# Patient Record
Sex: Female | Born: 1960
Health system: Southern US, Community
[De-identification: ages and names within clinical notes are randomized; demographics above are authoritative.]

## PROBLEM LIST (undated history)

## (undated) DIAGNOSIS — F17200 Nicotine dependence, unspecified, uncomplicated: Secondary | ICD-10-CM

## (undated) DIAGNOSIS — F419 Anxiety disorder, unspecified: Secondary | ICD-10-CM

## (undated) DIAGNOSIS — M545 Low back pain, unspecified: Secondary | ICD-10-CM

## (undated) DIAGNOSIS — G4730 Sleep apnea, unspecified: Secondary | ICD-10-CM

## (undated) DIAGNOSIS — M1990 Unspecified osteoarthritis, unspecified site: Secondary | ICD-10-CM

## (undated) DIAGNOSIS — E785 Hyperlipidemia, unspecified: Secondary | ICD-10-CM

## (undated) DIAGNOSIS — G8929 Other chronic pain: Secondary | ICD-10-CM

## (undated) DIAGNOSIS — I10 Essential (primary) hypertension: Secondary | ICD-10-CM

## (undated) DIAGNOSIS — K219 Gastro-esophageal reflux disease without esophagitis: Secondary | ICD-10-CM

## (undated) HISTORY — DX: Essential (primary) hypertension: I10

## (undated) HISTORY — PX: WISDOM TOOTH EXTRACTION: SHX21

## (undated) HISTORY — PX: TONSILLECTOMY: SUR1361

## (undated) HISTORY — PX: OTHER SURGICAL HISTORY: SHX169

## (undated) HISTORY — PX: BARTHOLIN GLAND CYST EXCISION: SHX565

## (undated) HISTORY — PX: BACK SURGERY: SHX140

## (undated) HISTORY — PX: NECK SURGERY: SHX720

## (undated) HISTORY — PX: SPINAL CORD DECOMPRESSION: SHX97

## (undated) HISTORY — DX: Hyperlipidemia, unspecified: E78.5

---

## 1999-01-06 ENCOUNTER — Ambulatory Visit (HOSPITAL_COMMUNITY): Admission: RE | Admit: 1999-01-06 | Discharge: 1999-01-06 | Payer: Self-pay | Admitting: Family Medicine

## 1999-08-04 ENCOUNTER — Encounter (INDEPENDENT_AMBULATORY_CARE_PROVIDER_SITE_OTHER): Payer: Self-pay

## 1999-08-04 ENCOUNTER — Observation Stay (HOSPITAL_COMMUNITY): Admission: RE | Admit: 1999-08-04 | Discharge: 1999-08-05 | Payer: Self-pay | Admitting: *Deleted

## 1999-12-25 ENCOUNTER — Other Ambulatory Visit: Admission: RE | Admit: 1999-12-25 | Discharge: 1999-12-25 | Payer: Self-pay | Admitting: Obstetrics and Gynecology

## 2000-01-20 ENCOUNTER — Encounter: Payer: Self-pay | Admitting: Family Medicine

## 2000-01-20 ENCOUNTER — Ambulatory Visit (HOSPITAL_COMMUNITY): Admission: RE | Admit: 2000-01-20 | Discharge: 2000-01-20 | Payer: Self-pay | Admitting: Family Medicine

## 2000-01-26 ENCOUNTER — Encounter: Payer: Self-pay | Admitting: Family Medicine

## 2000-01-26 ENCOUNTER — Encounter: Admission: RE | Admit: 2000-01-26 | Discharge: 2000-01-26 | Payer: Self-pay | Admitting: Family Medicine

## 2001-02-01 ENCOUNTER — Ambulatory Visit (HOSPITAL_COMMUNITY): Admission: RE | Admit: 2001-02-01 | Discharge: 2001-02-01 | Payer: Self-pay | Admitting: Family Medicine

## 2001-02-01 ENCOUNTER — Encounter: Payer: Self-pay | Admitting: Family Medicine

## 2001-11-01 ENCOUNTER — Other Ambulatory Visit: Admission: RE | Admit: 2001-11-01 | Discharge: 2001-11-01 | Payer: Self-pay | Admitting: Obstetrics and Gynecology

## 2002-02-02 ENCOUNTER — Ambulatory Visit (HOSPITAL_COMMUNITY): Admission: RE | Admit: 2002-02-02 | Discharge: 2002-02-02 | Payer: Self-pay | Admitting: Obstetrics and Gynecology

## 2002-02-02 ENCOUNTER — Encounter: Payer: Self-pay | Admitting: Obstetrics and Gynecology

## 2002-12-05 ENCOUNTER — Other Ambulatory Visit: Admission: RE | Admit: 2002-12-05 | Discharge: 2002-12-05 | Payer: Self-pay | Admitting: Obstetrics and Gynecology

## 2003-07-30 ENCOUNTER — Ambulatory Visit (HOSPITAL_COMMUNITY): Admission: RE | Admit: 2003-07-30 | Discharge: 2003-07-30 | Payer: Self-pay | Admitting: Obstetrics and Gynecology

## 2003-08-08 ENCOUNTER — Encounter: Admission: RE | Admit: 2003-08-08 | Discharge: 2003-08-08 | Payer: Self-pay | Admitting: Obstetrics and Gynecology

## 2003-12-27 ENCOUNTER — Ambulatory Visit (HOSPITAL_COMMUNITY): Admission: RE | Admit: 2003-12-27 | Discharge: 2003-12-27 | Payer: Self-pay | Admitting: Obstetrics

## 2004-08-14 ENCOUNTER — Ambulatory Visit (HOSPITAL_COMMUNITY): Admission: RE | Admit: 2004-08-14 | Discharge: 2004-08-14 | Payer: Self-pay | Admitting: Obstetrics

## 2005-08-16 ENCOUNTER — Ambulatory Visit (HOSPITAL_COMMUNITY): Admission: RE | Admit: 2005-08-16 | Discharge: 2005-08-16 | Payer: Self-pay | Admitting: Obstetrics

## 2006-04-19 ENCOUNTER — Ambulatory Visit (HOSPITAL_COMMUNITY): Admission: RE | Admit: 2006-04-19 | Discharge: 2006-04-19 | Payer: Self-pay | Admitting: Obstetrics

## 2006-08-22 ENCOUNTER — Ambulatory Visit (HOSPITAL_COMMUNITY): Admission: RE | Admit: 2006-08-22 | Discharge: 2006-08-22 | Payer: Self-pay | Admitting: Family Medicine

## 2006-11-14 ENCOUNTER — Ambulatory Visit (HOSPITAL_COMMUNITY): Admission: RE | Admit: 2006-11-14 | Discharge: 2006-11-14 | Payer: Self-pay | Admitting: Obstetrics

## 2007-11-17 ENCOUNTER — Ambulatory Visit (HOSPITAL_COMMUNITY): Admission: RE | Admit: 2007-11-17 | Discharge: 2007-11-17 | Payer: Self-pay | Admitting: Obstetrics

## 2008-08-29 ENCOUNTER — Encounter: Admission: RE | Admit: 2008-08-29 | Discharge: 2008-08-29 | Payer: Self-pay | Admitting: Neurosurgery

## 2008-11-18 ENCOUNTER — Ambulatory Visit (HOSPITAL_COMMUNITY): Admission: RE | Admit: 2008-11-18 | Discharge: 2008-11-18 | Payer: Self-pay | Admitting: Obstetrics

## 2009-11-19 ENCOUNTER — Ambulatory Visit (HOSPITAL_COMMUNITY): Admission: RE | Admit: 2009-11-19 | Discharge: 2009-11-19 | Payer: Self-pay | Admitting: Obstetrics

## 2009-11-25 ENCOUNTER — Encounter: Admission: RE | Admit: 2009-11-25 | Discharge: 2009-11-25 | Payer: Self-pay | Admitting: Obstetrics

## 2010-09-11 ENCOUNTER — Ambulatory Visit (HOSPITAL_COMMUNITY)
Admission: RE | Admit: 2010-09-11 | Discharge: 2010-09-11 | Payer: Self-pay | Source: Home / Self Care | Attending: Obstetrics | Admitting: Obstetrics

## 2010-10-18 ENCOUNTER — Encounter: Payer: Self-pay | Admitting: Obstetrics

## 2010-11-18 ENCOUNTER — Other Ambulatory Visit: Payer: Self-pay

## 2010-11-18 ENCOUNTER — Other Ambulatory Visit: Payer: Self-pay | Admitting: *Deleted

## 2010-11-18 DIAGNOSIS — Z1231 Encounter for screening mammogram for malignant neoplasm of breast: Secondary | ICD-10-CM

## 2010-11-27 ENCOUNTER — Ambulatory Visit (HOSPITAL_COMMUNITY)
Admission: RE | Admit: 2010-11-27 | Discharge: 2010-11-27 | Disposition: A | Discharge status: Home / Self Care | Payer: BC Managed Care – HMO | Source: Ambulatory Visit | Attending: Obstetrics | Admitting: Obstetrics

## 2010-11-27 ENCOUNTER — Ambulatory Visit: Payer: Self-pay

## 2010-11-27 DIAGNOSIS — Z1231 Encounter for screening mammogram for malignant neoplasm of breast: Secondary | ICD-10-CM | POA: Insufficient documentation

## 2010-12-07 LAB — CBC
HCT: 43.1 % (ref 36.0–46.0)
Hemoglobin: 15.1 g/dL — ABNORMAL HIGH (ref 12.0–15.0)
MCH: 33.1 pg (ref 26.0–34.0)
MCHC: 35 g/dL (ref 30.0–36.0)
MCV: 94.5 fL (ref 78.0–100.0)
Platelets: 313 10*3/uL (ref 150–400)
RBC: 4.56 MIL/uL (ref 3.87–5.11)
RDW: 11.7 % (ref 11.5–15.5)
WBC: 7.6 10*3/uL (ref 4.0–10.5)

## 2010-12-07 LAB — PREGNANCY, URINE: Preg Test, Ur: NEGATIVE

## 2011-10-26 ENCOUNTER — Other Ambulatory Visit (HOSPITAL_COMMUNITY): Payer: Self-pay | Admitting: Internal Medicine

## 2011-10-26 DIAGNOSIS — Z1231 Encounter for screening mammogram for malignant neoplasm of breast: Secondary | ICD-10-CM

## 2011-11-29 ENCOUNTER — Ambulatory Visit (HOSPITAL_COMMUNITY)
Admission: RE | Admit: 2011-11-29 | Discharge: 2011-11-29 | Disposition: A | Discharge status: Home / Self Care | Payer: BC Managed Care – HMO | Source: Ambulatory Visit | Attending: Internal Medicine | Admitting: Internal Medicine

## 2011-11-29 DIAGNOSIS — Z1231 Encounter for screening mammogram for malignant neoplasm of breast: Secondary | ICD-10-CM

## 2013-01-29 ENCOUNTER — Other Ambulatory Visit (HOSPITAL_COMMUNITY): Payer: Self-pay | Admitting: Internal Medicine

## 2013-01-29 DIAGNOSIS — Z1231 Encounter for screening mammogram for malignant neoplasm of breast: Secondary | ICD-10-CM

## 2013-02-06 ENCOUNTER — Ambulatory Visit (HOSPITAL_COMMUNITY)
Admission: RE | Admit: 2013-02-06 | Discharge: 2013-02-06 | Disposition: A | Discharge status: Home / Self Care | Payer: BC Managed Care – HMO | Source: Ambulatory Visit | Attending: Internal Medicine | Admitting: Internal Medicine

## 2013-02-06 DIAGNOSIS — Z1231 Encounter for screening mammogram for malignant neoplasm of breast: Secondary | ICD-10-CM | POA: Insufficient documentation

## 2013-03-03 ENCOUNTER — Ambulatory Visit (INDEPENDENT_AMBULATORY_CARE_PROVIDER_SITE_OTHER): Payer: BC Managed Care – PPO | Admitting: Family Medicine

## 2013-03-03 VITALS — BP 156/91 | HR 63 | Temp 98.2°F | Resp 16 | Ht 64.0 in | Wt 129.6 lb

## 2013-03-03 DIAGNOSIS — S61209A Unspecified open wound of unspecified finger without damage to nail, initial encounter: Secondary | ICD-10-CM

## 2013-03-03 DIAGNOSIS — M79609 Pain in unspecified limb: Secondary | ICD-10-CM

## 2013-03-03 DIAGNOSIS — M79646 Pain in unspecified finger(s): Secondary | ICD-10-CM | POA: Insufficient documentation

## 2013-03-03 DIAGNOSIS — S61219A Laceration without foreign body of unspecified finger without damage to nail, initial encounter: Secondary | ICD-10-CM | POA: Insufficient documentation

## 2013-03-03 NOTE — Progress Notes (Signed)
[facility] [address], [place] [address] [phone]  Date:  03/03/2013   Name:  Felicia Wilcox   DOB:  1961/09/23   MRN:  [id number]  PCP:  No PCP Per Patient    Chief Complaint: Laceration   History of Present Illness:  Felicia Wilcox is a 52 y.o. very pleasant female patient who presents with the following:  She stuck her hand into a drawer and cut her left ring finger on a cheese grater.  She is otherwise generally healthy and unhurt, her last tetanus shot was about 2 years ago.  She does have a PCP who is following her BP, no HA or CP  Patient Active Problem List   Diagnosis Date Noted  . Laceration of finger 03/03/2013  . Pain in finger 03/03/2013    No past medical history on file.  No past surgical history on file.  History  Substance Use Topics  . Smoking status: Not on file  . Smokeless tobacco: Not on file  . Alcohol Use: Not on file    No family history on file.  Allergies  Allergen Reactions  . Codeine   . Sulfa Antibiotics     Medication list has been reviewed and updated.  No current outpatient prescriptions on file prior to visit.   No current facility-administered medications on file prior to visit.    Review of Systems:  As per HPI- otherwise negative.   Physical Examination: Filed Vitals:   03/03/13 1849  BP: 156/91  Pulse: 63  Temp: 98.2 F (36.8 C)  Resp: 16   Filed Vitals:   03/03/13 1849  Height: 5\' 4"  (1.626 m)  Weight: 129 lb 9.6 oz (58.786 kg)   Body mass index is 22.23 kg/(m^2). Ideal Body Weight: Weight in (lb) to have BMI = 25: 145.3   GEN: WDWN, NAD, Non-toxic, Alert & Oriented x 3 HEENT: Atraumatic, Normocephalic.  Ears and Nose: No external deformity. EXTR: No clubbing/cyanosis/edema NEURO: Normal gait.  PSYCH: Normally interactive. Conversant. Not depressed or anxious appearing.  Calm demeanor.  Left ring finger: there is a clean, straight laceration lateral to the nail on the  lateral finger. Normal finger sensation on all sides, normal strength and ROM  Assessment and Plan: Laceration of finger, initial encounter  Pain in finger, left  Laceration of finger, repaired as per Rhoderick Moody, PA-C.  Wound dressed, she will RTC for SR or sooner if needed for any sign of infection or other complication.   Signed Abbe Amsterdam, MD

## 2013-03-03 NOTE — Patient Instructions (Signed)

## 2013-03-03 NOTE — Progress Notes (Signed)
Patient ID: TREZURE CRONK MRN: [id number], DOB: 05/09/1961, 52 y.o. Date of Encounter: 03/03/2013, 7:05 PM   PROCEDURE NOTE: Verbal consent obtained. Sterile technique employed. Numbing: Anesthesia obtained with 2% lidocaine plain.   Cleansed with soap and water. Irrigated.  Wound explored, no deep structures involved, no foreign bodies.   Wound repaired with # 6 SI sutures using 5-0 ethilon.  Hemostasis obtained. Wound cleansed and dressed.  Wound care instructions including precautions covered with patient. Handout given.  Anticipate suture removal in 7-10 days  Rhoderick Moody, PA-C 03/03/2013 7:05 PM

## 2013-03-06 ENCOUNTER — Telehealth: Payer: Self-pay | Admitting: *Deleted

## 2013-03-06 NOTE — Telephone Encounter (Signed)
Patient called and left voice message requesting a oral rx  For bacterial infection. She states there was some improvement with the rx for the cream and she does not want a rx for oral medication. Her last office visit was with Almyra Free in April of 2013 she is scheduled for a annual exam on 7/3/204 @ 1:30 with Dr Clearance Coots. Please advise.

## 2013-03-06 NOTE — Telephone Encounter (Signed)
Patient called and left voice message requesting a oral rx for bacterial inection. She states the previous rx provided in

## 2013-03-08 NOTE — Telephone Encounter (Signed)
SEE CALL NOTE.

## 2013-03-09 NOTE — Telephone Encounter (Signed)
OK to refill

## 2013-03-13 ENCOUNTER — Ambulatory Visit (INDEPENDENT_AMBULATORY_CARE_PROVIDER_SITE_OTHER): Payer: BC Managed Care – PPO | Admitting: Emergency Medicine

## 2013-03-13 VITALS — Ht 64.0 in | Wt 124.0 lb

## 2013-03-13 DIAGNOSIS — Z4889 Encounter for other specified surgical aftercare: Secondary | ICD-10-CM

## 2013-03-13 NOTE — Progress Notes (Signed)
[facility] [address], [place] [address] [phone]  Date:  03/13/2013   Name:  Felicia Wilcox   DOB:  1961/09/13   MRN:  [id number]  PCP:  No PCP Per Patient    Chief Complaint: Suture / Staple Removal   History of Present Illness:  Felicia Wilcox is a 52 y.o. very pleasant female patient who presents with the following:  Sutured left second finger a week ago.  Interval history is negative.  Denies other complaint or health concern today.   Patient Active Problem List   Diagnosis Date Noted  . Laceration of finger 03/03/2013  . Pain in finger 03/03/2013    No past medical history on file.  No past surgical history on file.  History  Substance Use Topics  . Smoking status: Not on file  . Smokeless tobacco: Not on file  . Alcohol Use: Not on file    No family history on file.  Allergies  Allergen Reactions  . Codeine   . Sulfa Antibiotics     Medication list has been reviewed and updated.  Current Outpatient Prescriptions on File Prior to Visit  Medication Sig Dispense Refill  . Escitalopram Oxalate (LEXAPRO PO) Take 1 tablet by mouth daily.       No current facility-administered medications on file prior to visit.    Review of Systems:  As per HPI, otherwise negative.    Physical Examination: There were no vitals filed for this visit. Filed Vitals:   03/13/13 1053  Height: 5\' 4"  (1.626 m)  Weight: 124 lb (56.246 kg)   Body mass index is 21.27 kg/(m^2). Ideal Body Weight: Weight in (lb) to have BMI = 25: 145.3   GEN: WDWN, NAD, Non-toxic, Alert & Oriented x 3 HEENT: Atraumatic, Normocephalic.  Ears and Nose: No external deformity. EXTR: No clubbing/cyanosis/edema NEURO: Normal gait.  PSYCH: Normally interactive. Conversant. Not depressed or anxious appearing.  Calm demeanor.  SKIN:  Wound healed.  NATI  Assessment and Plan: Suture removal Follow up as needed    Signed,  Phillips Odor, MD

## 2013-03-29 ENCOUNTER — Ambulatory Visit: Payer: Self-pay | Admitting: Obstetrics

## 2013-05-17 ENCOUNTER — Ambulatory Visit (INDEPENDENT_AMBULATORY_CARE_PROVIDER_SITE_OTHER): Payer: BC Managed Care – PPO | Admitting: Obstetrics

## 2013-05-17 ENCOUNTER — Encounter: Payer: Self-pay | Admitting: Obstetrics

## 2013-05-17 VITALS — BP 147/91 | HR 77 | Temp 99.0°F | Ht 63.0 in | Wt 128.2 lb

## 2013-05-17 DIAGNOSIS — N760 Acute vaginitis: Secondary | ICD-10-CM | POA: Insufficient documentation

## 2013-05-17 DIAGNOSIS — Z01419 Encounter for gynecological examination (general) (routine) without abnormal findings: Secondary | ICD-10-CM

## 2013-05-17 DIAGNOSIS — Z0000 Encounter for general adult medical examination without abnormal findings: Secondary | ICD-10-CM

## 2013-05-17 NOTE — Addendum Note (Signed)
Addended by: Elby Beck F on: 05/17/2013 03:54 PM   Modules accepted: Orders

## 2013-05-17 NOTE — Progress Notes (Signed)
Subjective:     Felicia Wilcox is a 52 y.o. female here for a routine exam.  Current complaints: annual exam. Pt states she is getting reoccurring bacterial infections.    Personal health questionnaire reviewed: yes.   Gynecologic History No LMP recorded. Patient is postmenopausal. Contraception: abstinence Last Pap: 12/2011. Results were: normal Last mammogram: 12/2012. Results were: normal  Obstetric History OB History  No data available     The following portions of the patient's history were reviewed and updated as appropriate: allergies, current medications, past family history, past medical history, past social history, past surgical history and problem list.  Review of Systems Pertinent items are noted in HPI.    Objective:    General appearance: alert and no distress Breasts: normal appearance, no masses or tenderness, Inspection negative, Normal to palpation without dominant masses, Taught monthly breast self examination Abdomen: normal findings: soft, non-tender Pelvic: cervix normal in appearance, external genitalia normal, no adnexal masses or tenderness, no cervical motion tenderness, uterus normal size, shape, and consistency and vagina normal without discharge    Assessment:    Healthy female exam.    Plan:    Follow up in: 1 year.  \

## 2013-05-18 LAB — WET PREP BY MOLECULAR PROBE
Candida species: NEGATIVE
Gardnerella vaginalis: NEGATIVE
Trichomonas vaginosis: NEGATIVE

## 2013-05-18 LAB — PAP IG W/ RFLX HPV ASCU

## 2014-01-10 ENCOUNTER — Encounter: Payer: Self-pay | Admitting: Obstetrics

## 2014-02-22 ENCOUNTER — Telehealth: Payer: Self-pay | Admitting: *Deleted

## 2014-02-22 NOTE — Telephone Encounter (Signed)
Patient called stating that she has a bacterial infection. Patient states last time she was treated with an antibiotic that she did not do well with. Patient state she would like a vaginal cream sent to [facility] on [address] [address]. Patient states she is leaving to go out of town Sunday.   Tried to contact the patient and was sent to voicemail, left a message for patient to contact the office.

## 2014-02-26 NOTE — Telephone Encounter (Signed)
Patient never called the office. Patient should be out of town now. Call refilled.

## 2014-03-12 ENCOUNTER — Telehealth: Payer: Self-pay | Admitting: *Deleted

## 2014-03-12 DIAGNOSIS — N760 Acute vaginitis: Principal | ICD-10-CM

## 2014-03-12 DIAGNOSIS — B9689 Other specified bacterial agents as the cause of diseases classified elsewhere: Secondary | ICD-10-CM

## 2014-03-12 MED ORDER — METRONIDAZOLE 0.75 % VA GEL: 1.0000 | Freq: Two times a day (BID) | VAGINAL | Status: DC

## 2014-03-12 NOTE — Telephone Encounter (Signed)
Patient state she has chronic BV problems and her doctor is aware. Patient is requesting vaginal treatment as she can not take the normal oral medications. Patient states she called 2 weeks ago and did not get a call back. Rx escibed to pharmacy per Dr Clearance CootsHarper.

## 2014-08-08 ENCOUNTER — Encounter: Payer: Self-pay | Admitting: Obstetrics & Gynecology

## 2014-08-08 ENCOUNTER — Ambulatory Visit (INDEPENDENT_AMBULATORY_CARE_PROVIDER_SITE_OTHER): Payer: BC Managed Care – PPO | Admitting: Obstetrics & Gynecology

## 2014-08-08 VITALS — BP 143/98 | HR 93 | Wt 133.0 lb

## 2014-08-08 DIAGNOSIS — Z01419 Encounter for gynecological examination (general) (routine) without abnormal findings: Secondary | ICD-10-CM

## 2014-08-08 NOTE — Progress Notes (Signed)
Subjective:     Felicia BangsCheryl M Wilcox is a 53 y.o. female here for a routine exam.    Personal health questionnaire:  Is patient Ashkenazi Jewish, have a family history of breast and/or ovarian cancer: no Is there a family history of uterine cancer diagnosed at age < 4850, gastrointestinal cancer, urinary tract cancer, family member who is a Personnel officerLynch syndrome-associated carrier: no Is the patient overweight and hypertensive, family history of diabetes, personal history of gestational diabetes or PCOS: yes Is patient over 2555, have PCOS,  family history of premature CHD under age 55, diabetes, smoke, have hypertension or peripheral artery disease:  yes At any time, has a partner hit, kicked or otherwise hurt or frightened you?: no Over the past 2 weeks, have you felt down, depressed or hopeless?: yes Over the past 2 weeks, have you felt little interest or pleasure in doing things?:sometimes   Gynecologic History No LMP recorded. Patient is postmenopausal.  Last Pap: 2014. Results were: normal Last mammogram: 2014. Results were: normal  Obstetric History OB History  No data available    History reviewed. No pertinent past medical history.  Past Surgical History  Procedure Laterality Date  . Abalation     . Neck surgery      Current outpatient prescriptions: buPROPion (WELLBUTRIN XL) 150 MG 24 hr tablet, Take 150 mg by mouth daily., Disp: , Rfl: ;  Escitalopram Oxalate (LEXAPRO PO), Take 1 tablet by mouth daily., Disp: , Rfl: ;  metroNIDAZOLE (METROGEL VAGINAL) 0.75 % vaginal gel, Place 1 Applicatorful vaginally 2 (two) times daily., Disp: 70 g, Rfl: 2 Allergies  Allergen Reactions  . Codeine   . Sulfa Antibiotics     History  Substance Use Topics  . Smoking status: Current Every Day Smoker -- 0.50 packs/day  . Smokeless tobacco: Not on file  . Alcohol Use: No    Family History  Problem Relation Age of Onset  . Heart disease Mother   . Cancer Father   . Diabetes Maternal Grandmother        Review of Systems  Constitutional: negative for fatigue and weight loss Respiratory: negative for cough and wheezing Cardiovascular: negative for chest pain, fatigue and palpitations Gastrointestinal: negative for abdominal pain and change in bowel habits Musculoskeletal:negative for myalgias Neurological: negative for gait problems and tremors Behavioral/Psych: negative for abusive relationship, depression Endocrine: negative for temperature intolerance   Genitourinary:negative for abnormal menstrual periods, genital lesions, hot flashes, sexual problems and vaginal discharge Integument/breast: negative for breast lump, breast tenderness, nipple discharge and skin lesion(s)    Objective:       BP 143/98 mmHg  Pulse 93  Wt 60.328 kg (133 lb) General:   alert  Skin:   no rash or abnormalities  Lungs:   clear to auscultation bilaterally  Heart:   regular rate and rhythm, S1, S2 normal, no murmur, click, rub or gallop  Breasts:   normal without suspicious masses, skin or nipple changes or axillary nodes  Abdomen:  normal findings: no organomegaly, soft, non-tender and no hernia  Pelvis:  External genitalia: normal general appearance Urinary system: urethral meatus normal and bladder without fullness, nontender Vaginal: normal without tenderness, induration or masses Cervix: normal appearance Adnexa: normal bimanual exam Uterus: anteverted and non-tender, normal size   Lab Review Urine pregnancy test Labs reviewed: no Radiologic studies reviewed: yes    Assessment:    Healthy female exam.    Plan:    Education reviewed: calcium supplements, low fat, low cholesterol diet  and weight bearing exercise.   Follow up as needed.

## 2014-08-10 LAB — PAP IG, CT-NG, RFX HPV ASCU
Chlamydia Probe Amp: NEGATIVE
GC Probe Amp: NEGATIVE

## 2014-08-14 NOTE — Patient Instructions (Signed)
Bone Health Our bones do many things. They provide structure, protect organs, anchor muscles, and store calcium. Adequate calcium in your diet and weight-bearing physical activity help build strong bones, improve bone amounts, and may reduce the risk of weakening of bones (osteoporosis) later in life. PEAK BONE MASS By age 55, the average woman has acquired most of her skeletal bone mass. A large decline occurs in older adults which increases the risk of osteoporosis. In women this occurs around the time of menopause. It is important for young girls to reach their peak bone mass in order to maintain bone health throughout life. A person with high bone mass as a young adult will be more likely to have a higher bone mass later in life. Not enough calcium consumption and physical activity early on could result in a failure to achieve optimum bone mass in adulthood. OSTEOPOROSIS Osteoporosis is a disease of the bones. It is defined as low bone mass with deterioration of bone structure. Osteoporosis leads to an increase risk of fractures with falls. These fractures commonly happen in the wrist, hip, and spine. While men and women of all ages and background can develop osteoporosis, some of the risk factors for osteoporosis are:  Female.  White.  Postmenopausal.  Older adults.  Small in body size.  Eating a diet low in calcium.  Physically inactive.  Smoking.  Use of some medications.  Family history. CALCIUM Calcium is a mineral needed by the body for healthy bones, teeth, and proper function of the heart, muscles, and nerves. The body cannot produce calcium so it must be absorbed through food. Good sources of calcium include:  Dairy products (low fat or nonfat milk, cheese, and yogurt).  Dark green leafy vegetables (bok choy and broccoli).  Calcium fortified foods (orange juice, cereal, bread, soy beverages, and tofu products).  Nuts (almonds). Recommended amounts of calcium vary  for individuals. RECOMMENDED CALCIUM INTAKES Age and Amount in mg per day  Children 1 to 3 years / 700 mg  Children 4 to 8 years / 1,000 mg  Children 9 to 13 years / 1,300 mg  Teens 14 to 18 years / 1,300 mg  Adults 19 to 50 years / 1,000 mg  Adult women 51 to 70 years / 1,200 mg  Adults 71 years and older / 1,200 mg  Pregnant and breastfeeding teens / 1,300 mg  Pregnant and breastfeeding adults / 1,000 mg Vitamin D also plays an important role in healthy bone development. Vitamin D helps in the absorption of calcium. WEIGHT-BEARING PHYSICAL ACTIVITY Regular physical activity has many positive health benefits. Benefits include strong bones. Weight-bearing physical activity early in life is important in reaching peak bone mass. Weight-bearing physical activities cause muscles and bones to work against gravity. Some examples of weight bearing physical activities include:  Walking, jogging, or running.  Field Hockey.  Jumping rope.  Dancing.  Soccer.  Tennis or Racquetball.  Stair climbing.  Basketball.  Hiking.  Weight lifting.  Aerobic fitness classes. Including weight-bearing physical activity into an exercise plan is a great way to keep bones healthy. Adults: Engage in at least 30 minutes of moderate physical activity on most, preferably all, days of the week. Children: Engage in at least 60 minutes of moderate physical activity on most, preferably all, days of the week. FOR MORE INFORMATION [place] Department of Agriculture, Center for Nutrition Policy and Promotion: www.cnpp.usda.gov National Osteoporosis Foundation: www.nof.org Document Released: 12/04/2003 Document Revised: 01/08/2013 Document Reviewed: 03/05/2009 [employer] Patient Information   2015 [employer]. This information is not intended to replace advice given to you by your health care provider. Make sure you discuss any questions you have with your health care provider. Health  Maintenance Adopting a healthy lifestyle and getting preventive care can go a long way to promote health and wellness. Talk with your health care provider about what schedule of regular examinations is right for you. This is a good chance for you to check in with your provider about disease prevention and staying healthy. In between checkups, there are plenty of things you can do on your own. Experts have done a lot of research about which lifestyle changes and preventive measures are most likely to keep you healthy. Ask your health care provider for more information. WEIGHT AND DIET  Eat a healthy diet  Be sure to include plenty of vegetables, fruits, low-fat dairy products, and lean protein.  Do not eat a lot of foods high in solid fats, added sugars, or salt.  Get regular exercise. This is one of the most important things you can do for your health.  Most adults should exercise for at least 150 minutes each week. The exercise should increase your heart rate and make you sweat (moderate-intensity exercise).  Most adults should also do strengthening exercises at least twice a week. This is in addition to the moderate-intensity exercise.  Maintain a healthy weight  Body mass index (BMI) is a measurement that can be used to identify possible weight problems. It estimates body fat based on height and weight. Your health care provider can help determine your BMI and help you achieve or maintain a healthy weight.  For females 55 years of age and older:   A BMI below 18.5 is considered underweight.  A BMI of 18.5 to 24.9 is normal.  A BMI of 25 to 29.9 is considered overweight.  A BMI of 30 and above is considered obese.  Watch levels of cholesterol and blood lipids  You should start having your blood tested for lipids and cholesterol at 55 years of age, then have this test every 5 years.  You may need to have your cholesterol levels checked more often if:  Your lipid or cholesterol  levels are high.  You are older than 55 years of age.  You are at high risk for heart disease.  CANCER SCREENING   Lung Cancer  Lung cancer screening is recommended for adults 55-80 years old who are at high risk for lung cancer because of a history of smoking.  A yearly low-dose CT scan of the lungs is recommended for people who:  Currently smoke.  Have quit within the past 15 years.  Have at least a 30-pack-year history of smoking. A pack year is smoking an average of one pack of cigarettes a day for 1 year.  Yearly screening should continue until it has been 15 years since you quit.  Yearly screening should stop if you develop a health problem that would prevent you from having lung cancer treatment.  Breast Cancer  Practice breast self-awareness. This means understanding how your breasts normally appear and feel.  It also means doing regular breast self-exams. Let your health care provider know about any changes, no matter how small.  If you are in your 20s or 30s, you should have a clinical breast exam (CBE) by a health care provider every 1-3 years as part of a regular health exam.  If you are 40 or older, have a CBE every   year. Also consider having a breast X-ray (mammogram) every year.  If you have a family history of breast cancer, talk to your health care provider about genetic screening.  If you are at high risk for breast cancer, talk to your health care provider about having an MRI and a mammogram every year.  Breast cancer gene (BRCA) assessment is recommended for women who have family members with BRCA-related cancers. BRCA-related cancers include:  Breast.  Ovarian.  Tubal.  Peritoneal cancers.  Results of the assessment will determine the need for genetic counseling and BRCA1 and BRCA2 testing. Cervical Cancer Routine pelvic examinations to screen for cervical cancer are no longer recommended for nonpregnant women who are considered low risk for  cancer of the pelvic organs (ovaries, uterus, and vagina) and who do not have symptoms. A pelvic examination may be necessary if you have symptoms including those associated with pelvic infections. Ask your health care provider if a screening pelvic exam is right for you.   The Pap test is the screening test for cervical cancer for women who are considered at risk.  If you had a hysterectomy for a problem that was not cancer or a condition that could lead to cancer, then you no longer need Pap tests.  If you are older than 65 years, and you have had normal Pap tests for the past 10 years, you no longer need to have Pap tests.  If you have had past treatment for cervical cancer or a condition that could lead to cancer, you need Pap tests and screening for cancer for at least 20 years after your treatment.  If you no longer get a Pap test, assess your risk factors if they change (such as having a new sexual partner). This can affect whether you should start being screened again.  Some women have medical problems that increase their chance of getting cervical cancer. If this is the case for you, your health care provider may recommend more frequent screening and Pap tests.  The human papillomavirus (HPV) test is another test that may be used for cervical cancer screening. The HPV test looks for the virus that can cause cell changes in the cervix. The cells collected during the Pap test can be tested for HPV.  The HPV test can be used to screen women 30 years of age and older. Getting tested for HPV can extend the interval between normal Pap tests from three to five years.  An HPV test also should be used to screen women of any age who have unclear Pap test results.  After 55 years of age, women should have HPV testing as often as Pap tests.  Colorectal Cancer  This type of cancer can be detected and often prevented.  Routine colorectal cancer screening usually begins at 55 years of age and  continues through 55 years of age.  Your health care provider may recommend screening at an earlier age if you have risk factors for colon cancer.  Your health care provider may also recommend using home test kits to check for hidden blood in the stool.  A small camera at the end of a tube can be used to examine your colon directly (sigmoidoscopy or colonoscopy). This is done to check for the earliest forms of colorectal cancer.  Routine screening usually begins at age 50.  Direct examination of the colon should be repeated every 5-10 years through 55 years of age. However, you may need to be screened more often if early   forms of precancerous polyps or small growths are found. Skin Cancer  Check your skin from head to toe regularly.  Tell your health care provider about any new moles or changes in moles, especially if there is a change in a mole's shape or color.  Also tell your health care provider if you have a mole that is larger than the size of a pencil eraser.  Always use sunscreen. Apply sunscreen liberally and repeatedly throughout the day.  Protect yourself by wearing long sleeves, pants, a wide-brimmed hat, and sunglasses whenever you are outside. HEART DISEASE, DIABETES, AND HIGH BLOOD PRESSURE   Have your blood pressure checked at least every 1-2 years. High blood pressure causes heart disease and increases the risk of stroke.  If you are between 55 years and 79 years old, ask your health care provider if you should take aspirin to prevent strokes.  Have regular diabetes screenings. This involves taking a blood sample to check your fasting blood sugar level.  If you are at a normal weight and have a low risk for diabetes, have this test once every three years after 55 years of age.  If you are overweight and have a high risk for diabetes, consider being tested at a younger age or more often. PREVENTING INFECTION  Hepatitis B  If you have a higher risk for hepatitis B,  you should be screened for this virus. You are considered at high risk for hepatitis B if:  You were born in a country where hepatitis B is common. Ask your health care provider which countries are considered high risk.  Your parents were born in a high-risk country, and you have not been immunized against hepatitis B (hepatitis B vaccine).  You have HIV or AIDS.  You use needles to inject street drugs.  You live with someone who has hepatitis B.  You have had sex with someone who has hepatitis B.  You get hemodialysis treatment.  You take certain medicines for conditions, including cancer, organ transplantation, and autoimmune conditions. Hepatitis C  Blood testing is recommended for:  Everyone born from 1945 through 1965.  Anyone with known risk factors for hepatitis C. Sexually transmitted infections (STIs)  You should be screened for sexually transmitted infections (STIs) including gonorrhea and chlamydia if:  You are sexually active and are younger than 55 years of age.  You are older than 55 years of age and your health care provider tells you that you are at risk for this type of infection.  Your sexual activity has changed since you were last screened and you are at an increased risk for chlamydia or gonorrhea. Ask your health care provider if you are at risk.  If you do not have HIV, but are at risk, it may be recommended that you take a prescription medicine daily to prevent HIV infection. This is called pre-exposure prophylaxis (PrEP). You are considered at risk if:  You are sexually active and do not regularly use condoms or know the HIV status of your partner(s).  You take drugs by injection.  You are sexually active with a partner who has HIV. Talk with your health care provider about whether you are at high risk of being infected with HIV. If you choose to begin PrEP, you should first be tested for HIV. You should then be tested every 3 months for as long as  you are taking PrEP.  PREGNANCY   If you are premenopausal and you may become pregnant, ask your   health care provider about preconception counseling.  If you may become pregnant, take 400 to 800 micrograms (mcg) of folic acid every day.  If you want to prevent pregnancy, talk to your health care provider about birth control (contraception). OSTEOPOROSIS AND MENOPAUSE   Osteoporosis is a disease in which the bones lose minerals and strength with aging. This can result in serious bone fractures. Your risk for osteoporosis can be identified using a bone density scan.  If you are 51 years of age or older, or if you are at risk for osteoporosis and fractures, ask your health care provider if you should be screened.  Ask your health care provider whether you should take a calcium or vitamin D supplement to lower your risk for osteoporosis.  Menopause may have certain physical symptoms and risks.  Hormone replacement therapy may reduce some of these symptoms and risks. Talk to your health care provider about whether hormone replacement therapy is right for you.  HOME CARE INSTRUCTIONS   Schedule regular health, dental, and eye exams.  Stay current with your immunizations.   Do not use any tobacco products including cigarettes, chewing tobacco, or electronic cigarettes.  If you are pregnant, do not drink alcohol.  If you are breastfeeding, limit how much and how often you drink alcohol.  Limit alcohol intake to no more than 1 drink per day for nonpregnant women. One drink equals 12 ounces of beer, 5 ounces of wine, or 1 ounces of hard liquor.  Do not use street drugs.  Do not share needles.  Ask your health care provider for help if you need support or information about quitting drugs.  Tell your health care provider if you often feel depressed.  Tell your health care provider if you have ever been abused or do not feel safe at home. Document Released: 03/29/2011 Document  Revised: 01/28/2014 Document Reviewed: 08/15/2013 [facility] Patient Information 2015 [place], [place]. This information is not intended to replace advice given to you by your health care provider. Make sure you discuss any questions you have with your health care provider.

## 2014-08-23 ENCOUNTER — Telehealth: Payer: Self-pay | Admitting: *Deleted

## 2014-08-23 NOTE — Telephone Encounter (Signed)
Patient interested in an IUD Removal. Patient scheduled for 08-28-14 @ 4:15p.

## 2014-08-28 ENCOUNTER — Ambulatory Visit: Payer: Self-pay | Admitting: Obstetrics

## 2014-08-30 ENCOUNTER — Ambulatory Visit: Payer: Self-pay | Admitting: Obstetrics

## 2014-09-02 ENCOUNTER — Telehealth: Payer: Self-pay | Admitting: *Deleted

## 2014-09-02 NOTE — Telephone Encounter (Signed)
Patient called to reschedule her IUD Removal. Patient was scheduled for 09-16-14 @ 4:15p.

## 2014-09-06 ENCOUNTER — Ambulatory Visit: Payer: BC Managed Care – PPO | Admitting: Obstetrics

## 2014-09-11 ENCOUNTER — Encounter: Payer: Self-pay | Admitting: Obstetrics & Gynecology

## 2014-09-11 ENCOUNTER — Other Ambulatory Visit: Payer: Self-pay | Admitting: *Deleted

## 2014-09-11 ENCOUNTER — Ambulatory Visit (INDEPENDENT_AMBULATORY_CARE_PROVIDER_SITE_OTHER): Payer: BC Managed Care – PPO | Admitting: Obstetrics & Gynecology

## 2014-09-11 VITALS — BP 152/95 | HR 90 | Temp 99.3°F | Ht 63.0 in | Wt 135.0 lb

## 2014-09-11 DIAGNOSIS — Z30432 Encounter for removal of intrauterine contraceptive device: Secondary | ICD-10-CM

## 2014-09-11 DIAGNOSIS — N882 Stricture and stenosis of cervix uteri: Secondary | ICD-10-CM

## 2014-09-11 MED ORDER — MISOPROSTOL 200 MCG PO TABS: 200.0000 ug | ORAL_TABLET | Freq: Once | ORAL | Status: DC

## 2014-09-12 ENCOUNTER — Encounter (HOSPITAL_COMMUNITY): Payer: Self-pay | Admitting: *Deleted

## 2014-09-13 ENCOUNTER — Ambulatory Visit (HOSPITAL_COMMUNITY): Payer: BC Managed Care – PPO

## 2014-09-13 ENCOUNTER — Encounter (HOSPITAL_COMMUNITY)
Admission: RE | Disposition: A | Discharge status: Home / Self Care | Payer: Self-pay | Source: Ambulatory Visit | Attending: Obstetrics & Gynecology

## 2014-09-13 ENCOUNTER — Ambulatory Visit (HOSPITAL_COMMUNITY): Payer: BC Managed Care – PPO | Admitting: Anesthesiology

## 2014-09-13 ENCOUNTER — Ambulatory Visit (HOSPITAL_COMMUNITY)
Admission: RE | Admit: 2014-09-13 | Discharge: 2014-09-13 | Disposition: A | Discharge status: Home / Self Care | Payer: BC Managed Care – PPO | Source: Ambulatory Visit | Attending: Obstetrics & Gynecology | Admitting: Obstetrics & Gynecology

## 2014-09-13 DIAGNOSIS — M1990 Unspecified osteoarthritis, unspecified site: Secondary | ICD-10-CM | POA: Insufficient documentation

## 2014-09-13 DIAGNOSIS — K219 Gastro-esophageal reflux disease without esophagitis: Secondary | ICD-10-CM | POA: Diagnosis not present

## 2014-09-13 DIAGNOSIS — G8929 Other chronic pain: Secondary | ICD-10-CM | POA: Diagnosis not present

## 2014-09-13 DIAGNOSIS — Z30432 Encounter for removal of intrauterine contraceptive device: Secondary | ICD-10-CM | POA: Diagnosis present

## 2014-09-13 DIAGNOSIS — Z975 Presence of (intrauterine) contraceptive device: Secondary | ICD-10-CM | POA: Diagnosis not present

## 2014-09-13 DIAGNOSIS — F17210 Nicotine dependence, cigarettes, uncomplicated: Secondary | ICD-10-CM | POA: Diagnosis not present

## 2014-09-13 DIAGNOSIS — M545 Low back pain: Secondary | ICD-10-CM | POA: Diagnosis not present

## 2014-09-13 DIAGNOSIS — G4730 Sleep apnea, unspecified: Secondary | ICD-10-CM | POA: Diagnosis not present

## 2014-09-13 DIAGNOSIS — Z1889 Other specified retained foreign body fragments: Secondary | ICD-10-CM | POA: Diagnosis not present

## 2014-09-13 HISTORY — DX: Sleep apnea, unspecified: G47.30

## 2014-09-13 HISTORY — DX: Low back pain: M54.5

## 2014-09-13 HISTORY — DX: Nicotine dependence, unspecified, uncomplicated: F17.200

## 2014-09-13 HISTORY — DX: Gastro-esophageal reflux disease without esophagitis: K21.9

## 2014-09-13 HISTORY — DX: Unspecified osteoarthritis, unspecified site: M19.90

## 2014-09-13 HISTORY — DX: Low back pain, unspecified: M54.50

## 2014-09-13 HISTORY — DX: Anxiety disorder, unspecified: F41.9

## 2014-09-13 HISTORY — PX: HYSTEROSCOPY: SHX211

## 2014-09-13 HISTORY — DX: Other chronic pain: G89.29

## 2014-09-13 LAB — CBC
HCT: 42.2 % (ref 36.0–46.0)
Hemoglobin: 14.8 g/dL (ref 12.0–15.0)
MCH: 33.9 pg (ref 26.0–34.0)
MCHC: 35.1 g/dL (ref 30.0–36.0)
MCV: 96.6 fL (ref 78.0–100.0)
Platelets: 236 10*3/uL (ref 150–400)
RBC: 4.37 MIL/uL (ref 3.87–5.11)
RDW: 11.8 % (ref 11.5–15.5)
WBC: 5.8 10*3/uL (ref 4.0–10.5)

## 2014-09-13 SURGERY — HYSTEROSCOPY
Anesthesia: General | Site: Vagina

## 2014-09-13 MED ORDER — DEXAMETHASONE SODIUM PHOSPHATE 10 MG/ML IJ SOLN
INTRAMUSCULAR | Status: AC
  Filled 2014-09-13: qty 1

## 2014-09-13 MED ORDER — LIDOCAINE HCL 1 % IJ SOLN
INTRAMUSCULAR | Status: DC | PRN
  Administered 2014-09-13: 10 mL

## 2014-09-13 MED ORDER — SODIUM CHLORIDE 0.9 % IR SOLN
Status: DC | PRN
  Administered 2014-09-13: 3000 mL

## 2014-09-13 MED ORDER — LACTATED RINGERS IV SOLN
INTRAVENOUS | Status: DC
  Administered 2014-09-13: 13:00:00 via INTRAVENOUS

## 2014-09-13 MED ORDER — PROPOFOL 10 MG/ML IV EMUL
INTRAVENOUS | Status: AC
  Filled 2014-09-13: qty 20

## 2014-09-13 MED ORDER — ONDANSETRON HCL 4 MG/2ML IJ SOLN
INTRAMUSCULAR | Status: AC
  Filled 2014-09-13: qty 2

## 2014-09-13 MED ORDER — ONDANSETRON HCL 4 MG/2ML IJ SOLN
INTRAMUSCULAR | Status: DC | PRN
  Administered 2014-09-13: 4 mg via INTRAVENOUS

## 2014-09-13 MED ORDER — LIDOCAINE HCL 1 % IJ SOLN
INTRAMUSCULAR | Status: AC
  Filled 2014-09-13: qty 20

## 2014-09-13 MED ORDER — FENTANYL CITRATE 0.05 MG/ML IJ SOLN
INTRAMUSCULAR | Status: DC | PRN
  Administered 2014-09-13: 50 ug via INTRAVENOUS
  Administered 2014-09-13 (×2): 100 ug via INTRAVENOUS

## 2014-09-13 MED ORDER — PROPOFOL 10 MG/ML IV BOLUS
INTRAVENOUS | Status: DC | PRN
  Administered 2014-09-13: 200 mg via INTRAVENOUS

## 2014-09-13 MED ORDER — LIDOCAINE HCL (CARDIAC) 20 MG/ML IV SOLN
INTRAVENOUS | Status: DC | PRN
  Administered 2014-09-13: 80 mg via INTRAVENOUS

## 2014-09-13 MED ORDER — DEXAMETHASONE SODIUM PHOSPHATE 4 MG/ML IJ SOLN
INTRAMUSCULAR | Status: DC | PRN
  Administered 2014-09-13: 4 mg via INTRAVENOUS

## 2014-09-13 MED ORDER — FENTANYL CITRATE 0.05 MG/ML IJ SOLN
INTRAMUSCULAR | Status: AC
  Filled 2014-09-13: qty 5

## 2014-09-13 MED ORDER — SCOPOLAMINE 1 MG/3DAYS TD PT72
1.0000 | MEDICATED_PATCH | Freq: Once | TRANSDERMAL | Status: DC
  Administered 2014-09-13: 1.5 mg via TRANSDERMAL

## 2014-09-13 MED ORDER — MIDAZOLAM HCL 2 MG/2ML IJ SOLN
INTRAMUSCULAR | Status: DC | PRN
  Administered 2014-09-13: 2 mg via INTRAVENOUS

## 2014-09-13 MED ORDER — KETOROLAC TROMETHAMINE 30 MG/ML IJ SOLN
INTRAMUSCULAR | Status: DC | PRN
  Administered 2014-09-13: 30 mg via INTRAVENOUS

## 2014-09-13 MED ORDER — SCOPOLAMINE 1 MG/3DAYS TD PT72
MEDICATED_PATCH | TRANSDERMAL | Status: AC
  Administered 2014-09-13: 1.5 mg via TRANSDERMAL
  Filled 2014-09-13: qty 1

## 2014-09-13 MED ORDER — GLYCOPYRROLATE 0.2 MG/ML IJ SOLN
INTRAMUSCULAR | Status: AC
  Filled 2014-09-13: qty 1

## 2014-09-13 MED ORDER — KETOROLAC TROMETHAMINE 30 MG/ML IJ SOLN
INTRAMUSCULAR | Status: AC
  Filled 2014-09-13: qty 1

## 2014-09-13 MED ORDER — MIDAZOLAM HCL 2 MG/2ML IJ SOLN
INTRAMUSCULAR | Status: AC
  Filled 2014-09-13: qty 2

## 2014-09-13 MED ORDER — LIDOCAINE HCL (CARDIAC) 20 MG/ML IV SOLN
INTRAVENOUS | Status: AC
  Filled 2014-09-13: qty 5

## 2014-09-13 MED ORDER — GLYCOPYRROLATE 0.2 MG/ML IJ SOLN
INTRAMUSCULAR | Status: DC | PRN
  Administered 2014-09-13: 0.1 mg via INTRAVENOUS

## 2014-09-13 SURGICAL SUPPLY — 17 items
CANISTER SUCT 3000ML (MISCELLANEOUS) ×2 IMPLANT
CATH ROBINSON RED A/P 16FR (CATHETERS) ×2 IMPLANT
CLOTH BEACON ORANGE TIMEOUT ST (SAFETY) ×2 IMPLANT
CONTAINER PREFILL 10% NBF 60ML (FORM) ×4 IMPLANT
ELECT REM PT RETURN 9FT ADLT (ELECTROSURGICAL)
ELECTRODE REM PT RTRN 9FT ADLT (ELECTROSURGICAL) IMPLANT
GLOVE BIO SURGEON STRL SZ 6.5 (GLOVE) ×2 IMPLANT
GOWN STRL REUS W/TWL LRG LVL3 (GOWN DISPOSABLE) ×4 IMPLANT
NDL SPNL 20GX3.5 QUINCKE YW (NEEDLE) IMPLANT
NEEDLE SPNL 20GX3.5 QUINCKE YW (NEEDLE) IMPLANT
PACK VAGINAL MINOR WOMEN LF (CUSTOM PROCEDURE TRAY) ×2 IMPLANT
PAD OB MATERNITY 4.3X12.25 (PERSONAL CARE ITEMS) ×2 IMPLANT
SCRUB PCMX 4 OZ (MISCELLANEOUS) ×2 IMPLANT
TOWEL OR 17X24 6PK STRL BLUE (TOWEL DISPOSABLE) ×4 IMPLANT
TUBING AQUILEX INFLOW (TUBING) ×2 IMPLANT
TUBING AQUILEX OUTFLOW (TUBING) ×2 IMPLANT
WATER STERILE IRR 1000ML POUR (IV SOLUTION) ×2 IMPLANT

## 2014-09-13 NOTE — Discharge Instructions (Signed)
DISCHARGE INSTRUCTIONS: hysteroscopy The following instructions have been prepared to help you care for yourself upon your return home.  MAY TAKE IBUPROFEN (MOTRIN, ADVIL) OR ALEVE AFTER 8:15 PM FOR PAIN!!!   Personal hygiene:  Use sanitary pads for vaginal drainage, not tampons.  Shower the day after your procedure.  NO tub baths, pools or Jacuzzis for 2-3 weeks.  Wipe front to back after using the bathroom.  Activity and limitations:  Do NOT drive or operate any equipment for 24 hours. The effects of anesthesia are still present and drowsiness may result.  Do NOT rest in bed all day.  Walking is encouraged.  Walk up and down stairs slowly.  You may resume your normal activity in one to two days or as indicated by your physician.  Sexual activity: NO intercourse for at least 2 weeks after the procedure, or as indicated by your physician.  Diet: Eat a light meal as desired this evening. You may resume your usual diet tomorrow.  Return to work: You may resume your work activities in one to two days or as indicated by your doctor.  What to expect after your surgery: Expect to have vaginal bleeding/discharge for 2-3 days and spotting for up to 10 days. It is not unusual to have soreness for up to 1-2 weeks. You may have a slight burning sensation when you urinate for the first day. Mild cramps may continue for a couple of days. You may have a regular period in 2-6 weeks.  Call your doctor for any of the following:  Excessive vaginal bleeding, saturating and changing one pad every hour.  Inability to urinate 6 hours after discharge from hospital.  Pain not relieved by pain medication.  Fever of 100.4 F or greater.  Unusual vaginal discharge or odor.   Call for an appointment:    Patients signature: ______________________  Nurses signature ________________________  Support person's signature_______________________

## 2014-09-13 NOTE — Op Note (Signed)
Preoperative diagnosis: Retained IUD  Postoperative diagnosis: Retained IUD  Procedure: Attempted operative hysteroscopy for IUD removal, IUD removal  Surgeon: Antionette CharJACKSON-MOORE,Kriston Pasquarello A  Anesthesia: Laryngeal mask airway, paracervical block  Estimated blood loss: Minimal  Urine output: 100 ml  IV Fluids: per Anesthesiology  Complications: None  Specimen: N/A  Operative Findings: The IUD was visualized in the endometrial cavity.  The fundal region was not well visualized.  Description of procedure:   The patient was taken to the operating room and placed on the operating table in the semi-lithotomy position in [place] stirrups.  Examination under anesthesia was performed.  The patient was prepped and draped in the usual manner.  After a time-out had been completed, a speculum was placed in the vagina.  The anterior lip of the cervix was grasped with a single-toothed tenaculum.    10 cc of 1% lidocaine were injected at 4 and 7 o'clock to produce a paracervical block.    The endocervical canal was dilated with Shawnie PonsPratt dilators.  An operative hysteroscope with Glycine as the distending medium was used to perform a diagnostic hysteroscopy.  The findings are noted above.   Attempts were made to grasp one of the upper arms of the T shaped IUD.  These attempts were unsuccessful. The hysteroscope was removed.  A uterine dressing forceps  was used to to grasp the IUD.  The IUD was fractured during the extraction but was felt to be completely removed.  All the instruments were removed from the vagina.  Final instrument counts were correct.  The patient was taken to the PACU in stable condition.

## 2014-09-13 NOTE — Progress Notes (Signed)
Patient ID: Felicia BangsCheryl M Lumbert, female   DOB: Jan 17, 1961, 53 y.o.   MRN: [id number]  Chief Complaint  Patient presents with  . Procedure    Mirena Removal with Cervical Block.     HPI Felicia Wilcox is a 53 y.o. female.  See above.  HPI  Past Medical History  Diagnosis Date  . Smoker   . Anxiety   . Sleep apnea     mild - does not use CPAP machine  . GERD (gastroesophageal reflux disease)   . Arthritis     2 herniated disc lower back  . Chronic lower back pain     Past Surgical History  Procedure Laterality Date  . Abalation     . Neck surgery      ? C3-4-5 with rods  . Tonsillectomy    . Wisdom tooth extraction    . Cesarean section    . Back surgery      x 3 - branch block lower back injections -block nerve endings    Family History  Problem Relation Age of Onset  . Heart disease Mother   . Cancer Father   . Diabetes Maternal Grandmother     Social History History  Substance Use Topics  . Smoking status: Current Some Day Smoker -- 0.50 packs/day for 6 years    Types: Cigarettes  . Smokeless tobacco: Never Used  . Alcohol Use: 0.0 oz/week    0 Not specified per week     Comment: socially    Allergies  Allergen Reactions  . Aspartame And Phenylalanine Anaphylaxis  . Codeine     "Bleed internally"   . Sulfa Antibiotics Nausea And Vomiting    Current Outpatient Prescriptions  Medication Sig Dispense Refill  . Ascorbic Acid (VITAMIN C PO) Take 1 tablet by mouth daily.    Marland Kitchen. BLACK COHOSH PO Take 1 tablet by mouth daily.    Marland Kitchen. buPROPion (WELLBUTRIN XL) 150 MG 24 hr tablet Take 150 mg by mouth 2 (two) times daily.    . misoprostol (CYTOTEC) 200 MCG tablet Place 1 tablet (200 mcg total) vaginally once. Insert 4 hours prior to surgery time. 1 tablet 0  . Multiple Vitamin (MULTIVITAMIN WITH MINERALS) TABS tablet Take 1 tablet by mouth daily.    . pantoprazole (PROTONIX) 40 MG tablet Take 40 mg by mouth daily.    Marland Kitchen. PYRIDOXINE HCL PO Take 1 tablet by mouth daily.     . Zinc Acetate, Oral, (ZINC ACETATE PO) Take 1 tablet by mouth daily.     No current facility-administered medications for this visit.    Review of Systems Review of Systems Non-contributory    Blood pressure 152/95, pulse 90, temperature 99.3 F (37.4 C), height 5\' 3"  (1.6 m), weight 61.236 kg (135 lb).  Physical Exam Physical Exam Pelvis: Cervix: IUD strings present at the external os Adnexa: normal bimanual exam Uterus: retroverted and non-tender, normal size   [place]rt[place] consent was obtained.   A timeout was performed.  The cervix was prepped with betadine. A single tooth tenaculum was applied to the anterior lip of the cervix.  4 ml of 1% lidocaine were infiltrated at 4 and 8 o'clock.  The cervix was dilated with Shawnie PonsPratt dilators under U/S guidance.  Attempts were made to remove the IUD with the IUD hook and uterine dressing forceps.  These attempts were unsuccessful.   Data Reviewed None  Assessment    Retained IUD--?scarring/iatrogenic Asherman's     Plan  Possible management options include: attempt to remove IUD with hysteroscopy Follow up as needed.         JACKSON-MOORE,Kaushal Vannice A 09/13/2014, 9:21 PM

## 2014-09-13 NOTE — H&P (Signed)
  Chief Complaint: 53 y.o. who presents with hysteroscopy with IUD removal  Details of Present Illness: The patient is status post several attempts to remove the IUD in the office.  There is a history of an endometrial ablation.  Ht 5\' 3"  (1.6 m)  Wt 61.236 kg (135 lb)  BMI 23.92 kg/m2  Past Medical History  Diagnosis Date  . Smoker   . Anxiety   . Sleep apnea     mild - does not use CPAP machine  . GERD (gastroesophageal reflux disease)   . Arthritis     2 herniated disc lower back  . Chronic lower back pain    History   Social History  . Marital Status: Single    Spouse Name: N/A    Number of Children: N/A  . Years of Education: N/A   Occupational History  . Not on file.   Social History Main Topics  . Smoking status: Current Some Day Smoker -- 0.50 packs/day for 6 years    Types: Cigarettes  . Smokeless tobacco: Never Used  . Alcohol Use: 0.0 oz/week    0 Not specified per week     Comment: socially  . Drug Use: No  . Sexual Activity: No   Other Topics Concern  . Not on file   Social History Narrative   Family History  Problem Relation Age of Onset  . Heart disease Mother   . Cancer Father   . Diabetes Maternal Grandmother     Pertinent items are noted in HPI.  Pre-Op Diagnosis: Retained IUD   Planned Procedure: Procedure(s): HYSTEROSCOPY  I have reviewed the patient's history and have completed the physical exam and Verl BangsCheryl M Platts is acceptable for surgery.  Roseanna RainbowJACKSON-MOORE,Kasey Ewings A, MD 09/13/2014 8:32 AM

## 2014-09-13 NOTE — Anesthesia Procedure Notes (Signed)
Procedure Name: LMA Insertion Date/Time: 09/13/2014 1:38 PM Performed by: Graciela HusbandsFUSSELL, Lokelani Lutes O Pre-anesthesia Checklist: Emergency Drugs available, Timeout performed, Patient being monitored, Patient identified and Suction available Patient Re-evaluated:Patient Re-evaluated prior to inductionOxygen Delivery Method: Circle system utilized Preoxygenation: Pre-oxygenation with 100% oxygen Intubation Type: IV induction LMA: LMA inserted LMA Size: 4.0 Number of attempts: 1 Placement Confirmation: breath sounds checked- equal and bilateral and positive ETCO2 Tube secured with: Tape Dental Injury: Teeth and Oropharynx as per pre-operative assessment

## 2014-09-13 NOTE — Anesthesia Postprocedure Evaluation (Signed)
  Anesthesia Post-op Note  Patient: Felicia Wilcox  Procedure(s) Performed: Procedure(s) with comments: HYSTEROSCOPY / REMOVAL OF INTRAUTERINE DEVICE (N/A) - IUD removal Patient is awake and responsive. Pain and nausea are reasonably well controlled. Vital signs are stable and clinically acceptable. Oxygen saturation is clinically acceptable. There are no apparent anesthetic complications at this time. Patient is ready for discharge.

## 2014-09-13 NOTE — Transfer of Care (Signed)
Immediate Anesthesia Transfer of Care Note  Patient: Verl BangsCheryl M Piccini  Procedure(s) Performed: Procedure(s) with comments: HYSTEROSCOPY / REMOVAL OF INTRAUTERINE DEVICE (N/A) - IUD removal  Patient Location: PACU  Anesthesia Type:General  Level of Consciousness: awake, alert  and oriented  Airway & Oxygen Therapy: Patient Spontanous Breathing and Patient connected to nasal cannula oxygen  Post-op Assessment: Report given to PACU RN and Post -op Vital signs reviewed and stable  Post vital signs: Reviewed and stable  Complications: No apparent anesthesia complications

## 2014-09-13 NOTE — Patient Instructions (Signed)
Hysteroscopy Hysteroscopy is a procedure used for looking inside the womb (uterus). It may be done for various reasons, including:  To evaluate abnormal bleeding, fibroid (benign, noncancerous) tumors, polyps, scar tissue (adhesions), and possibly cancer of the uterus.  To look for lumps (tumors) and other uterine growths.  To look for causes of why a woman cannot get pregnant (infertility), causes of recurrent loss of pregnancy (miscarriages), or a lost intrauterine device (IUD).  To perform a sterilization by blocking the fallopian tubes from inside the uterus. In this procedure, a thin, flexible tube with a tiny light and camera on the end of it (hysteroscope) is used to look inside the uterus. A hysteroscopy should be done right after a menstrual period to be sure you are not pregnant. LET YOUR HEALTH CARE PROVIDER KNOW ABOUT:   Any allergies you have.  All medicines you are taking, including vitamins, herbs, eye drops, creams, and over-the-counter medicines.  Previous problems you or members of your family have had with the use of anesthetics.  Any blood disorders you have.  Previous surgeries you have had.  Medical conditions you have. RISKS AND COMPLICATIONS  Generally, this is a safe procedure. However, as with any procedure, complications can occur. Possible complications include:  Putting a hole in the uterus.  Excessive bleeding.  Infection.  Damage to the cervix.  Injury to other organs.  Allergic reaction to medicines.  Too much fluid used in the uterus for the procedure. BEFORE THE PROCEDURE   Ask your health care provider about changing or stopping any regular medicines.  Do not take aspirin or blood thinners for 1 week before the procedure, or as directed by your health care provider. These can cause bleeding.  If you smoke, do not smoke for 2 weeks before the procedure.  In some cases, a medicine is placed in the cervix the day before the procedure.  This medicine makes the cervix have a larger opening (dilate). This makes it easier for the instrument to be inserted into the uterus during the procedure.  Do not eat or drink anything for at least 8 hours before the surgery.  Arrange for someone to take you home after the procedure. PROCEDURE   You may be given a medicine to relax you (sedative). You may also be given one of the following:  A medicine that numbs the area around the cervix (local anesthetic).  A medicine that makes you sleep through the procedure (general anesthetic).  The hysteroscope is inserted through the vagina into the uterus. The camera on the hysteroscope sends a picture to a TV screen. This gives the surgeon a good view inside the uterus.  During the procedure, air or a liquid is put into the uterus, which allows the surgeon to see better.  Sometimes, tissue is gently scraped from inside the uterus. These tissue samples are sent to a lab for testing. AFTER THE PROCEDURE   If you had a general anesthetic, you may be groggy for a couple hours after the procedure.  If you had a local anesthetic, you will be able to go home as soon as you are stable and feel ready.  You may have some cramping. This normally lasts for a couple days.  You may have bleeding, which varies from light spotting for a few days to menstrual-like bleeding for 3-7 days. This is normal.  If your test results are not back during the visit, make an appointment with your health care provider to find out the   results. Document Released: 12/20/2000 Document Revised: 07/04/2013 Document Reviewed: 04/12/2013 Exit[employer] Patient Information 2015 [employer]. This information is not intended to replace advice given to you by your health care provider. Make sure you discuss any questions you have with your health care provider.   

## 2014-09-13 NOTE — Anesthesia Preprocedure Evaluation (Signed)
Anesthesia Evaluation  Patient identified by MRN, date of birth, ID band Patient awake    Reviewed: Allergy & Precautions, H&P , Patient's Chart, lab work & pertinent test results  Airway Mallampati: II  TM Distance: >3 FB Neck ROM: full    Dental no notable dental hx.    Pulmonary sleep apnea , Current Smoker,  breath sounds clear to auscultation  Pulmonary exam normal       Cardiovascular Exercise Tolerance: Good Rhythm:regular Rate:Normal     Neuro/Psych    GI/Hepatic GERD-  Medicated,  Endo/Other    Renal/GU      Musculoskeletal   Abdominal   Peds  Hematology   Anesthesia Other Findings Recent post-nasal drip; feels well;  no URI  Reproductive/Obstetrics                             Anesthesia Physical Anesthesia Plan  ASA: II  Anesthesia Plan:    Post-op Pain Management:    Induction: Intravenous  Airway Management Planned: LMA  Additional Equipment:   Intra-op Plan:   Post-operative Plan:   Informed Consent: I have reviewed the patients History and Physical, chart, labs and discussed the procedure including the risks, benefits and alternatives for the proposed anesthesia with the patient or authorized representative who has indicated his/her understanding and acceptance.   Dental Advisory Given and Dental advisory given  Plan Discussed with: CRNA and Surgeon  Anesthesia Plan Comments: (Discussed GA with LMA, possible sore throat, potential need to switch to ETT, N/V, pulmonary aspiration. Questions answered. )        Anesthesia Quick Evaluation

## 2014-09-16 ENCOUNTER — Ambulatory Visit: Payer: Self-pay | Admitting: Obstetrics

## 2014-09-17 ENCOUNTER — Encounter (HOSPITAL_COMMUNITY): Payer: Self-pay | Admitting: Obstetrics & Gynecology

## 2014-09-23 ENCOUNTER — Encounter: Payer: Self-pay | Admitting: *Deleted

## 2014-09-24 ENCOUNTER — Encounter: Payer: Self-pay | Admitting: Obstetrics & Gynecology

## 2015-02-26 ENCOUNTER — Telehealth: Payer: Self-pay | Admitting: *Deleted

## 2015-02-26 NOTE — Telephone Encounter (Signed)
Patient states she has BV and would like for Dr Clearance CootsHarper to send something in for her- he normally treats her for this annually.

## 2015-02-27 ENCOUNTER — Other Ambulatory Visit: Payer: Self-pay | Admitting: Obstetrics

## 2015-02-27 DIAGNOSIS — N760 Acute vaginitis: Principal | ICD-10-CM

## 2015-02-27 DIAGNOSIS — B3731 Acute candidiasis of vulva and vagina: Secondary | ICD-10-CM

## 2015-02-27 DIAGNOSIS — B373 Candidiasis of vulva and vagina: Secondary | ICD-10-CM

## 2015-02-27 DIAGNOSIS — B9689 Other specified bacterial agents as the cause of diseases classified elsewhere: Secondary | ICD-10-CM

## 2015-02-27 MED ORDER — METRONIDAZOLE 0.75 % VA GEL: 1.0000 | Freq: Two times a day (BID) | VAGINAL | Status: DC

## 2015-02-27 MED ORDER — BUTOCONAZOLE NITRATE (1 DOSE) 2 % VA CREA: 1.0000 | TOPICAL_CREAM | Freq: Every evening | VAGINAL | Status: DC | PRN

## 2015-03-03 ENCOUNTER — Other Ambulatory Visit: Payer: Self-pay | Admitting: Obstetrics

## 2015-03-03 DIAGNOSIS — B373 Candidiasis of vulva and vagina: Secondary | ICD-10-CM

## 2015-03-03 DIAGNOSIS — B3731 Acute candidiasis of vulva and vagina: Secondary | ICD-10-CM

## 2015-03-03 MED ORDER — TERCONAZOLE 0.4 % VA CREA: 1.0000 | TOPICAL_CREAM | Freq: Every day | VAGINAL | Status: DC

## 2015-03-12 ENCOUNTER — Telehealth: Payer: Self-pay | Admitting: *Deleted

## 2015-03-12 NOTE — Telephone Encounter (Signed)
Felicia Wilcox called to discuss her visit from 08-08-2014 with Dr. Tamela Oddi. She is upset that we sent lab work to [facility] and to [facility] without telling her what we were doing. I explained to her what tests were done and we then went on to discuss her bill. I've called both [facility] and [facility]. Felicia Wilcox to call me if she has issues with [facility]. [facility] is paid in full at this point. [facility] is going to treat Felicia Wilcox as being in network and she should only be responsible for what she would have been responsible for if the lab comp participated with her [facility].

## 2015-06-14 ENCOUNTER — Emergency Department (HOSPITAL_BASED_OUTPATIENT_CLINIC_OR_DEPARTMENT_OTHER): Payer: BLUE CROSS/BLUE SHIELD

## 2015-06-14 ENCOUNTER — Emergency Department (HOSPITAL_BASED_OUTPATIENT_CLINIC_OR_DEPARTMENT_OTHER)
Admission: EM | Admit: 2015-06-14 | Discharge: 2015-06-14 | Disposition: A | Discharge status: Home / Self Care | Payer: BLUE CROSS/BLUE SHIELD | Attending: Emergency Medicine | Admitting: Emergency Medicine

## 2015-06-14 ENCOUNTER — Encounter (HOSPITAL_BASED_OUTPATIENT_CLINIC_OR_DEPARTMENT_OTHER): Payer: Self-pay | Admitting: Emergency Medicine

## 2015-06-14 DIAGNOSIS — T148XXA Other injury of unspecified body region, initial encounter: Secondary | ICD-10-CM

## 2015-06-14 DIAGNOSIS — Y998 Other external cause status: Secondary | ICD-10-CM | POA: Insufficient documentation

## 2015-06-14 DIAGNOSIS — Z8739 Personal history of other diseases of the musculoskeletal system and connective tissue: Secondary | ICD-10-CM | POA: Insufficient documentation

## 2015-06-14 DIAGNOSIS — W19XXXA Unspecified fall, initial encounter: Secondary | ICD-10-CM

## 2015-06-14 DIAGNOSIS — S8992XA Unspecified injury of left lower leg, initial encounter: Secondary | ICD-10-CM | POA: Diagnosis present

## 2015-06-14 DIAGNOSIS — F419 Anxiety disorder, unspecified: Secondary | ICD-10-CM | POA: Diagnosis not present

## 2015-06-14 DIAGNOSIS — G8929 Other chronic pain: Secondary | ICD-10-CM | POA: Diagnosis not present

## 2015-06-14 DIAGNOSIS — S199XXA Unspecified injury of neck, initial encounter: Secondary | ICD-10-CM | POA: Diagnosis not present

## 2015-06-14 DIAGNOSIS — Z79899 Other long term (current) drug therapy: Secondary | ICD-10-CM | POA: Diagnosis not present

## 2015-06-14 DIAGNOSIS — S8991XA Unspecified injury of right lower leg, initial encounter: Secondary | ICD-10-CM | POA: Diagnosis not present

## 2015-06-14 DIAGNOSIS — M7918 Myalgia, other site: Secondary | ICD-10-CM

## 2015-06-14 DIAGNOSIS — K219 Gastro-esophageal reflux disease without esophagitis: Secondary | ICD-10-CM | POA: Insufficient documentation

## 2015-06-14 DIAGNOSIS — S80212A Abrasion, left knee, initial encounter: Secondary | ICD-10-CM | POA: Insufficient documentation

## 2015-06-14 DIAGNOSIS — W109XXA Fall (on) (from) unspecified stairs and steps, initial encounter: Secondary | ICD-10-CM | POA: Insufficient documentation

## 2015-06-14 DIAGNOSIS — S0990XA Unspecified injury of head, initial encounter: Secondary | ICD-10-CM | POA: Insufficient documentation

## 2015-06-14 DIAGNOSIS — S3992XA Unspecified injury of lower back, initial encounter: Secondary | ICD-10-CM | POA: Insufficient documentation

## 2015-06-14 DIAGNOSIS — Z720 Tobacco use: Secondary | ICD-10-CM | POA: Insufficient documentation

## 2015-06-14 DIAGNOSIS — Y9389 Activity, other specified: Secondary | ICD-10-CM | POA: Diagnosis not present

## 2015-06-14 DIAGNOSIS — Y92009 Unspecified place in unspecified non-institutional (private) residence as the place of occurrence of the external cause: Secondary | ICD-10-CM | POA: Insufficient documentation

## 2015-06-14 MED ORDER — OXYCODONE-ACETAMINOPHEN 5-325 MG PO TABS: ORAL_TABLET | ORAL | Status: DC

## 2015-06-14 MED ORDER — ACETAMINOPHEN 325 MG PO TABS
975.0000 mg | ORAL_TABLET | Freq: Once | ORAL | Status: DC
Start: 2015-06-14 — End: 2015-06-14

## 2015-06-14 MED ORDER — OXYCODONE-ACETAMINOPHEN 5-325 MG PO TABS
1.0000 | ORAL_TABLET | Freq: Once | ORAL | Status: AC
  Administered 2015-06-14: 1 via ORAL
  Filled 2015-06-14: qty 1

## 2015-06-14 NOTE — ED Provider Notes (Signed)
CSN: [id number]     Arrival date & time 06/14/15  1849 History   First MD Initiated Contact with Patient 06/14/15 1947     Chief Complaint  Patient presents with  . Neck Pain  . Back Pain     (Consider location/radiation/quality/duration/timing/severity/associated sxs/prior Treatment) HPI  Blood pressure 147/82, pulse 83, temperature 99.3 F (37.4 C), temperature source Oral, resp. rate 18, height  (1.6 m), weight 130 lb (58.968 kg), SpO2 100 %.  Felicia Wilcox is a 54 y.o. female complaining of pain status post slip and fall down 15 steps last night. Patient states she was going up the steps and slipped on the top. She fell backward and landed at the base of the staircase. There was head trauma with no loss of consciousness. Patient has pain in the neck, low back, and right knee. Patient is status post cervical fusion in the remote past. She denies anticoagulation, change in vision, numbness, weakness, chest pain, shortness of breath, difficulty ambulating or moving major joints. She rates her pain at 8 out of 10 and is exacerbated by movement and palpation. Patient states her tetanus shot was within the last 10 years.  Past Medical History  Diagnosis Date  . Smoker   . Anxiety   . Sleep apnea     mild - does not use CPAP machine  . GERD (gastroesophageal reflux disease)   . Arthritis     2 herniated disc lower back  . Chronic lower back pain    Past Surgical History  Procedure Laterality Date  . Abalation     . Neck surgery      ? C3-4-5 with rods  . Tonsillectomy    . Wisdom tooth extraction    . Cesarean section    . Back surgery      x 3 - branch block lower back injections -block nerve endings  . Hysteroscopy N/A 09/13/2014    Procedure: HYSTEROSCOPY / REMOVAL OF INTRAUTERINE DEVICE;  Surgeon: Antionette Char, MD;  Location: [facility];  Service: Gynecology;  Laterality: N/A;  IUD removal   Family History  Problem Relation Age of Onset  . Heart disease Mother    . Cancer Father   . Diabetes Maternal Grandmother    Social History  Substance Use Topics  . Smoking status: Current Some Day Smoker -- 0.50 packs/day for 6 years    Types: Cigarettes  . Smokeless tobacco: Never Used  . Alcohol Use: 0.0 oz/week    0 Standard drinks or equivalent per week     Comment: socially   OB History    No data available     Review of Systems  10 systems reviewed and found to be negative, except as noted in the HPI.   Allergies  Aspartame and phenylalanine; Codeine; and Sulfa antibiotics  Home Medications   Prior to Admission medications   Medication Sig Start Date End Date Taking? Authorizing Provider  Ascorbic Acid (VITAMIN C PO) Take 1 tablet by mouth daily.    Historical Provider, MD  BLACK COHOSH PO Take 1 tablet by mouth daily.    Historical Provider, MD  buPROPion (WELLBUTRIN XL) 150 MG 24 hr tablet Take 150 mg by mouth 2 (two) times daily.    Historical Provider, MD  Butoconazole Nitrate, 1 Dose, 2 % CREA Place 1 Applicatorful vaginally at bedtime as needed. 02/27/15   Brock Bad, MD  metroNIDAZOLE (METROGEL VAGINAL) 0.75 % vaginal gel Place 1 Applicatorful vaginally 2 (two) times  daily. Apply bid  for 5 days. 02/27/15   Brock Bad, MD  misoprostol (CYTOTEC) 200 MCG tablet Place 1 tablet (200 mcg total) vaginally once. Insert 4 hours prior to surgery time. 09/11/14   Antionette Char, MD  Multiple Vitamin (MULTIVITAMIN WITH MINERALS) TABS tablet Take 1 tablet by mouth daily.    Historical Provider, MD  pantoprazole (PROTONIX) 40 MG tablet Take 40 mg by mouth daily.    Historical Provider, MD  PYRIDOXINE HCL PO Take 1 tablet by mouth daily.    Historical Provider, MD  terconazole (TERAZOL 7) 0.4 % vaginal cream Place 1 applicator vaginally at bedtime. 03/03/15   Brock Bad, MD  Zinc Acetate, Oral, (ZINC ACETATE PO) Take 1 tablet by mouth daily.    Historical Provider, MD   BP 176/95 mmHg  Pulse 90  Temp(Src) 99.3 F (37.4 C)  (Oral)  Resp 18  Ht 5\' 3"  (1.6 m)  Wt 130 lb (58.968 kg)  BMI 23.03 kg/m2  SpO2 99% Physical Exam  Constitutional: She is oriented to person, place, and time. She appears well-developed and well-nourished.  HENT:  Head: Normocephalic and atraumatic.  Mouth/Throat: Oropharynx is clear and moist.  No abrasions or contusions.   No hemotympanum, battle signs or raccoon's eyes  No crepitance or tenderness to palpation along the orbital rim.  EOMI intact with no pain or diplopia  No abnormal otorrhea or rhinorrhea. Nasal septum midline.  No intraoral trauma.  Eyes: Conjunctivae and EOM are normal. Pupils are equal, round, and reactive to light.  Neck: Normal range of motion. Neck supple.  Patient in soft c-collar.  No midline C-spine  tenderness to palpation or step-offs appreciated .  Grip/Biceps/Tricep strength 5/5 bilaterally, sensation to UE intact bilaterally.    Cardiovascular: Normal rate, regular rhythm and intact distal pulses.   Pulmonary/Chest: Effort normal and breath sounds normal. No respiratory distress. She has no wheezes. She has no rales. She exhibits no tenderness.  No seatbelt sign, TTP or crepitance  Abdominal: Soft. Bowel sounds are normal. She exhibits no distension and no mass. There is no tenderness. There is no rebound and no guarding.  No Seatbelt Sign  Musculoskeletal: Normal range of motion. She exhibits no edema or tenderness.  No point tenderness to percussion of lumbar spinal processes.  ++ TTP or paraspinal muscular spasm to lumbar spine bilaterally.. Strength is 5 out of 5 to bilateral lower extremities at hip and knee; extensor hallucis longus 5 out of 5. Ankle strength 5 out of 5, no clonus, neurovascularly intact. No saddle anaesthesia. Patellar reflexes are 2+ bilaterally.     Pelvis stable. No deformity or TTP of major joints.   Left knee with partial thickness abrasion. FROM. No effusion or crepitance. Anterior and posterior drawer show no  abnormal laxity. Stable to valgus and varus stress. Joint lines are non-tender. Neurovascularly intact. Pt ambulates with non-antalgic gait.    Neurological: She is alert and oriented to person, place, and time.  Follows commands, Clear, goal oriented speech, Strength is 5 out of 5x4 extremities, patient ambulates with a coordinated in nonantalgic gait. Sensation is grossly intact.   Skin: Skin is warm.  Psychiatric: She has a normal mood and affect.  Nursing note and vitals reviewed.   ED Course  Procedures (including critical care time) Labs Review Labs Reviewed - No data to display  Imaging Review Dg Chest 2 View  06/14/2015   CLINICAL DATA:  Pt was ascending flight of stairs last night and slipped  at top of staircase, tumbling length of staircase down, landing at base. Left knee pain, Low Back pain.  EXAM: CHEST  2 VIEW  COMPARISON:  None.  FINDINGS: Cardiac silhouette is normal in size and configuration. Normal mediastinal and hilar contours.  Clear lungs.  No pleural effusion or pneumothorax.  Bony thorax is intact.  IMPRESSION: No active cardiopulmonary disease.   Electronically Signed   By: Amie Portland M.D.   On: 06/14/2015 21:35   Dg Lumbar Spine Complete  06/14/2015   CLINICAL DATA:  Slip and fall down stairs last night, now with low back pain.  EXAM: LUMBAR SPINE - COMPLETE 4+ VIEW  COMPARISON:  None.  FINDINGS: No fracture. The alignment is maintained. Vertebral body heights are normal. There is no listhesis. The posterior elements are intact. There is disc space narrowing at L5-S1 with associated endplate spurring. Mild endplate spurring at L2-L3. Facet hypertrophy at L4-L5 and L5-S1. Sacroiliac joints are symmetric and normal. Vascular calcifications of the aorta are seen.  IMPRESSION: No fracture or subluxation of the lumbar spine. Mild degenerative change.   Electronically Signed   By: Rubye Oaks M.D.   On: 06/14/2015 21:37   Ct Head Wo Contrast  06/14/2015   CLINICAL  DATA:  54 year old female with fall  EXAM: CT HEAD WITHOUT CONTRAST  CT CERVICAL SPINE WITHOUT CONTRAST  TECHNIQUE: Multidetector CT imaging of the head and cervical spine was performed following the standard protocol without intravenous contrast. Multiplanar CT image reconstructions of the cervical spine were also generated.  COMPARISON:  Head CT dated 05/05/2012  FINDINGS: CT HEAD FINDINGS  The ventricles and the sulci are appropriate in size for the patient's age. There is no intracranial hemorrhage. No midline shift or mass effect identified. The gray-white matter differentiation is preserved. The visualized paranasal sinuses and mastoid air cells are well aerated. The calvarium is intact.  CT CERVICAL SPINE FINDINGS  There is no acute fracture or subluxation of the cervical spine.C3-C6 disc spacers and anterior fusion plate and screws noted.The odontoid and spinous processes are intact.There is normal anatomic alignment of the C1-C2 lateral masses. The visualized soft tissues appear unremarkable.  IMPRESSION: No acute intracranial pathology.  C3-C6 anterior fusion plate and screws. No acute/ traumatic cervical spine pathology.   Electronically Signed   By: Elgie Collard M.D.   On: 06/14/2015 21:35   Ct Cervical Spine Wo Contrast  06/14/2015   CLINICAL DATA:  54 year old female with fall  EXAM: CT HEAD WITHOUT CONTRAST  CT CERVICAL SPINE WITHOUT CONTRAST  TECHNIQUE: Multidetector CT imaging of the head and cervical spine was performed following the standard protocol without intravenous contrast. Multiplanar CT image reconstructions of the cervical spine were also generated.  COMPARISON:  Head CT dated 05/05/2012  FINDINGS: CT HEAD FINDINGS  The ventricles and the sulci are appropriate in size for the patient's age. There is no intracranial hemorrhage. No midline shift or mass effect identified. The gray-white matter differentiation is preserved. The visualized paranasal sinuses and mastoid air cells are  well aerated. The calvarium is intact.  CT CERVICAL SPINE FINDINGS  There is no acute fracture or subluxation of the cervical spine.C3-C6 disc spacers and anterior fusion plate and screws noted.The odontoid and spinous processes are intact.There is normal anatomic alignment of the C1-C2 lateral masses. The visualized soft tissues appear unremarkable.  IMPRESSION: No acute intracranial pathology.  C3-C6 anterior fusion plate and screws. No acute/ traumatic cervical spine pathology.   Electronically Signed   By: Burtis Junes  Radparvar M.D.   On: 06/14/2015 21:35   Dg Knee Complete 4 Views Left  06/14/2015   CLINICAL DATA:  Pt was ascending flight of stairs last night and slipped at top of staircase, tumbling length of staircase down, landing at base. Left knee pain, Low Back pain.  EXAM: LEFT KNEE - COMPLETE 4+ VIEW  COMPARISON:  None.  FINDINGS: There is no evidence of fracture, dislocation, or joint effusion. There is no evidence of arthropathy or other focal bone abnormality. Soft tissues are unremarkable.  IMPRESSION: Negative.   Electronically Signed   By: Amie Portland M.D.   On: 06/14/2015 21:34   I have personally reviewed and evaluated these images and lab results as part of my medical decision-making.   EKG Interpretation None      MDM   Final diagnoses:  Musculoskeletal pain  Fall at home, initial encounter  Abrasion    Filed Vitals:   06/14/15 1901 06/14/15 2142  BP: 176/95 147/82  Pulse: 90 83  Temp: 99.3 F (37.4 C)   TempSrc: Oral   Resp: 18 18  Height: 5\' 3"  (1.6 m)   Weight: 130 lb (58.968 kg)   SpO2: 99% 100%    Medications  oxyCODONE-acetaminophen (PERCOCET/ROXICET) 5-325 MG per tablet 1 tablet (1 tablet Oral Given 06/14/15 2139)    Felicia Wilcox is a pleasant 54 y.o. female presenting with pain to neck, low back and left knee status post fall down 15 steps last evening. Patient had head trauma but her neuro exam is nonfocal. There was no loss of consciousness, she's  not anticoagulated. Patient has history of C-spine surgeries. States she has severe pain in this area however her strength is intact.  CT head and C-spine are negative, plain film of chest x-ray and knee are also negative. Patient's tetanus shot is up-to-date. Patient will be given prescription for Percocet and asked to follow closely with both her primary care physician and her neurosurgeon.  Evaluation does not show pathology that would require ongoing emergent intervention or inpatient treatment. Pt is hemodynamically stable and mentating appropriately. Discussed findings and plan with patient/guardian, who agrees with care plan. All questions answered. Return precautions discussed and outpatient follow up given.   New Prescriptions   OXYCODONE-ACETAMINOPHEN (PERCOCET/ROXICET) 5-325 MG PER TABLET    1 to 2 tabs PO q6hrs  PRN for pain         Wynetta Emery, PA-C 06/14/15 1610  Elwin Mocha, MD 06/14/15 2322

## 2015-06-14 NOTE — ED Notes (Signed)
Patient transported to X-ray 

## 2015-06-14 NOTE — ED Notes (Signed)
PA at bedside.

## 2015-06-14 NOTE — Discharge Instructions (Signed)
For pain control please take ibuprofen (also known as Motrin or Advil)  (this is normally 4 over the counter pills) 3 times a day  for 5 days. Take with food to minimize stomach irritation.  Take percocet for breakthrough pain, do not drink alcohol, drive, care for children or do other critical tasks while taking percocet.  Please follow with your primary care doctor in the next 2 days for a check-up. They must obtain records for further management.   Do not hesitate to return to the Emergency Department for any new, worsening or concerning symptoms.    Musculoskeletal Pain Musculoskeletal pain is muscle and boney aches and pains. These pains can occur in any part of the body. Your caregiver may treat you without knowing the cause of the pain. They may treat you if blood or urine tests, X-rays, and other tests were normal.  CAUSES There is often not a definite cause or reason for these pains. These pains may be caused by a type of germ (virus). The discomfort may also come from overuse. Overuse includes working out too hard when your body is not fit. Boney aches also come from weather changes. Bone is sensitive to atmospheric pressure changes. HOME CARE INSTRUCTIONS   Ask when your test results will be ready. Make sure you get your test results.  Only take over-the-counter or prescription medicines for pain, discomfort, or fever as directed by your caregiver. If you were given medications for your condition, do not drive, operate machinery or power tools, or sign legal documents for 24 hours. Do not drink alcohol. Do not take sleeping pills or other medications that may interfere with treatment.  Continue all activities unless the activities cause more pain. When the pain lessens, slowly resume normal activities. Gradually increase the intensity and duration of the activities or exercise.  During periods of severe pain, bed rest may be helpful. Lay or sit in any position that is  comfortable.  Putting ice on the injured area.  Put ice in a bag.  Place a towel between your skin and the bag.  Leave the ice on for 15 to 20 minutes, 3 to 4 times a day.  Follow up with your caregiver for continued problems and no reason can be found for the pain. If the pain becomes worse or does not go away, it may be necessary to repeat tests or do additional testing. Your caregiver may need to look further for a possible cause. SEEK IMMEDIATE MEDICAL CARE IF:  You have pain that is getting worse and is not relieved by medications.  You develop chest pain that is associated with shortness or breath, sweating, feeling sick to your stomach (nauseous), or throw up (vomit).  Your pain becomes localized to the abdomen.  You develop any new symptoms that seem different or that concern you. MAKE SURE YOU:   Understand these instructions.  Will watch your condition.  Will get help right away if you are not doing well or get worse. Document Released: 09/13/2005 Document Revised: 12/06/2011 Document Reviewed: 05/18/2013 [facility] Patient Information 2015 [place], [place]. This information is not intended to replace advice given to you by your health care provider. Make sure you discuss any questions you have with your health care provider.

## 2015-06-14 NOTE — ED Notes (Signed)
Pt was ascending flight of stairs last night and slipped at top of staircase, tumbling length of staircase down, landing at base.

## 2015-08-27 ENCOUNTER — Institutional Professional Consult (permissible substitution): Payer: BLUE CROSS/BLUE SHIELD | Admitting: Pulmonary Disease

## 2015-09-03 ENCOUNTER — Ambulatory Visit (INDEPENDENT_AMBULATORY_CARE_PROVIDER_SITE_OTHER): Payer: BLUE CROSS/BLUE SHIELD | Admitting: Internal Medicine

## 2015-09-03 ENCOUNTER — Encounter: Payer: Self-pay | Admitting: Internal Medicine

## 2015-09-03 VITALS — BP 146/90 | HR 96 | Ht 63.0 in | Wt 129.6 lb

## 2015-09-03 DIAGNOSIS — J4530 Mild persistent asthma, uncomplicated: Secondary | ICD-10-CM | POA: Diagnosis not present

## 2015-09-03 DIAGNOSIS — F17210 Nicotine dependence, cigarettes, uncomplicated: Secondary | ICD-10-CM

## 2015-09-03 DIAGNOSIS — Z720 Tobacco use: Secondary | ICD-10-CM | POA: Diagnosis not present

## 2015-09-03 DIAGNOSIS — R059 Cough, unspecified: Secondary | ICD-10-CM

## 2015-09-03 DIAGNOSIS — R05 Cough: Secondary | ICD-10-CM

## 2015-09-03 MED ORDER — MOMETASONE FURO-FORMOTEROL FUM 100-5 MCG/ACT IN AERO: INHALATION_SPRAY | RESPIRATORY_TRACT | Status: DC

## 2015-09-03 MED ORDER — AMOXICILLIN-POT CLAVULANATE 875-125 MG PO TABS
1.0000 | ORAL_TABLET | Freq: Two times a day (BID) | ORAL | Status: DC
Start: 2015-09-03 — End: 2015-10-20

## 2015-09-03 MED ORDER — FLUCONAZOLE 150 MG PO TABS: 150.0000 mg | ORAL_TABLET | Freq: Every day | ORAL | Status: DC

## 2015-09-03 NOTE — Patient Instructions (Addendum)
Augmentin 875 mg take one pill twice daily  X 10 days - take at breakfast and supper with large glass of water.  It would help reduce the usual side effects (diarrhea and yeast infections) if you ate cultured yogurt at lunch. > if not better call [facility]my[facility] FreeLibby [phone]   Best cough medicine > mucinex or mucinex dm 1200 mg every 12 hours  dulera 100 Take 2 puffs first thing in am and then another 2 puffs about 12 hours later.    Only use your albuterol as a rescue medication to be used if you can't catch your breath by resting or doing a relaxed purse lip breathing pattern.  - The less you use it, the better it will work when you need it. - Ok to use up to 2 puffs  every 4 hours if you must but call for immediate appointment if use goes up over your usual need - Don't leave home without it !!  (think of it like the spare tire for your car)   The key is to stop smoking completely before smoking completely stops you! - ok to try e cigs as one way bridge off cigarettes   Please schedule a follow up office visit in 4 weeks, sooner if needed with pfts on return

## 2015-09-03 NOTE — Progress Notes (Signed)
Subjective:    Patient ID: Felicia Wilcox, female    DOB: Jul 22, 1961      MRN: [id number]  HPI   40 yowf active smoker with no resp problems at all until early Nov 2016 acutely ill flu like illness > head/ chest congestion with green mucus no better on zpak, some better on prednisone but persistent cough/ wheeze/sob so referred to pulmonary clinic 09/03/2015 by Dr Renne Crigler   09/03/2015 1st [facility] office visit/ Felicia Wilcox   Chief Complaint  Patient presents with  . PULMONARY CONSULT    Pt referred by Dr. Renne Crigler for asthma: pt states about 6 weeks ago she came down with something her chest and head clogged up. pt was prescribed a z pak that didnt help much. pt also completed a course of prednisone that did help however she is stil coughing up greenish in color. pt states that there is a lot of mold in her house and thinks this may be a part of the issues she is having. pt c/o some wheezing at night and a tiny bit of chest tightness.   75% better from pred, finished one week prior to OV  And losing some ground back to 50% level since ran out with worse cough and sob /wheeze  "nothing else makes it better" ? Has used saba - she's not sure Mucus is mostly green in am and less so during the day, sleeping better now but when acutely ill  did not Main symptom is head congestion / L ear ache   No obvious other patterns in day to day or daytime variabilty or assoc  cp or chest tightness, subjective wheeze or  overt   hb symptoms. No unusual exp hx or h/o childhood pna/ asthma or knowledge of premature birth.  Sleeping ok without nocturnal  or early am exacerbation  of respiratory  c/o's or need for noct saba. Also denies any obvious fluctuation of symptoms with weather or environmental changes or other aggravating or alleviating factors except as outlined above   Current Medications, Allergies, Complete Past Medical History, Past Surgical History, Family History, and Social History were reviewed in  [employer] record.             Review of Systems  Constitutional: Negative for fever and unexpected weight change.  HENT: Positive for postnasal drip and sinus pressure. Negative for congestion, dental problem, ear pain, nosebleeds, sneezing, sore throat and trouble swallowing.   Eyes: Negative for redness and itching.  Respiratory: Positive for cough and chest tightness. Negative for shortness of breath and wheezing.   Cardiovascular: Negative for palpitations and leg swelling.  Gastrointestinal: Negative for nausea and vomiting.  Genitourinary: Negative for dysuria.  Musculoskeletal: Negative for joint swelling.  Skin: Negative for rash.  Neurological: Negative for headaches.  Hematological: Does not bruise/bleed easily.  Psychiatric/Behavioral: Negative for dysphoric mood. The patient is not nervous/anxious.        Objective:   Physical Exam  amb wf smoker's voice/ mild rattling cough   Wt Readings from Last 3 Encounters:  09/03/15 129 lb 9.6 oz (58.786 kg)  06/14/15 130 lb (58.968 kg)  09/12/14 135 lb (61.236 kg)    Vital signs reviewed   HEENT: nl dentition, turbinates, and oropharynx. Nl external ear canals without cough reflex   NECK :  without JVD/Nodes/TM/ nl carotid upstrokes bilaterally   LUNGS: no acc muscle use,  Nl contour chest which is clear to A and P bilaterally without cough  on insp or exp maneuvers   CV:  RRR  no s3 or murmur or increase in P2, no edema   ABD:  soft and nontender with nl inspiratory excursion in the supine position. No bruits or organomegaly, bowel sounds nl  MS:  Nl gait/ ext warm without deformities, calf tenderness, cyanosis or clubbing No obvious joint restrictions   SKIN: warm and dry without lesions    NEURO:  alert, approp, nl sensorium with  no motor deficits      cxr per Dr Renne CriglerPharr ok      Assessment & Plan:

## 2015-09-04 ENCOUNTER — Encounter: Payer: Self-pay | Admitting: Internal Medicine

## 2015-09-04 DIAGNOSIS — R05 Cough: Secondary | ICD-10-CM | POA: Insufficient documentation

## 2015-09-04 DIAGNOSIS — R059 Cough, unspecified: Secondary | ICD-10-CM | POA: Insufficient documentation

## 2015-09-04 DIAGNOSIS — F17210 Nicotine dependence, cigarettes, uncomplicated: Secondary | ICD-10-CM | POA: Insufficient documentation

## 2015-09-04 DIAGNOSIS — J45909 Unspecified asthma, uncomplicated: Secondary | ICD-10-CM | POA: Insufficient documentation

## 2015-09-04 NOTE — Assessment & Plan Note (Signed)
>   3 min discussion  I emphasized that although we never turn away smokers from the pulmonary clinic, we do ask that they understand that the recommendations that we make  won't work nearly as well in the presence of continued cigarette exposure.  In fact, we may very well  reach a point where we can't promise to help the patient if he/she can't quit smoking. (We can and will promise to try to help, we just can't promise what we recommend will really work)   Suggested e cigs as a one way bridge off cigarettes permanently

## 2015-09-04 NOTE — Assessment & Plan Note (Addendum)
DDX of  difficult airways management all start with A and  include Adherence, Ace Inhibitors, Acid Reflux, Active Sinus Disease, Alpha 1 Antitripsin deficiency, Anxiety masquerading as Airways dz,  ABPA,  allergy(esp in young), Aspiration (esp in elderly), Adverse effects of meds,  Active smokers, A bunch of PE's (a small clot burden can't cause this syndrome unless there is already severe underlying pulm or vascular dz with poor reserve) plus two Bs  = Bronchiectasis and Beta blocker use..and one C= CHF  Adherence is always the initial "prime suspect" and is a multilayered concern that requires a "trust but verify" approach in every patient - starting with knowing how to use medications, especially inhalers, correctly, keeping up with refills and understanding the fundamental difference between maintenance and prns vs those medications only taken for a very short course and then stopped and not refilled.  - The proper method of use, as well as anticipated side effects, of a metered-dose inhaler are discussed and demonstrated to the patient. Improved effectiveness after extensive coaching during this visit to a level of approximately 75 % from a baseline of 25% > try low dose ics/laba so as not to trigger more coughing > dulera 100 2bid until returns for pfts   Active smoking other obvious issue > see sep a/p  ? Active sinus dz > Augmentin 875 mg take one pill twice daily  X 10 days - take at breakfast and supper with large glass of water.  It would help reduce the usual side effects (diarrhea and yeast infections) if you ate cultured yogurt at lunch.   ? Allergies > suggested by resp to prednisone but   doubt suddenly allergic to mold given no environmental hx to suggest new exposure and allergy w/u's rarely fruitful in active smokers so defer rx for now in favor of just using ICS

## 2015-09-04 NOTE — Assessment & Plan Note (Signed)
Probably mostly AB so rx this first pending f/u > try mucinex dm prn in meantime

## 2015-10-20 ENCOUNTER — Ambulatory Visit (INDEPENDENT_AMBULATORY_CARE_PROVIDER_SITE_OTHER): Payer: BLUE CROSS/BLUE SHIELD | Admitting: Internal Medicine

## 2015-10-20 ENCOUNTER — Encounter: Payer: Self-pay | Admitting: Internal Medicine

## 2015-10-20 VITALS — BP 122/82 | HR 89 | Ht 64.0 in | Wt 127.0 lb

## 2015-10-20 DIAGNOSIS — J4520 Mild intermittent asthma, uncomplicated: Secondary | ICD-10-CM | POA: Diagnosis not present

## 2015-10-20 DIAGNOSIS — R05 Cough: Secondary | ICD-10-CM

## 2015-10-20 DIAGNOSIS — R059 Cough, unspecified: Secondary | ICD-10-CM

## 2015-10-20 DIAGNOSIS — F17210 Nicotine dependence, cigarettes, uncomplicated: Secondary | ICD-10-CM

## 2015-10-20 LAB — PULMONARY FUNCTION TEST
DL/VA % pred: 70 %
DL/VA: 3.38 ml/min/mmHg/L
DLCO unc % pred: 64 %
DLCO unc: 15.65 ml/min/mmHg
FEF 25-75 Post: 3.5 L/sec
FEF 25-75 Pre: 2.99 L/sec
FEF2575-%Change-Post: 17 %
FEF2575-%Pred-Post: 141 %
FEF2575-%Pred-Pre: 120 %
FEV1-%Change-Post: 3 %
FEV1-%Pred-Post: 111 %
FEV1-%Pred-Pre: 107 %
FEV1-Post: 2.93 L
FEV1-Pre: 2.83 L
FEV1FVC-%Change-Post: 6 %
FEV1FVC-%Pred-Pre: 103 %
FEV6-%Change-Post: 0 %
FEV6-%Pred-Post: 102 %
FEV6-%Pred-Pre: 103 %
FEV6-Post: 3.37 L
FEV6-Pre: 3.41 L
FEV6FVC-%Pred-Post: 103 %
FEV6FVC-%Pred-Pre: 103 %
FVC-%Change-Post: -2 %
FVC-%Pred-Post: 99 %
FVC-%Pred-Pre: 102 %
FVC-Post: 3.37 L
FVC-Pre: 3.47 L
Post FEV1/FVC ratio: 87 %
Post FEV6/FVC ratio: 100 %
Pre FEV1/FVC ratio: 82 %
Pre FEV6/FVC Ratio: 100 %
RV % pred: 89 %
RV: 1.74 L
TLC % pred: 100 %
TLC: 5.06 L

## 2015-10-20 NOTE — Progress Notes (Signed)
Subjective:    Patient ID: Felicia Wilcox, female    DOB: Sep 07, 1961      MRN: [id number]   Brief patient profile:  25 yowf active smoker with no resp problems at all until early Nov 2016 acutely ill flu like illness > head/ chest congestion with green mucus no better on zpak, some better on prednisone but persistent cough/ wheeze/sob so referred to pulmonary clinic 09/03/2015 by Dr Renne Crigler with nl pfts 10/20/15    09/03/2015 1st [facility] [place] office visit/ Ronalee Scheunemann   Chief Complaint  Patient presents with  . PULMONARY CONSULT    Pt referred by Dr. Renne Crigler for asthma: pt states about 6 weeks ago she came down with something her chest and head clogged up. pt was prescribed a z pak that didnt help much. pt also completed a course of prednisone that did help however she is stil coughing up greenish in color. pt states that there is a lot of mold in her house and thinks this may be a part of the issues she is having. pt c/o some wheezing at night and a tiny bit of chest tightness.   75% better from pred, finished one week prior to OV  And losing some ground back to 50% level since ran out with worse cough and sob /wheeze  "nothing else makes it better" ? Has used saba - she's not sure Mucus is mostly green in am and less so during the day, sleeping better now but when acutely ill  did not Main symptom is head congestion / L ear ache  rec Augmentin 875 mg take one pill twice daily  X 10 days   Best cough medicine > mucinex or mucinex dm 1200 mg every 12 hours dulera 100 Take 2 puffs first thing in am and then another 2 puffs about 12 hours later.  Only use your albuterol as a rescue medication The key is to stop smoking completely before smoking completely stops you! - ok to try e cigs as one way bridge off cigarettes      10/20/2015  f/u ov/Keirstyn Aydt re: AB on dulera 100 2 bid no need for saba Chief Complaint  Patient presents with  . Follow-up    Review PFT. No new complaints.      Not limited by  breathing from desired activities  / cough better   No obvious day to day or daytime variability or assoc chronic cough or cp or chest tightness, subjective wheeze or overt sinus or hb symptoms. No unusual exp hx or h/o childhood pna/ asthma or knowledge of premature birth.  Sleeping ok without nocturnal  or early am exacerbation  of respiratory  c/o's or need for noct saba. Also denies any obvious fluctuation of symptoms with weather or environmental changes or other aggravating or alleviating factors except as outlined above   Current Medications, Allergies, Complete Past Medical History, Past Surgical History, Family History, and Social History were reviewed in [employer] [facility] record.  ROS  The following are not active complaints unless bolded sore throat, dysphagia, dental problems, itching, sneezing,  nasal congestion or excess/ purulent secretions, ear ache,   fever, chills, sweats, unintended wt loss, classically pleuritic or exertional cp, hemoptysis,  orthopnea pnd or leg swelling, presyncope, palpitations, abdominal pain, anorexia, nausea, vomiting, diarrhea  or change in bowel or bladder habits, change in stools or urine, dysuria,hematuria,  rash, arthralgias, visual complaints, headache, numbness, weakness or ataxia or problems with walking or coordination,  change  in mood/affect or memory.                    Objective:   Physical Exam  amb wf smoker's voice/ mild rattling cough   10/20/2015        127   09/03/15 129 lb 9.6 oz (58.786 kg)  06/14/15 130 lb (58.968 kg)  09/12/14 135 lb (61.236 kg)    Vital signs reviewed   HEENT: nl dentition, turbinates, and oropharynx. Nl external ear canals without cough reflex   NECK :  without JVD/Nodes/TM/ nl carotid upstrokes bilaterally   LUNGS: no acc muscle use,  Nl contour chest which is clear to A and P bilaterally without cough on insp or exp maneuvers   CV:  RRR  no s3 or murmur or increase in P2, no  edema   ABD:  soft and nontender with nl inspiratory excursion in the supine position. No bruits or organomegaly, bowel sounds nl  MS:  Nl gait/ ext warm without deformities, calf tenderness, cyanosis or clubbing No obvious joint restrictions   SKIN: warm and dry without lesions    NEURO:  alert, approp, nl sensorium with  no motor deficits     I personally reviewed images and agree with radiology impression as follows:  CXR:  06/14/15  No active cardiopulmonary disease.     Assessment & Plan:

## 2015-10-20 NOTE — Patient Instructions (Addendum)
You do not have significant chronic lung disease at this point and likely never will if you stop smoking now - good luck

## 2015-10-20 NOTE — Progress Notes (Signed)
PFT done today. 

## 2015-10-25 ENCOUNTER — Encounter: Payer: Self-pay | Admitting: Internal Medicine

## 2015-10-25 NOTE — Assessment & Plan Note (Signed)

## 2015-10-25 NOTE — Assessment & Plan Note (Addendum)
09/03/2015  extensive coaching HFA effectiveness =    75%  - 10/20/2015  PFT's  2.93 (111%) ratio 87 and dlco 64 corrects to 70% s dulera am of study   Clearly this is not copd and I can't even detect any AB component today with no am dulera today but by hx does have variable ab which would probably resolve if she stops smoking now and could progess to copd if she doesn't  I had an extended final summary discussion with the patient reviewing all relevant studies completed to date and  lasting 15 to 20 minutes of a 25 minute visit on the following issues:    Smoking cessation is the key  Ok to continue to use dulera 100 up to 2 every 12 hours if she feels it helps her breathing  Confirmed getting at least 75% of hfa delivered with each trigger   Each maintenance medication was reviewed in detail including most importantly the difference between maintenance and as needed and under what circumstances the prns are to be used.  Please see instructions for details which were reviewed in writing and the patient given a copy.    Pulmonary f/u is prn

## 2015-11-11 ENCOUNTER — Ambulatory Visit (INDEPENDENT_AMBULATORY_CARE_PROVIDER_SITE_OTHER): Payer: BLUE CROSS/BLUE SHIELD | Admitting: Neurology

## 2015-11-11 ENCOUNTER — Encounter: Payer: Self-pay | Admitting: Neurology

## 2015-11-11 ENCOUNTER — Telehealth: Payer: Self-pay

## 2015-11-11 VITALS — BP 104/78 | HR 70 | Resp 16 | Ht 63.0 in | Wt 128.0 lb

## 2015-11-11 DIAGNOSIS — R413 Other amnesia: Secondary | ICD-10-CM

## 2015-11-11 NOTE — Progress Notes (Signed)
Subjective:    Patient ID: Felicia Wilcox is a 55 y.o. female.  HPI     Huston Foley, MD, PhD [facility] [address], [address] [address] [place], [place] [address]  Dear Dr. Renne Crigler,    I saw your patient, Felicia Wilcox, upon your kind request in my neurologic clinic today for initial consultation of her memory loss. The patient is unaccompanied today. As you know, [name] is a 55 year old right-handed woman with an underlying medical history of reflux disease, anxiety, low back pain, neck pain with status post neck surgery, smoking, alcohol dependence with recent inpatient rehabilitation (at [facility] from 09/15/15, then transferred on 09/19/15, then D/C from [facility]), obstructive sleep apnea (not on CPAP, could not afford dental appliance), and asthmatic bronchitis for which she has seen Dr. Sherene Sires in pulmonology, who reports memory issues for the past 2 months or so. She reports forgetfulness primarily, no confusion or disorientation, difficulty recalling names of people and places, not keeping up with her work. She lives alone, was divorced a long time ago, has one son, age 76 and has one 59 yo GS (in [place]). She smokes 1/2 ppd.  She quit drinking alcohol since her rehab stay.  She reports a family history of dementia which is not further specified in 2 of her maternal uncles, one of whom committed suicide in his late 43s.  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   I reviewed Dr. Thurston Hole office note from 10/20/2015. Patient was advised that she did not have COPD. She was counseled on smoking cessation. She drinks caffeine in the form of coffee, 2-3 cups per day. She tries to drink enough water. I reviewed your office note from 10/27/2015, which you kindly included.   She had a head CT and cervical spine CT without contrast on 06/14/2015 after a fall. She presented to the emergency room at the time. I reviewed the  emergency room records. Head CT and cervical spine CT without contrast from 06/13/2016 showed: IMPRESSION: No acute intracranial pathology. C3-C6 anterior fusion plate and screws. No acute/ traumatic cervical spine pathology.  She had a CTH wo contrast on 09/19/15: neg per report (I reviewed records she brought).  I do not have a D/C summary to reviewed.   Her Past Medical History Is Significant For: Past Medical History  Diagnosis Date  . Smoker   . Anxiety   . Sleep apnea     mild - does not use CPAP machine  . GERD (gastroesophageal reflux disease)   . Arthritis     2 herniated disc lower back  . Chronic lower back pain     Her Past Surgical History Is Significant For: Past Surgical History  Procedure Laterality Date  . Abalation     . Neck surgery      ? C3-4-5 with rods  . Tonsillectomy    . Wisdom tooth extraction    . Cesarean section    . Back surgery      x 3 - branch block lower back injections -block nerve endings  . Hysteroscopy N/A 09/13/2014    Procedure: HYSTEROSCOPY / REMOVAL OF INTRAUTERINE DEVICE;  Surgeon: Antionette Char, MD;  Location: [facility];  Service: Gynecology;  Laterality: N/A;  IUD removal  . Bartholin gland cyst excision      Her Family History Is Significant For: Family History  Problem Relation Age of Onset  . Heart disease Mother   .  Cancer Father   . Diabetes Maternal Grandmother   . Stroke Sister   . Heart disease Sister     Her Social History Is Significant For: Social History   Social History  . Marital Status: Single    Spouse Name: N/A  . Number of Children: 1  . Years of Education: BA   Social History Main Topics  . Smoking status: Current Every Day Smoker -- 0.50 packs/day for 6 years    Types: Cigarettes  . Smokeless tobacco: Never Used  . Alcohol Use: No     Comment: Quits 1 month ago.  . Drug Use: No  . Sexual Activity: No   Other Topics Concern  . None   Social History Narrative   Drinks 2-3 cups of  coffee a day     Her Allergies Are:  Allergies  Allergen Reactions  . Aspartame And Phenylalanine Anaphylaxis  . Codeine     "Bleed internally"   . Sulfa Antibiotics Nausea And Vomiting  :   Her Current Medications Are:  Outpatient Encounter Prescriptions as of 11/11/2015  Medication Sig  . BLACK COHOSH PO Take 1 tablet by mouth daily.  Marland Kitchen buPROPion (WELLBUTRIN XL) 150 MG 24 hr tablet Take 150 mg by mouth 2 (two) times daily.  . Multiple Vitamin (MULTIVITAMIN WITH MINERALS) TABS tablet Take 1 tablet by mouth daily.  . Zinc Acetate, Oral, (ZINC ACETATE PO) Take 1 tablet by mouth daily.  . [DISCONTINUED] Ascorbic Acid (VITAMIN C PO) Take 1 tablet by mouth daily.   No facility-administered encounter medications on file as of 11/11/2015.  :  Review of Systems:  Out of a complete 14 point review of systems, all are reviewed and negative with the exception of these symptoms as listed below:    Review of Systems  Musculoskeletal:       Joint pain, aching muscles   Neurological: Positive for weakness.       Patient has noticed some increased short term memory loss recently. States that she was overdosed by a rehab facility and since then, memory loss has increased.   Psychiatric/Behavioral:       Anxiety    Objective:  Neurologic Exam  Physical Exam Physical Examination:   Filed Vitals:   11/11/15 0909  BP: 104/78  Pulse: 70  Resp: 16   General Examination: The patient is a very pleasant 55 y.o. female in no acute distress. She appears well-developed and well-nourished and well groomed.   HEENT: Normocephalic, atraumatic, pupils are equal, round and reactive to light and accommodation. Funduscopic exam is normal with sharp disc margins noted. Extraocular tracking is good without limitation to gaze excursion or nystagmus noted. Normal smooth pursuit is noted. Hearing is grossly intact. Tympanic membranes are clear bilaterally. Face is symmetric with normal facial animation and  normal facial sensation. Speech is clear with no dysarthria noted. There is no hypophonia. There is no lip, neck/head, jaw or voice tremor. Neck is supple with full range of passive and active motion. There are no carotid bruits on auscultation. Oropharynx exam reveals: mild mouth dryness, adequate dental hygiene and mild airway crowding, due to narrow airway entry. Mallampati is class II. Tongue protrudes centrally and palate elevates symmetrically.   Chest: Clear to auscultation without wheezing, rhonchi or crackles noted.  Heart: S1+S2+0, regular and normal without murmurs, rubs or gallops noted.   Abdomen: Soft, non-tender and non-distended with normal bowel sounds appreciated on auscultation.  Extremities: There is no pitting edema in the distal  lower extremities bilaterally. Pedal pulses are intact.  Skin: Warm and dry without trophic changes noted. There are no varicose veins.  Musculoskeletal: exam reveals no obvious joint deformities, tenderness or joint swelling or erythema.   Neurologically:  Mental status: The patient is awake, alert and oriented in all 4 spheres. Her immediate and remote memory, attention, language skills and fund of knowledge are appropriate. There is no evidence of aphasia, agnosia, apraxia or anomia. Speech is clear with normal prosody and enunciation. Thought process is linear.  On 11/11/2015: MMSE: 30/30, CDT: 4/4, AFT: 15/min. Mood is constricted and affect is constricted, frustrated appearing.  Cranial nerves II - XII are as described above under HEENT exam. In addition: shoulder shrug is normal with equal shoulder height noted. Motor exam: Normal bulk, strength and tone is noted. There is no drift, tremor or rebound. Romberg is negative. Reflexes are 2+ throughout. Babinski: Toes are flexor bilaterally. Fine motor skills and coordination: intact with normal finger taps, normal hand movements, normal rapid alternating patting, normal foot taps and normal foot  agility.  Cerebellar testing: No dysmetria or intention tremor on finger to nose testing. Heel to shin is unremarkable bilaterally. There is no truncal or gait ataxia.  Sensory exam: intact to light touch, pinprick, vibration, temperature sense in the upper and lower extremities.  Gait, station and balance: She stands easily. No veering to one side is noted. No leaning to one side is noted. Posture is age-appropriate and stance is narrow based. Gait shows normal stride length and normal pace. No problems turning are noted. She turns en bloc. Tandem walk is unremarkable. Intact toe and heel stance is noted.               Assessment and Plan:   In summary, MAKALYN LENNOX is a very pleasant 55 y.o.-year old female with an underlying medical history of reflux disease, anxiety, low back pain, neck pain with status post neck surgery, smoking, alcohol dependence with recent inpatient rehabilitation, who presents with a complaint of short-term memory problems for the past 2 months. We would like to request records from her stay at [facility] , but the patient would like to get her own records from [place] and [place] first, then bring them to me.    On examination, she has benign findings, a nonfocal neurological exam. Memory scores are benign as well. She is reassured in that regard. Nevertheless, we will proceed with further testing. We will do blood work,  and I suggested proceeding with a noncontrasted brain MRI - but the patient states she is participating in a study for her lower back pain involving stem cells, and as part of the study she may have had a brain MRI. She will try to find out and let me know. She had a recent noncontrasted head CT with benign findings. We may consider formal neuropsychological evaluation in the near future. We will monitor her memory scores. We will not start any new medication as yet but I did advise patient to refrain from alcohol consumption altogether, stop smoking,  ensure good nutrition and proper hydration with water, limit caffeine intake.  I had a long chat with the patient about my findings and the complaint of memory loss.  We will call her with her test results. Should she change her mind regarding records from [facility] we will be happy to request those to complete our records here. We will do blood work today and call her with the results most  likely towards the end of the week.  I will see her back routinely in 3-4 months, sooner if needed. I answered all her questions today and the patient was in agreement. Thank you very much for allowing me to participate in the care of this nice patient. If I can be of any further assistance to you please do not hesitate to call me at [phone].  Sincerely,   Huston Foley, MD, PhD

## 2015-11-11 NOTE — Telephone Encounter (Signed)
I faxed ROI form that patient signed to [facility] and [facility] for recent records.  [facility] Fax: [phone] [facility] Fax [phone]

## 2015-11-11 NOTE — Patient Instructions (Addendum)
You have complaints of memory loss: memory loss or changes in cognitive function can have many reasons and does not always mean you have dementia. Conditions that can contribute to subjective or objective memory loss include: depression, stress, poor sleep from insomnia or sleep apnea, dehydration, fluctuation in blood sugar values, thyroid or electrolyte dysfunction and certain vitamin deficiencies. Dementia can be caused by stroke, brain atherosclerosis or brain vascular disease due to vascular risk factors (smoking, high blood pressure, high cholesterol, obesity and uncontrolled diabetes), certain degenerative brain disorders (including Parkinson's disease and Multiple sclerosis) and by Alzheimer's disease or other, more rare and sometimes hereditary causes. We will do some additional testing: blood work (which we can do today) and we will do a brain scan, called MRI. We may not have to do the MRI, as you may have had one before. Let us know, if you find out.  We will not start medication at this time and monitor your symptoms. We may also request a formal cognitive test called neuropsychological evaluation which is done by a licensed neuropsychologist. We will consider a referral in the near future for this. We will call you with brain scan test results and blood test results. Your memory loss is rather mild at this point, which, of course is reassuring. Please refrain from alcohol consumption altogether. Please stop smoking. Please ensure good nutrition and proper hydration with drinking 6-8 glasses of water per day. Limit caffeine intake.

## 2015-11-12 NOTE — Progress Notes (Signed)
Quick Note:  Labs fine, with the exception of HbA1c in the pre-diabetes range and vit D in the low normal range, a couple of results are still pending, will call if abnormal.  Huston Foley, MD, PhD [facility]  ______

## 2015-11-13 ENCOUNTER — Telehealth: Payer: Self-pay

## 2015-11-13 DIAGNOSIS — R413 Other amnesia: Secondary | ICD-10-CM

## 2015-11-13 NOTE — Progress Notes (Signed)
Quick Note:  Please call pt: Vitamin B6 and vitamin B12 level elevated. This could be from taking additional over-the-counter vitamin B supplements. She can check with primary care physician if she needs to continue with B vitamins. Huston Foley, MD, PhD [facility]  ______

## 2015-11-13 NOTE — Telephone Encounter (Signed)
-----   Message from Huston Foley, MD sent at 11/13/2015  3:04 PM EST ----- Please call pt: Vitamin B6 and vitamin B12 level elevated. This could be from taking additional over-the-counter vitamin B supplements. She can check with primary care physician if she needs to continue with B vitamins. Huston Foley, MD, PhD [facility])

## 2015-11-13 NOTE — Telephone Encounter (Signed)
I put an order in for brain MRI.

## 2015-11-13 NOTE — Telephone Encounter (Signed)
Spoke to pt and advised her that her vitamin b6 and b12 were elevated and this could be from taking OTC vitamin B supplements and that she should check with her PCP to find out if she needs to continue the B vitamins. Pt reports that she is taking OTC vitamin B and asked that I fax these results to Dr. Renne Crigler. Pt asked what this means for her memory loss? I advised her that I see in her chart that Dr. Frances Furbish ordered an MRI of pt's brain to be done at [facility] and I advised her that when Dr. Frances Furbish gets the results of her MRI brain, Dr. Frances Furbish would have a better understanding of the bigger picture and would be able to discuss the implications of all these labs and results and her next appt. . I advised her that the MRI Brain will be scheduled by [facility]. Pt verbalized understanding and had no further questions or concerns at this time.

## 2015-11-13 NOTE — Telephone Encounter (Signed)
-----   Message from Huston Foley, MD sent at 11/12/2015  8:02 AM EST ----- Labs fine, with the exception of HbA1c in the pre-diabetes range and vit D in the low normal range, a couple of results are still pending, will call if abnormal.  Huston Foley, MD, PhD [facility]

## 2015-11-13 NOTE — Telephone Encounter (Signed)
Patient is aware of results and voiced understanding. She states that [facility] called her back and she has not had any MRI of the brain. I advised her that we are still waiting for records to be faxed in.

## 2015-11-14 LAB — CBC WITH DIFFERENTIAL/PLATELET
Basophils Absolute: 0 10*3/uL (ref 0.0–0.2)
Basos: 1 %
EOS (ABSOLUTE): 0.1 10*3/uL (ref 0.0–0.4)
Eos: 2 %
Hematocrit: 44.1 % (ref 34.0–46.6)
Hemoglobin: 15 g/dL (ref 11.1–15.9)
Immature Grans (Abs): 0 10*3/uL (ref 0.0–0.1)
Immature Granulocytes: 0 %
Lymphocytes Absolute: 1.4 10*3/uL (ref 0.7–3.1)
Lymphs: 25 %
MCH: 32.6 pg (ref 26.6–33.0)
MCHC: 34 g/dL (ref 31.5–35.7)
MCV: 96 fL (ref 79–97)
Monocytes Absolute: 0.5 10*3/uL (ref 0.1–0.9)
Monocytes: 9 %
Neutrophils Absolute: 3.6 10*3/uL (ref 1.4–7.0)
Neutrophils: 63 %
Platelets: 313 10*3/uL (ref 150–379)
RBC: 4.6 x10E6/uL (ref 3.77–5.28)
RDW: 12.2 % — ABNORMAL LOW (ref 12.3–15.4)
WBC: 5.7 10*3/uL (ref 3.4–10.8)

## 2015-11-14 LAB — COMPREHENSIVE METABOLIC PANEL
ALT: 34 IU/L — ABNORMAL HIGH (ref 0–32)
AST: 17 IU/L (ref 0–40)
Albumin/Globulin Ratio: 2.5 (ref 1.1–2.5)
Albumin: 4.7 g/dL (ref 3.5–5.5)
Alkaline Phosphatase: 44 IU/L (ref 39–117)
BUN/Creatinine Ratio: 17 (ref 9–23)
BUN: 12 mg/dL (ref 6–24)
Bilirubin Total: 0.2 mg/dL (ref 0.0–1.2)
CO2: 25 mmol/L (ref 18–29)
Calcium: 10.1 mg/dL (ref 8.7–10.2)
Chloride: 102 mmol/L (ref 96–106)
Creatinine, Ser: 0.71 mg/dL (ref 0.57–1.00)
GFR calc Af Amer: 110 mL/min/{1.73_m2} (ref >59)
GFR calc non Af Amer: 96 mL/min/{1.73_m2} (ref >59)
Globulin, Total: 1.9 g/dL (ref 1.5–4.5)
Glucose: 93 mg/dL (ref 65–99)
Potassium: 4.6 mmol/L (ref 3.5–5.2)
Sodium: 140 mmol/L (ref 134–144)
Total Protein: 6.6 g/dL (ref 6.0–8.5)

## 2015-11-14 LAB — C-REACTIVE PROTEIN: CRP: 0.8 mg/L (ref 0.0–4.9)

## 2015-11-14 LAB — B12 AND FOLATE PANEL
Folate: >20 ng/mL (ref >3.0)
Vitamin B-12: >2000 pg/mL — ABNORMAL HIGH (ref 211–946)

## 2015-11-14 LAB — VITAMIN B1: Thiamine: 223.3 nmol/L — ABNORMAL HIGH (ref 66.5–200.0)

## 2015-11-14 LAB — ANA W/REFLEX: Anti Nuclear Antibody(ANA): NEGATIVE

## 2015-11-14 LAB — VITAMIN D 25 HYDROXY (VIT D DEFICIENCY, FRACTURES): Vit D, 25-Hydroxy: 36.1 ng/mL (ref 30.0–100.0)

## 2015-11-14 LAB — TSH: TSH: 2.07 u[IU]/mL (ref 0.450–4.500)

## 2015-11-14 LAB — RPR: RPR Ser Ql: NONREACTIVE

## 2015-11-14 LAB — SEDIMENTATION RATE: Sed Rate: 2 mm/hr (ref 0–40)

## 2015-11-14 LAB — VITAMIN B6: Vitamin B6: >100 ug/L — ABNORMAL HIGH (ref 2.0–32.8)

## 2015-11-14 LAB — HGB A1C W/O EAG: Hgb A1c MFr Bld: 5.8 % — ABNORMAL HIGH (ref 4.8–5.6)

## 2015-11-17 NOTE — Telephone Encounter (Signed)
Spoke to pt and advised her that her vitamin b6 and b12 were elevated and this could be from taking OTC vitamin B supplements and that she should check with her PCP to find out if she needs to continue the B vitamins. Pt reports that she is taking OTC vitamin B and asked that I fax these results to Dr. Pharr. Pt asked what this means for her memory loss? I advised her that I see in her chart that Dr. Athar ordered an MRI of pt's brain to be done at [facility] and I advised her that when Dr. Athar gets the results of her MRI brain, Dr. Athar would have a better understanding of the bigger picture and would be able to discuss the implications of all these labs and results and her next appt. . I advised her that the MRI Brain will be scheduled by [facility]. Pt verbalized understanding and had no further questions or concerns at this time. 

## 2016-01-06 DIAGNOSIS — F419 Anxiety disorder, unspecified: Secondary | ICD-10-CM | POA: Diagnosis not present

## 2016-01-22 DIAGNOSIS — R5383 Other fatigue: Secondary | ICD-10-CM | POA: Diagnosis not present

## 2016-01-22 DIAGNOSIS — R599 Enlarged lymph nodes, unspecified: Secondary | ICD-10-CM | POA: Diagnosis not present

## 2016-01-27 DIAGNOSIS — G894 Chronic pain syndrome: Secondary | ICD-10-CM | POA: Diagnosis not present

## 2016-01-27 DIAGNOSIS — S3210XS Unspecified fracture of sacrum, sequela: Secondary | ICD-10-CM | POA: Diagnosis not present

## 2016-01-27 DIAGNOSIS — M47816 Spondylosis without myelopathy or radiculopathy, lumbar region: Secondary | ICD-10-CM | POA: Diagnosis not present

## 2016-01-27 DIAGNOSIS — M545 Low back pain: Secondary | ICD-10-CM | POA: Diagnosis not present

## 2016-01-29 DIAGNOSIS — Z6821 Body mass index (BMI) 21.0-21.9, adult: Secondary | ICD-10-CM | POA: Diagnosis not present

## 2016-01-29 DIAGNOSIS — K111 Hypertrophy of salivary gland: Secondary | ICD-10-CM | POA: Diagnosis not present

## 2016-01-29 DIAGNOSIS — J387 Other diseases of larynx: Secondary | ICD-10-CM | POA: Diagnosis not present

## 2016-01-29 DIAGNOSIS — F17200 Nicotine dependence, unspecified, uncomplicated: Secondary | ICD-10-CM | POA: Diagnosis not present

## 2016-03-09 ENCOUNTER — Ambulatory Visit: Payer: BLUE CROSS/BLUE SHIELD | Admitting: Neurology

## 2016-04-26 DIAGNOSIS — Z0000 Encounter for general adult medical examination without abnormal findings: Secondary | ICD-10-CM | POA: Diagnosis not present

## 2016-05-07 DIAGNOSIS — M79644 Pain in right finger(s): Secondary | ICD-10-CM | POA: Diagnosis not present

## 2016-05-07 DIAGNOSIS — M79645 Pain in left finger(s): Secondary | ICD-10-CM | POA: Diagnosis not present

## 2016-05-07 DIAGNOSIS — Z0000 Encounter for general adult medical examination without abnormal findings: Secondary | ICD-10-CM | POA: Diagnosis not present

## 2016-05-07 DIAGNOSIS — F17200 Nicotine dependence, unspecified, uncomplicated: Secondary | ICD-10-CM | POA: Diagnosis not present

## 2016-06-28 DIAGNOSIS — M79645 Pain in left finger(s): Secondary | ICD-10-CM | POA: Diagnosis not present

## 2016-06-28 DIAGNOSIS — M1811 Unilateral primary osteoarthritis of first carpometacarpal joint, right hand: Secondary | ICD-10-CM | POA: Diagnosis not present

## 2016-06-28 DIAGNOSIS — M79644 Pain in right finger(s): Secondary | ICD-10-CM | POA: Diagnosis not present

## 2016-07-06 ENCOUNTER — Emergency Department (HOSPITAL_BASED_OUTPATIENT_CLINIC_OR_DEPARTMENT_OTHER)
Admission: EM | Admit: 2016-07-06 | Discharge: 2016-07-06 | Disposition: A | Discharge status: Home / Self Care | Payer: BLUE CROSS/BLUE SHIELD | Attending: Emergency Medicine | Admitting: Emergency Medicine

## 2016-07-06 ENCOUNTER — Encounter (HOSPITAL_BASED_OUTPATIENT_CLINIC_OR_DEPARTMENT_OTHER): Payer: Self-pay | Admitting: *Deleted

## 2016-07-06 DIAGNOSIS — F17210 Nicotine dependence, cigarettes, uncomplicated: Secondary | ICD-10-CM | POA: Diagnosis not present

## 2016-07-06 DIAGNOSIS — L509 Urticaria, unspecified: Secondary | ICD-10-CM | POA: Diagnosis not present

## 2016-07-06 DIAGNOSIS — R21 Rash and other nonspecific skin eruption: Secondary | ICD-10-CM | POA: Insufficient documentation

## 2016-07-06 MED ORDER — CLOTRIMAZOLE-BETAMETHASONE 1-0.05 % EX CREA: TOPICAL_CREAM | CUTANEOUS | 0 refills | Status: DC

## 2016-07-06 MED FILL — CLOTRIMAZOLE-BETAMETHASONE: 1-0.05 | 30 days supply | Qty: 15 | Fill #0

## 2016-07-06 NOTE — Discharge Instructions (Signed)
Apply topical cream twice a day. If symptoms do not improve, please follow up with your primary care provider.  Return to ER for new or worsening symptoms, any additional concerns.

## 2016-07-06 NOTE — ED Triage Notes (Signed)
Patient c/o R hand pain that extends from thumg and up forearm. She states that she thinks something bit her because she has a red rash on her thumb and there is burning that extends up her arm

## 2016-07-06 NOTE — ED Provider Notes (Signed)
[facility] Provider Note   CSN: [id number] Arrival date & time: 07/06/16  0946     History   Chief Complaint Chief Complaint  Patient presents with  . Rash    HPI Felicia Wilcox is a 55 y.o. female.  The history is provided by the patient and medical records. No language interpreter was used.  Rash     Felicia Wilcox is a 55 y.o. female  with a PMH of anxiety, GERD who presents to the Emergency Department complaining of an unchanged pruritic right hand rash that occurred two days days ago. She believes that she was bitten by something because she immediately started itching when skin lesion developed. Itching extends up the forearm. No fevers/chills. No surrounding or spreading erythema. No drainage.   Past Medical History:  Diagnosis Date  . Anxiety   . Arthritis    2 herniated disc lower back  . Chronic lower back pain   . GERD (gastroesophageal reflux disease)   . Sleep apnea    mild - does not use CPAP machine  . Smoker     Patient Active Problem List   Diagnosis Date Noted  . Asthmatic bronchitis 09/04/2015  . Cigarette smoker 09/04/2015  . Cough 09/04/2015  . Encounter for IUD removal 09/13/2014  . Vaginitis and vulvovaginitis, unspecified 05/17/2013  . Laceration of finger 03/03/2013  . Pain in finger 03/03/2013    Past Surgical History:  Procedure Laterality Date  . Abalation     . BACK SURGERY     x 3 - branch block lower back injections -block nerve endings  . BARTHOLIN GLAND CYST EXCISION    . CESAREAN SECTION    . HYSTEROSCOPY N/A 09/13/2014   Procedure: HYSTEROSCOPY / REMOVAL OF INTRAUTERINE DEVICE;  Surgeon: Antionette Char, MD;  Location: [facility];  Service: Gynecology;  Laterality: N/A;  IUD removal  . NECK SURGERY     ? C3-4-5 with rods  . TONSILLECTOMY    . WISDOM TOOTH EXTRACTION      OB History    No data available       Home Medications    Prior to Admission medications   Medication Sig Start Date End Date  Taking? Authorizing Provider  BLACK COHOSH PO Take 1 tablet by mouth daily.    Historical Provider, MD  buPROPion (WELLBUTRIN XL) 150 MG 24 hr tablet Take 150 mg by mouth 2 (two) times daily.    Historical Provider, MD  clotrimazole-betamethasone (LOTRISONE) cream Apply to affected area 2 times daily prn 07/06/16   [facility] Marlissa Emerick, PA-C  Multiple Vitamin (MULTIVITAMIN WITH MINERALS) TABS tablet Take 1 tablet by mouth daily.    Historical Provider, MD  Zinc Acetate, Oral, (ZINC ACETATE PO) Take 1 tablet by mouth daily.    Historical Provider, MD    Family History Family History  Problem Relation Age of Onset  . Heart disease Mother   . Cancer Father   . Diabetes Maternal Grandmother   . Stroke Sister   . Heart disease Sister     Social History Social History  Substance Use Topics  . Smoking status: Current Every Day Smoker    Packs/day: 0.50    Years: 6.00    Types: Cigarettes  . Smokeless tobacco: Never Used  . Alcohol use No     Comment: Quits 1 month ago.     Allergies   Aspartame and phenylalanine; Codeine; and Sulfa antibiotics   Review of Systems Review of Systems  Constitutional: Negative for fever.  Musculoskeletal: Negative for arthralgias and myalgias.  Skin: Positive for rash.  Neurological: Negative for weakness and numbness.     Physical Exam Updated Vital Signs BP 126/88 (BP Location: Left Arm)   Pulse 73   Temp 98.4 F (36.9 C) (Oral)   Ht 5\' 3"  (1.6 m)   Wt 56.2 kg   SpO2 100%   BMI 21.97 kg/m   Physical Exam  Constitutional: She is oriented to person, place, and time. She appears well-developed and well-nourished. No distress.  HENT:  Head: Normocephalic and atraumatic.  Neck: Neck supple.  Cardiovascular: Normal rate, regular rhythm and normal heart sounds.   2+ radial pulse  Pulmonary/Chest: Effort normal and breath sounds normal. No respiratory distress.  Musculoskeletal:  RUE with full ROM without pain and 5/5 muscle strength.     Neurological: She is alert and oriented to person, place, and time.  Skin: Skin is warm and dry.  Base of right thumb with 2cm circular pruritic erythematous maculopapular rash. No surrounding erythema or streaking. No tenderness to the touch.   Nursing note and vitals reviewed.    ED Treatments / Results  Labs (all labs ordered are listed, but only abnormal results are displayed) Labs Reviewed - No data to display  EKG  EKG Interpretation None       Radiology No results found.  Procedures Procedures (including critical care time)  Medications Ordered in ED Medications - No data to display   Initial Impression / Assessment and Plan / ED Course  I have reviewed the triage vital signs and the nursing notes.  Pertinent labs & imaging results that were available during my care of the patient were reviewed by me and considered in my medical decision making (see chart for details).  Clinical Course   Verl BangsCheryl M Wlodarczyk is a 55 y.o. female who presents to ED for maculopapular circular rash to base of right thumb. Patient denies any difficulty breathing or swallowing.  No blisters or pustules. No concern for superimposed infection. No concern for SJS, TEN, TSS, tick borne illness, syphilis or other life-threatening condition. Will discharge home with Lotrisone cream. PCP follow up if no improvement.   Final Clinical Impressions(s) / ED Diagnoses   Final diagnoses:  Rash    New Prescriptions New Prescriptions   CLOTRIMAZOLE-BETAMETHASONE (LOTRISONE) CREAM    Apply to affected area 2 times daily prn     [facility] CenterJaime Pilcher Takyla Kuchera, PA-C 07/06/16 1055    Shaune Pollackameron Isaacs, MD 07/07/16 [phone]

## 2016-07-21 DIAGNOSIS — E162 Hypoglycemia, unspecified: Secondary | ICD-10-CM | POA: Diagnosis not present

## 2016-07-21 DIAGNOSIS — R7303 Prediabetes: Secondary | ICD-10-CM | POA: Diagnosis not present

## 2016-07-29 DIAGNOSIS — R7303 Prediabetes: Secondary | ICD-10-CM | POA: Diagnosis not present

## 2016-07-29 DIAGNOSIS — E162 Hypoglycemia, unspecified: Secondary | ICD-10-CM | POA: Diagnosis not present

## 2016-08-18 DIAGNOSIS — M47814 Spondylosis without myelopathy or radiculopathy, thoracic region: Secondary | ICD-10-CM | POA: Diagnosis not present

## 2016-08-18 DIAGNOSIS — M47819 Spondylosis without myelopathy or radiculopathy, site unspecified: Secondary | ICD-10-CM | POA: Diagnosis not present

## 2016-08-18 DIAGNOSIS — M25562 Pain in left knee: Secondary | ICD-10-CM | POA: Diagnosis not present

## 2016-08-18 DIAGNOSIS — M545 Low back pain: Secondary | ICD-10-CM | POA: Diagnosis not present

## 2016-08-18 DIAGNOSIS — S3210XS Unspecified fracture of sacrum, sequela: Secondary | ICD-10-CM | POA: Diagnosis not present

## 2016-08-18 DIAGNOSIS — M47816 Spondylosis without myelopathy or radiculopathy, lumbar region: Secondary | ICD-10-CM | POA: Diagnosis not present

## 2016-08-24 DIAGNOSIS — Z23 Encounter for immunization: Secondary | ICD-10-CM | POA: Diagnosis not present

## 2016-08-26 DIAGNOSIS — S3210XS Unspecified fracture of sacrum, sequela: Secondary | ICD-10-CM | POA: Diagnosis not present

## 2016-08-26 DIAGNOSIS — M47814 Spondylosis without myelopathy or radiculopathy, thoracic region: Secondary | ICD-10-CM | POA: Diagnosis not present

## 2016-08-26 DIAGNOSIS — M47816 Spondylosis without myelopathy or radiculopathy, lumbar region: Secondary | ICD-10-CM | POA: Diagnosis not present

## 2016-08-26 DIAGNOSIS — M25562 Pain in left knee: Secondary | ICD-10-CM | POA: Diagnosis not present

## 2016-08-31 DIAGNOSIS — E162 Hypoglycemia, unspecified: Secondary | ICD-10-CM | POA: Diagnosis not present

## 2016-08-31 DIAGNOSIS — R7303 Prediabetes: Secondary | ICD-10-CM | POA: Diagnosis not present

## 2016-09-06 DIAGNOSIS — R071 Chest pain on breathing: Secondary | ICD-10-CM | POA: Diagnosis not present

## 2016-09-06 DIAGNOSIS — M791 Myalgia: Secondary | ICD-10-CM | POA: Diagnosis not present

## 2016-09-06 DIAGNOSIS — F329 Major depressive disorder, single episode, unspecified: Secondary | ICD-10-CM | POA: Diagnosis not present

## 2016-09-06 DIAGNOSIS — R079 Chest pain, unspecified: Secondary | ICD-10-CM | POA: Diagnosis not present

## 2016-09-07 DIAGNOSIS — M545 Low back pain: Secondary | ICD-10-CM | POA: Diagnosis not present

## 2016-09-07 DIAGNOSIS — M47815 Spondylosis without myelopathy or radiculopathy, thoracolumbar region: Secondary | ICD-10-CM | POA: Diagnosis not present

## 2016-09-14 DIAGNOSIS — M47815 Spondylosis without myelopathy or radiculopathy, thoracolumbar region: Secondary | ICD-10-CM | POA: Diagnosis not present

## 2016-09-14 DIAGNOSIS — M545 Low back pain: Secondary | ICD-10-CM | POA: Diagnosis not present

## 2016-09-29 DIAGNOSIS — M545 Low back pain: Secondary | ICD-10-CM | POA: Diagnosis not present

## 2016-09-29 DIAGNOSIS — M47815 Spondylosis without myelopathy or radiculopathy, thoracolumbar region: Secondary | ICD-10-CM | POA: Diagnosis not present

## 2016-09-30 DIAGNOSIS — F339 Major depressive disorder, recurrent, unspecified: Secondary | ICD-10-CM | POA: Diagnosis not present

## 2016-10-08 DIAGNOSIS — M47815 Spondylosis without myelopathy or radiculopathy, thoracolumbar region: Secondary | ICD-10-CM | POA: Diagnosis not present

## 2016-10-08 DIAGNOSIS — M545 Low back pain: Secondary | ICD-10-CM | POA: Diagnosis not present

## 2016-10-22 DIAGNOSIS — M47815 Spondylosis without myelopathy or radiculopathy, thoracolumbar region: Secondary | ICD-10-CM | POA: Diagnosis not present

## 2016-10-22 DIAGNOSIS — M545 Low back pain: Secondary | ICD-10-CM | POA: Diagnosis not present

## 2016-11-04 DIAGNOSIS — Z87898 Personal history of other specified conditions: Secondary | ICD-10-CM | POA: Diagnosis not present

## 2016-11-04 DIAGNOSIS — F17200 Nicotine dependence, unspecified, uncomplicated: Secondary | ICD-10-CM | POA: Diagnosis not present

## 2016-11-04 DIAGNOSIS — R55 Syncope and collapse: Secondary | ICD-10-CM | POA: Diagnosis not present

## 2016-11-08 DIAGNOSIS — M545 Low back pain: Secondary | ICD-10-CM | POA: Diagnosis not present

## 2016-11-08 DIAGNOSIS — M47815 Spondylosis without myelopathy or radiculopathy, thoracolumbar region: Secondary | ICD-10-CM | POA: Diagnosis not present

## 2016-11-09 DIAGNOSIS — M25561 Pain in right knee: Secondary | ICD-10-CM | POA: Diagnosis not present

## 2016-11-09 DIAGNOSIS — M47816 Spondylosis without myelopathy or radiculopathy, lumbar region: Secondary | ICD-10-CM | POA: Diagnosis not present

## 2016-11-09 DIAGNOSIS — M47814 Spondylosis without myelopathy or radiculopathy, thoracic region: Secondary | ICD-10-CM | POA: Diagnosis not present

## 2016-11-10 DIAGNOSIS — R55 Syncope and collapse: Secondary | ICD-10-CM | POA: Diagnosis not present

## 2016-11-10 DIAGNOSIS — Z87898 Personal history of other specified conditions: Secondary | ICD-10-CM | POA: Diagnosis not present

## 2016-11-11 ENCOUNTER — Other Ambulatory Visit: Payer: Self-pay | Admitting: Obstetrics

## 2016-11-11 DIAGNOSIS — Z1231 Encounter for screening mammogram for malignant neoplasm of breast: Secondary | ICD-10-CM

## 2016-11-15 DIAGNOSIS — M545 Low back pain: Secondary | ICD-10-CM | POA: Diagnosis not present

## 2016-11-15 DIAGNOSIS — M47815 Spondylosis without myelopathy or radiculopathy, thoracolumbar region: Secondary | ICD-10-CM | POA: Diagnosis not present

## 2016-11-26 DIAGNOSIS — R55 Syncope and collapse: Secondary | ICD-10-CM | POA: Diagnosis not present

## 2016-11-30 DIAGNOSIS — F17200 Nicotine dependence, unspecified, uncomplicated: Secondary | ICD-10-CM | POA: Diagnosis not present

## 2016-11-30 DIAGNOSIS — R55 Syncope and collapse: Secondary | ICD-10-CM | POA: Diagnosis not present

## 2016-11-30 DIAGNOSIS — Z87898 Personal history of other specified conditions: Secondary | ICD-10-CM | POA: Diagnosis not present

## 2016-12-03 ENCOUNTER — Ambulatory Visit (INDEPENDENT_AMBULATORY_CARE_PROVIDER_SITE_OTHER): Payer: BLUE CROSS/BLUE SHIELD | Admitting: Obstetrics

## 2016-12-03 ENCOUNTER — Encounter: Payer: Self-pay | Admitting: Obstetrics

## 2016-12-03 VITALS — BP 136/85 | HR 76 | Ht 63.0 in | Wt 122.0 lb

## 2016-12-03 DIAGNOSIS — Z0000 Encounter for general adult medical examination without abnormal findings: Secondary | ICD-10-CM

## 2016-12-03 DIAGNOSIS — Z01419 Encounter for gynecological examination (general) (routine) without abnormal findings: Secondary | ICD-10-CM

## 2016-12-03 DIAGNOSIS — Z1211 Encounter for screening for malignant neoplasm of colon: Secondary | ICD-10-CM

## 2016-12-03 DIAGNOSIS — Z1212 Encounter for screening for malignant neoplasm of rectum: Secondary | ICD-10-CM

## 2016-12-03 DIAGNOSIS — Z124 Encounter for screening for malignant neoplasm of cervix: Secondary | ICD-10-CM

## 2016-12-03 DIAGNOSIS — Z113 Encounter for screening for infections with a predominantly sexual mode of transmission: Secondary | ICD-10-CM

## 2016-12-03 NOTE — Progress Notes (Signed)
Subjective:        Felicia Wilcox is a 56 y.o. female here for a routine exam.  Current complaints: Chronic BV.  Libido is up and down.  Any products to increase Libido.    Personal health questionnaire:  Is patient Ashkenazi Jewish, have a family history of breast and/or ovarian cancer: no Is there a family history of uterine cancer diagnosed at age < 4650, gastrointestinal cancer, urinary tract cancer, family member who is a Personnel officerLynch syndrome-associated carrier: no Is the patient overweight and hypertensive, family history of diabetes, personal history of gestational diabetes, preeclampsia or PCOS: no Is patient over 7455, have PCOS,  family history of premature CHD under age 58, diabetes, smoke, have hypertension or peripheral artery disease:  no At any time, has a partner hit, kicked or otherwise hurt or frightened you?: no Over the past 2 weeks, have you felt down, depressed or hopeless?: no Over the past 2 weeks, have you felt little interest or pleasure in doing things?:no   Gynecologic History No LMP recorded. Patient is postmenopausal. Contraception: post menopausal status Last Pap: 2015. Results were: normal Last mammogram: 2014. Results were: normal  Obstetric History OB History  Gravida Para Term Preterm AB Living  1         1  SAB TAB Ectopic Multiple Live Births          1    # Outcome Date GA Lbr Len/2nd Weight Sex Delivery Anes PTL Lv  1 Gravida 07/05/91    M CS-LTranv EPI  LIV      Past Medical History:  Diagnosis Date  . Anxiety   . Arthritis    2 herniated disc lower back  . Chronic lower back pain   . GERD (gastroesophageal reflux disease)   . Sleep apnea    mild - does not use CPAP machine  . Smoker     Past Surgical History:  Procedure Laterality Date  . Abalation     . BACK SURGERY     x 3 - branch block lower back injections -block nerve endings  . BARTHOLIN GLAND CYST EXCISION    . CESAREAN SECTION    . HYSTEROSCOPY N/A 09/13/2014   Procedure: HYSTEROSCOPY / REMOVAL OF INTRAUTERINE DEVICE;  Surgeon: Antionette CharLisa Jackson-Moore, MD;  Location: [facility];  Service: Gynecology;  Laterality: N/A;  IUD removal  . NECK SURGERY     ? C3-4-5 with rods  . TONSILLECTOMY    . WISDOM TOOTH EXTRACTION       Current Outpatient Prescriptions:  .  amitriptyline (ELAVIL) 10 MG tablet, , Disp: , Rfl:  .  BLACK COHOSH PO, Take 1 tablet by mouth daily., Disp: , Rfl:  .  buPROPion (WELLBUTRIN XL) 150 MG 24 hr tablet, Take 150 mg by mouth 2 (two) times daily., Disp: , Rfl:  .  clotrimazole-betamethasone (LOTRISONE) cream, Apply to affected area 2 times daily prn (Patient not taking: Reported on 12/03/2016), Disp: 15 g, Rfl: 0 .  Multiple Vitamin (MULTIVITAMIN WITH MINERALS) TABS tablet, Take 1 tablet by mouth daily., Disp: , Rfl:  .  Zinc Acetate, Oral, (ZINC ACETATE PO), Take 1 tablet by mouth daily., Disp: , Rfl:  Allergies  Allergen Reactions  . Aspartame And Phenylalanine Anaphylaxis  . Codeine     "Bleed internally"   . Sulfa Antibiotics Nausea And Vomiting    Social History  Substance Use Topics  . Smoking status: Current Every Day Smoker    Packs/day: 0.50  Years: 6.00    Types: Cigarettes  . Smokeless tobacco: Never Used  . Alcohol use No     Comment: Quits 1 month ago.    Family History  Problem Relation Age of Onset  . Heart disease Mother   . Cancer Father   . Diabetes Maternal Grandmother   . Stroke Sister   . Heart disease Sister       Review of Systems  Constitutional: negative for fatigue and weight loss Respiratory: negative for cough and wheezing Cardiovascular: negative for chest pain, fatigue and palpitations Gastrointestinal: negative for abdominal pain and change in bowel habits Musculoskeletal:negative for myalgias Neurological: negative for gait problems and tremors Behavioral/Psych: negative for abusive relationship, depression Endocrine: negative for temperature intolerance    Genitourinary:positie for  decreased Libido and chronic BV Integument/breast: negative for breast lump, breast tenderness, nipple discharge and skin lesion(s)    Objective:       BP 136/85   Pulse 76   Ht 5\' 3"  (1.6 m)   Wt 122 lb (55.3 kg)   BMI 21.61 kg/m  General:   alert  Skin:   no rash or abnormalities  Lungs:   clear to auscultation bilaterally  Heart:   regular rate and rhythm, S1, S2 normal, no murmur, click, rub or gallop  Breasts:   normal without suspicious masses, skin or nipple changes or axillary nodes  Abdomen:  normal findings: no organomegaly, soft, non-tender and no hernia  Pelvis:  External genitalia: normal general appearance Urinary system: urethral meatus normal and bladder without fullness, nontender Vaginal: normal without tenderness, induration or masses Cervix: normal appearance Adnexa: normal bimanual exam Uterus: anteverted and non-tender, normal size   Lab Review Urine pregnancy test Labs reviewed yes Radiologic studies reviewed yes  50% of 20 min visit spent on counseling and coordination of care.    Assessment:    Healthy female exam.    Postmenopausal changes  Screening for Colon CA  Plan:    Recommended consultation with Herbalist for natural products to enhance libido  Referred to GI for Colonoscopy   Education reviewed: calcium supplements, depression evaluation, self breast exams, skin cancer screening, smoking cessation and weight bearing exercise. Mammogram ordered. Follow up in: 2 years.   Meds ordered this encounter  Medications  . amitriptyline (ELAVIL) 10 MG tablet   No orders of the defined types were placed in this encounter.    Pt presents for annual, pap, and wet prep. Pt c/o reoccurring BV worst during summer time. Pt needs referral to colonoscopy. Mammogram scheduled 12/16/16.Patient ID: Felicia Wilcox, female   DOB: 1961-03-29, 56 y.o.   MRN: [id number]

## 2016-12-05 ENCOUNTER — Encounter: Payer: Self-pay | Admitting: Obstetrics

## 2016-12-06 LAB — CERVICOVAGINAL ANCILLARY ONLY
Bacterial vaginitis: NEGATIVE
Candida vaginitis: NEGATIVE
Chlamydia: NEGATIVE
Neisseria Gonorrhea: NEGATIVE
Trichomonas: NEGATIVE

## 2016-12-07 LAB — CYTOLOGY - PAP
Diagnosis: NEGATIVE
HPV: NOT DETECTED

## 2016-12-16 ENCOUNTER — Ambulatory Visit
Admission: RE | Admit: 2016-12-16 | Discharge: 2016-12-16 | Disposition: A | Discharge status: Home / Self Care | Payer: BLUE CROSS/BLUE SHIELD | Source: Ambulatory Visit | Attending: Obstetrics | Admitting: Obstetrics

## 2016-12-16 DIAGNOSIS — Z1231 Encounter for screening mammogram for malignant neoplasm of breast: Secondary | ICD-10-CM | POA: Diagnosis not present

## 2016-12-17 ENCOUNTER — Other Ambulatory Visit: Payer: Self-pay | Admitting: Obstetrics

## 2016-12-17 DIAGNOSIS — R928 Other abnormal and inconclusive findings on diagnostic imaging of breast: Secondary | ICD-10-CM

## 2016-12-29 ENCOUNTER — Other Ambulatory Visit: Payer: BLUE CROSS/BLUE SHIELD

## 2016-12-30 ENCOUNTER — Telehealth: Payer: Self-pay | Admitting: *Deleted

## 2016-12-30 NOTE — Telephone Encounter (Signed)
Pt called in to office for lab results. Results given and pt has no other concerns.

## 2016-12-31 ENCOUNTER — Ambulatory Visit
Admission: RE | Admit: 2016-12-31 | Discharge: 2016-12-31 | Disposition: A | Discharge status: Home / Self Care | Payer: BLUE CROSS/BLUE SHIELD | Source: Ambulatory Visit | Attending: Obstetrics | Admitting: Obstetrics

## 2016-12-31 DIAGNOSIS — R928 Other abnormal and inconclusive findings on diagnostic imaging of breast: Secondary | ICD-10-CM

## 2016-12-31 DIAGNOSIS — R922 Inconclusive mammogram: Secondary | ICD-10-CM | POA: Diagnosis not present

## 2016-12-31 DIAGNOSIS — N6002 Solitary cyst of left breast: Secondary | ICD-10-CM | POA: Diagnosis not present

## 2017-01-05 ENCOUNTER — Telehealth: Payer: Self-pay | Admitting: *Deleted

## 2017-01-05 NOTE — Telephone Encounter (Signed)
Pt called to office stating that she tried an at home vinegar water douche as recommended by provider.  Pt states she has also tried OTC yeast treatment. Pt states that she may have had solution to acidic and is now having some vaginal burning and discomfort. Reviewed with Dr Clearance Coots, he recommends pt give time for vaginal area to repair. If pt is having yeast symptoms, may treat with Diflucan.  Attempt to call pt to discuss. LM on VM to call office.

## 2017-01-06 ENCOUNTER — Telehealth: Payer: Self-pay | Admitting: *Deleted

## 2017-01-06 NOTE — Telephone Encounter (Signed)
Spoke with pt regarding previous call. Pt was advised to not use vaginal cream, not introduce anything into vagina for the next several days to promote healing. Pt advised she may get Diflucan Rx today if needed, pt declines. Pt states that she finished OTC yeast treatment despite painful/irritated feeling. Pt made aware that if she is still having problems with pain/burning in vaginal area next week she may want to call back and make appt for follow up with Dr Clearance Coots.  Pt also inquires about STD screening.  Pt made aware of test that were done at last visit and had negative results.  Pt advised that if she would like blood work for STD panel she may want to contact her insurance for coverage; pt had questions about coverage. Pt advised if she has coverage for labwork she could contact office in order to schedule.  Pt states understanding.

## 2017-01-10 ENCOUNTER — Other Ambulatory Visit: Payer: Self-pay

## 2017-01-10 MED ORDER — FLUCONAZOLE 150 MG PO TABS: 150.0000 mg | ORAL_TABLET | Freq: Once | ORAL | 1 refills | Status: AC

## 2017-01-10 NOTE — Progress Notes (Signed)
TC from pt states Diflucan was previously offered but she declined. She has changed her mind and now wants the rx d/t vaginal irritation.

## 2017-01-18 ENCOUNTER — Telehealth: Payer: Self-pay | Admitting: *Deleted

## 2017-01-18 NOTE — Telephone Encounter (Signed)
Patient used the Diflucan and she still had symptoms of yeast - so she used OTC Monistat it burned her so bad that she had to decrease the dose to a lighter cream. She is still not better and she is burning on the labia. She is going to do a  refill of the Diflucan and see if that helps. Is there anything she can do comfort for the outside burning?

## 2017-01-25 DIAGNOSIS — F430 Acute stress reaction: Secondary | ICD-10-CM | POA: Diagnosis not present

## 2017-01-25 DIAGNOSIS — F411 Generalized anxiety disorder: Secondary | ICD-10-CM | POA: Diagnosis not present

## 2017-01-25 DIAGNOSIS — I6782 Cerebral ischemia: Secondary | ICD-10-CM | POA: Diagnosis not present

## 2017-01-25 DIAGNOSIS — R531 Weakness: Secondary | ICD-10-CM | POA: Diagnosis not present

## 2017-01-25 DIAGNOSIS — R202 Paresthesia of skin: Secondary | ICD-10-CM | POA: Diagnosis not present

## 2017-01-25 DIAGNOSIS — R200 Anesthesia of skin: Secondary | ICD-10-CM | POA: Diagnosis not present

## 2017-01-26 ENCOUNTER — Other Ambulatory Visit (INDEPENDENT_AMBULATORY_CARE_PROVIDER_SITE_OTHER): Payer: BLUE CROSS/BLUE SHIELD

## 2017-01-26 ENCOUNTER — Other Ambulatory Visit: Payer: Self-pay | Admitting: Obstetrics

## 2017-01-26 DIAGNOSIS — Z113 Encounter for screening for infections with a predominantly sexual mode of transmission: Secondary | ICD-10-CM | POA: Diagnosis not present

## 2017-01-26 DIAGNOSIS — R030 Elevated blood-pressure reading, without diagnosis of hypertension: Secondary | ICD-10-CM | POA: Diagnosis not present

## 2017-01-26 DIAGNOSIS — F17200 Nicotine dependence, unspecified, uncomplicated: Secondary | ICD-10-CM | POA: Diagnosis not present

## 2017-01-26 DIAGNOSIS — R591 Generalized enlarged lymph nodes: Secondary | ICD-10-CM | POA: Diagnosis not present

## 2017-01-26 DIAGNOSIS — R51 Headache: Secondary | ICD-10-CM | POA: Diagnosis not present

## 2017-01-26 DIAGNOSIS — J387 Other diseases of larynx: Secondary | ICD-10-CM | POA: Diagnosis not present

## 2017-01-26 DIAGNOSIS — R202 Paresthesia of skin: Secondary | ICD-10-CM | POA: Diagnosis not present

## 2017-01-26 DIAGNOSIS — N898 Other specified noninflammatory disorders of vagina: Secondary | ICD-10-CM

## 2017-01-27 ENCOUNTER — Encounter: Payer: Self-pay | Admitting: Obstetrics

## 2017-01-27 LAB — CERVICOVAGINAL ANCILLARY ONLY
Bacterial vaginitis: NEGATIVE
Candida vaginitis: NEGATIVE
Chlamydia: NEGATIVE
Neisseria Gonorrhea: NEGATIVE
Trichomonas: NEGATIVE

## 2017-02-01 ENCOUNTER — Telehealth: Payer: Self-pay | Admitting: *Deleted

## 2017-02-01 NOTE — Telephone Encounter (Signed)
Pt called to office for lab results.  Attempt to contact pt.  No answer, no VM.

## 2017-02-01 NOTE — Telephone Encounter (Signed)
Pt calling for lab results. Results reviewed with pt. Pt has no other concerns.

## 2017-02-16 DIAGNOSIS — R591 Generalized enlarged lymph nodes: Secondary | ICD-10-CM | POA: Diagnosis not present

## 2017-02-16 DIAGNOSIS — Z6822 Body mass index (BMI) 22.0-22.9, adult: Secondary | ICD-10-CM | POA: Diagnosis not present

## 2017-02-16 DIAGNOSIS — J387 Other diseases of larynx: Secondary | ICD-10-CM | POA: Diagnosis not present

## 2017-02-16 DIAGNOSIS — F17200 Nicotine dependence, unspecified, uncomplicated: Secondary | ICD-10-CM | POA: Diagnosis not present

## 2017-02-16 DIAGNOSIS — I6782 Cerebral ischemia: Secondary | ICD-10-CM | POA: Diagnosis not present

## 2017-02-18 DIAGNOSIS — R300 Dysuria: Secondary | ICD-10-CM | POA: Diagnosis not present

## 2017-02-23 DIAGNOSIS — R9082 White matter disease, unspecified: Secondary | ICD-10-CM | POA: Diagnosis not present

## 2017-02-23 DIAGNOSIS — Z0389 Encounter for observation for other suspected diseases and conditions ruled out: Secondary | ICD-10-CM | POA: Diagnosis not present

## 2017-02-23 DIAGNOSIS — R202 Paresthesia of skin: Secondary | ICD-10-CM | POA: Diagnosis not present

## 2017-02-23 DIAGNOSIS — E162 Hypoglycemia, unspecified: Secondary | ICD-10-CM | POA: Diagnosis not present

## 2017-02-23 DIAGNOSIS — R591 Generalized enlarged lymph nodes: Secondary | ICD-10-CM | POA: Diagnosis not present

## 2017-03-03 DIAGNOSIS — R9089 Other abnormal findings on diagnostic imaging of central nervous system: Secondary | ICD-10-CM | POA: Diagnosis not present

## 2017-03-03 DIAGNOSIS — R5383 Other fatigue: Secondary | ICD-10-CM | POA: Diagnosis not present

## 2017-03-03 DIAGNOSIS — R51 Headache: Secondary | ICD-10-CM | POA: Diagnosis not present

## 2017-03-03 DIAGNOSIS — N3000 Acute cystitis without hematuria: Secondary | ICD-10-CM | POA: Diagnosis not present

## 2017-03-03 DIAGNOSIS — E162 Hypoglycemia, unspecified: Secondary | ICD-10-CM | POA: Diagnosis not present

## 2017-03-04 DIAGNOSIS — E162 Hypoglycemia, unspecified: Secondary | ICD-10-CM | POA: Diagnosis not present

## 2017-03-15 DIAGNOSIS — Z658 Other specified problems related to psychosocial circumstances: Secondary | ICD-10-CM | POA: Diagnosis not present

## 2017-03-15 DIAGNOSIS — R51 Headache: Secondary | ICD-10-CM | POA: Diagnosis not present

## 2017-03-15 DIAGNOSIS — R200 Anesthesia of skin: Secondary | ICD-10-CM | POA: Diagnosis not present

## 2017-03-15 DIAGNOSIS — R531 Weakness: Secondary | ICD-10-CM | POA: Diagnosis not present

## 2017-04-06 DIAGNOSIS — F4323 Adjustment disorder with mixed anxiety and depressed mood: Secondary | ICD-10-CM | POA: Diagnosis not present

## 2017-04-13 DIAGNOSIS — M5412 Radiculopathy, cervical region: Secondary | ICD-10-CM | POA: Diagnosis not present

## 2017-04-13 DIAGNOSIS — F411 Generalized anxiety disorder: Secondary | ICD-10-CM | POA: Diagnosis not present

## 2017-04-13 DIAGNOSIS — F4323 Adjustment disorder with mixed anxiety and depressed mood: Secondary | ICD-10-CM | POA: Diagnosis not present

## 2017-04-13 DIAGNOSIS — G5611 Other lesions of median nerve, right upper limb: Secondary | ICD-10-CM | POA: Diagnosis not present

## 2017-04-13 DIAGNOSIS — G5601 Carpal tunnel syndrome, right upper limb: Secondary | ICD-10-CM | POA: Diagnosis not present

## 2017-04-20 DIAGNOSIS — M5412 Radiculopathy, cervical region: Secondary | ICD-10-CM | POA: Diagnosis not present

## 2017-04-20 DIAGNOSIS — M542 Cervicalgia: Secondary | ICD-10-CM | POA: Diagnosis not present

## 2017-04-20 DIAGNOSIS — M545 Low back pain: Secondary | ICD-10-CM | POA: Diagnosis not present

## 2017-04-20 DIAGNOSIS — F411 Generalized anxiety disorder: Secondary | ICD-10-CM | POA: Diagnosis not present

## 2017-04-20 DIAGNOSIS — F4323 Adjustment disorder with mixed anxiety and depressed mood: Secondary | ICD-10-CM | POA: Diagnosis not present

## 2017-04-27 DIAGNOSIS — M542 Cervicalgia: Secondary | ICD-10-CM | POA: Diagnosis not present

## 2017-04-27 DIAGNOSIS — M545 Low back pain: Secondary | ICD-10-CM | POA: Diagnosis not present

## 2017-04-27 DIAGNOSIS — M5412 Radiculopathy, cervical region: Secondary | ICD-10-CM | POA: Diagnosis not present

## 2017-04-28 DIAGNOSIS — F411 Generalized anxiety disorder: Secondary | ICD-10-CM | POA: Diagnosis not present

## 2017-04-28 DIAGNOSIS — F4323 Adjustment disorder with mixed anxiety and depressed mood: Secondary | ICD-10-CM | POA: Diagnosis not present

## 2017-05-03 DIAGNOSIS — R29898 Other symptoms and signs involving the musculoskeletal system: Secondary | ICD-10-CM | POA: Diagnosis not present

## 2017-05-04 DIAGNOSIS — L0100 Impetigo, unspecified: Secondary | ICD-10-CM | POA: Diagnosis not present

## 2017-05-04 DIAGNOSIS — J029 Acute pharyngitis, unspecified: Secondary | ICD-10-CM | POA: Diagnosis not present

## 2017-05-04 DIAGNOSIS — F4323 Adjustment disorder with mixed anxiety and depressed mood: Secondary | ICD-10-CM | POA: Diagnosis not present

## 2017-05-04 DIAGNOSIS — M542 Cervicalgia: Secondary | ICD-10-CM | POA: Diagnosis not present

## 2017-05-04 DIAGNOSIS — F411 Generalized anxiety disorder: Secondary | ICD-10-CM | POA: Diagnosis not present

## 2017-05-04 DIAGNOSIS — M545 Low back pain: Secondary | ICD-10-CM | POA: Diagnosis not present

## 2017-05-04 DIAGNOSIS — M5412 Radiculopathy, cervical region: Secondary | ICD-10-CM | POA: Diagnosis not present

## 2017-05-05 DIAGNOSIS — B028 Zoster with other complications: Secondary | ICD-10-CM | POA: Diagnosis not present

## 2017-05-05 DIAGNOSIS — H04123 Dry eye syndrome of bilateral lacrimal glands: Secondary | ICD-10-CM | POA: Diagnosis not present

## 2017-05-06 DIAGNOSIS — B029 Zoster without complications: Secondary | ICD-10-CM | POA: Diagnosis not present

## 2017-05-11 DIAGNOSIS — M542 Cervicalgia: Secondary | ICD-10-CM | POA: Diagnosis not present

## 2017-05-11 DIAGNOSIS — M5412 Radiculopathy, cervical region: Secondary | ICD-10-CM | POA: Diagnosis not present

## 2017-05-11 DIAGNOSIS — F411 Generalized anxiety disorder: Secondary | ICD-10-CM | POA: Diagnosis not present

## 2017-05-11 DIAGNOSIS — M545 Low back pain: Secondary | ICD-10-CM | POA: Diagnosis not present

## 2017-05-11 DIAGNOSIS — Z1211 Encounter for screening for malignant neoplasm of colon: Secondary | ICD-10-CM | POA: Diagnosis not present

## 2017-05-11 DIAGNOSIS — F4323 Adjustment disorder with mixed anxiety and depressed mood: Secondary | ICD-10-CM | POA: Diagnosis not present

## 2017-05-13 DIAGNOSIS — B029 Zoster without complications: Secondary | ICD-10-CM | POA: Diagnosis not present

## 2017-05-18 DIAGNOSIS — M5412 Radiculopathy, cervical region: Secondary | ICD-10-CM | POA: Diagnosis not present

## 2017-05-18 DIAGNOSIS — M542 Cervicalgia: Secondary | ICD-10-CM | POA: Diagnosis not present

## 2017-05-18 DIAGNOSIS — F411 Generalized anxiety disorder: Secondary | ICD-10-CM | POA: Diagnosis not present

## 2017-05-18 DIAGNOSIS — F4323 Adjustment disorder with mixed anxiety and depressed mood: Secondary | ICD-10-CM | POA: Diagnosis not present

## 2017-05-18 DIAGNOSIS — M545 Low back pain: Secondary | ICD-10-CM | POA: Diagnosis not present

## 2017-05-25 DIAGNOSIS — F4323 Adjustment disorder with mixed anxiety and depressed mood: Secondary | ICD-10-CM | POA: Diagnosis not present

## 2017-05-25 DIAGNOSIS — M545 Low back pain: Secondary | ICD-10-CM | POA: Diagnosis not present

## 2017-05-25 DIAGNOSIS — M5412 Radiculopathy, cervical region: Secondary | ICD-10-CM | POA: Diagnosis not present

## 2017-05-25 DIAGNOSIS — F411 Generalized anxiety disorder: Secondary | ICD-10-CM | POA: Diagnosis not present

## 2017-05-25 DIAGNOSIS — M542 Cervicalgia: Secondary | ICD-10-CM | POA: Diagnosis not present

## 2017-06-01 DIAGNOSIS — F4323 Adjustment disorder with mixed anxiety and depressed mood: Secondary | ICD-10-CM | POA: Diagnosis not present

## 2017-06-01 DIAGNOSIS — M545 Low back pain: Secondary | ICD-10-CM | POA: Diagnosis not present

## 2017-06-01 DIAGNOSIS — M5412 Radiculopathy, cervical region: Secondary | ICD-10-CM | POA: Diagnosis not present

## 2017-06-01 DIAGNOSIS — M542 Cervicalgia: Secondary | ICD-10-CM | POA: Diagnosis not present

## 2017-06-01 DIAGNOSIS — F411 Generalized anxiety disorder: Secondary | ICD-10-CM | POA: Diagnosis not present

## 2017-06-08 DIAGNOSIS — Z01818 Encounter for other preprocedural examination: Secondary | ICD-10-CM | POA: Diagnosis not present

## 2017-06-08 DIAGNOSIS — Z1211 Encounter for screening for malignant neoplasm of colon: Secondary | ICD-10-CM | POA: Diagnosis not present

## 2017-06-08 DIAGNOSIS — K635 Polyp of colon: Secondary | ICD-10-CM | POA: Diagnosis not present

## 2017-06-12 DIAGNOSIS — K635 Polyp of colon: Secondary | ICD-10-CM | POA: Diagnosis not present

## 2017-06-14 DIAGNOSIS — B029 Zoster without complications: Secondary | ICD-10-CM | POA: Diagnosis not present

## 2017-06-14 DIAGNOSIS — K130 Diseases of lips: Secondary | ICD-10-CM | POA: Diagnosis not present

## 2017-06-14 DIAGNOSIS — L659 Nonscarring hair loss, unspecified: Secondary | ICD-10-CM | POA: Diagnosis not present

## 2017-06-15 DIAGNOSIS — M545 Low back pain: Secondary | ICD-10-CM | POA: Diagnosis not present

## 2017-06-15 DIAGNOSIS — M5412 Radiculopathy, cervical region: Secondary | ICD-10-CM | POA: Diagnosis not present

## 2017-06-15 DIAGNOSIS — F4323 Adjustment disorder with mixed anxiety and depressed mood: Secondary | ICD-10-CM | POA: Diagnosis not present

## 2017-06-15 DIAGNOSIS — M542 Cervicalgia: Secondary | ICD-10-CM | POA: Diagnosis not present

## 2017-06-16 DIAGNOSIS — R002 Palpitations: Secondary | ICD-10-CM | POA: Diagnosis not present

## 2017-06-16 DIAGNOSIS — R42 Dizziness and giddiness: Secondary | ICD-10-CM | POA: Diagnosis not present

## 2017-06-16 DIAGNOSIS — R110 Nausea: Secondary | ICD-10-CM | POA: Diagnosis not present

## 2017-06-16 DIAGNOSIS — R61 Generalized hyperhidrosis: Secondary | ICD-10-CM | POA: Diagnosis not present

## 2017-06-22 DIAGNOSIS — F4323 Adjustment disorder with mixed anxiety and depressed mood: Secondary | ICD-10-CM | POA: Diagnosis not present

## 2017-06-22 DIAGNOSIS — Z1211 Encounter for screening for malignant neoplasm of colon: Secondary | ICD-10-CM | POA: Diagnosis not present

## 2017-06-22 DIAGNOSIS — Z8719 Personal history of other diseases of the digestive system: Secondary | ICD-10-CM | POA: Diagnosis not present

## 2017-06-27 DIAGNOSIS — M79644 Pain in right finger(s): Secondary | ICD-10-CM | POA: Diagnosis not present

## 2017-06-27 DIAGNOSIS — M542 Cervicalgia: Secondary | ICD-10-CM | POA: Diagnosis not present

## 2017-06-27 DIAGNOSIS — R200 Anesthesia of skin: Secondary | ICD-10-CM | POA: Diagnosis not present

## 2017-06-27 DIAGNOSIS — M79645 Pain in left finger(s): Secondary | ICD-10-CM | POA: Diagnosis not present

## 2017-06-27 DIAGNOSIS — R42 Dizziness and giddiness: Secondary | ICD-10-CM | POA: Diagnosis not present

## 2017-07-06 DIAGNOSIS — M542 Cervicalgia: Secondary | ICD-10-CM | POA: Diagnosis not present

## 2017-07-06 DIAGNOSIS — F4323 Adjustment disorder with mixed anxiety and depressed mood: Secondary | ICD-10-CM | POA: Diagnosis not present

## 2017-07-06 DIAGNOSIS — M5412 Radiculopathy, cervical region: Secondary | ICD-10-CM | POA: Diagnosis not present

## 2017-07-06 DIAGNOSIS — M545 Low back pain: Secondary | ICD-10-CM | POA: Diagnosis not present

## 2017-07-13 DIAGNOSIS — F4323 Adjustment disorder with mixed anxiety and depressed mood: Secondary | ICD-10-CM | POA: Diagnosis not present

## 2017-07-20 DIAGNOSIS — Z0000 Encounter for general adult medical examination without abnormal findings: Secondary | ICD-10-CM | POA: Diagnosis not present

## 2017-07-20 DIAGNOSIS — R739 Hyperglycemia, unspecified: Secondary | ICD-10-CM | POA: Diagnosis not present

## 2017-07-20 DIAGNOSIS — M549 Dorsalgia, unspecified: Secondary | ICD-10-CM | POA: Diagnosis not present

## 2017-07-20 DIAGNOSIS — F4323 Adjustment disorder with mixed anxiety and depressed mood: Secondary | ICD-10-CM | POA: Diagnosis not present

## 2017-07-25 DIAGNOSIS — Z0000 Encounter for general adult medical examination without abnormal findings: Secondary | ICD-10-CM | POA: Diagnosis not present

## 2017-07-27 DIAGNOSIS — M5412 Radiculopathy, cervical region: Secondary | ICD-10-CM | POA: Diagnosis not present

## 2017-07-27 DIAGNOSIS — M542 Cervicalgia: Secondary | ICD-10-CM | POA: Diagnosis not present

## 2017-07-27 DIAGNOSIS — F4323 Adjustment disorder with mixed anxiety and depressed mood: Secondary | ICD-10-CM | POA: Diagnosis not present

## 2017-07-27 DIAGNOSIS — M545 Low back pain: Secondary | ICD-10-CM | POA: Diagnosis not present

## 2017-08-01 DIAGNOSIS — F4323 Adjustment disorder with mixed anxiety and depressed mood: Secondary | ICD-10-CM | POA: Diagnosis not present

## 2017-08-08 DIAGNOSIS — R200 Anesthesia of skin: Secondary | ICD-10-CM | POA: Diagnosis not present

## 2017-08-08 DIAGNOSIS — M545 Low back pain: Secondary | ICD-10-CM | POA: Diagnosis not present

## 2017-08-11 DIAGNOSIS — M542 Cervicalgia: Secondary | ICD-10-CM | POA: Diagnosis not present

## 2017-08-11 DIAGNOSIS — M545 Low back pain: Secondary | ICD-10-CM | POA: Diagnosis not present

## 2017-08-11 DIAGNOSIS — M5412 Radiculopathy, cervical region: Secondary | ICD-10-CM | POA: Diagnosis not present

## 2017-08-12 DIAGNOSIS — D485 Neoplasm of uncertain behavior of skin: Secondary | ICD-10-CM | POA: Diagnosis not present

## 2017-08-12 DIAGNOSIS — L739 Follicular disorder, unspecified: Secondary | ICD-10-CM | POA: Diagnosis not present

## 2017-08-12 DIAGNOSIS — L650 Telogen effluvium: Secondary | ICD-10-CM | POA: Diagnosis not present

## 2017-08-12 DIAGNOSIS — L309 Dermatitis, unspecified: Secondary | ICD-10-CM | POA: Diagnosis not present

## 2017-08-22 DIAGNOSIS — J4521 Mild intermittent asthma with (acute) exacerbation: Secondary | ICD-10-CM | POA: Diagnosis not present

## 2017-08-24 DIAGNOSIS — F4323 Adjustment disorder with mixed anxiety and depressed mood: Secondary | ICD-10-CM | POA: Diagnosis not present

## 2017-08-24 DIAGNOSIS — M545 Low back pain: Secondary | ICD-10-CM | POA: Diagnosis not present

## 2017-08-24 DIAGNOSIS — M5412 Radiculopathy, cervical region: Secondary | ICD-10-CM | POA: Diagnosis not present

## 2017-08-24 DIAGNOSIS — M542 Cervicalgia: Secondary | ICD-10-CM | POA: Diagnosis not present

## 2017-08-31 DIAGNOSIS — F4323 Adjustment disorder with mixed anxiety and depressed mood: Secondary | ICD-10-CM | POA: Diagnosis not present

## 2017-08-31 DIAGNOSIS — B354 Tinea corporis: Secondary | ICD-10-CM | POA: Diagnosis not present

## 2017-09-06 DIAGNOSIS — R55 Syncope and collapse: Secondary | ICD-10-CM | POA: Diagnosis not present

## 2017-09-06 DIAGNOSIS — F17200 Nicotine dependence, unspecified, uncomplicated: Secondary | ICD-10-CM | POA: Diagnosis not present

## 2017-09-06 DIAGNOSIS — Z87898 Personal history of other specified conditions: Secondary | ICD-10-CM | POA: Diagnosis not present

## 2017-09-07 DIAGNOSIS — F4323 Adjustment disorder with mixed anxiety and depressed mood: Secondary | ICD-10-CM | POA: Diagnosis not present

## 2017-09-14 DIAGNOSIS — F4323 Adjustment disorder with mixed anxiety and depressed mood: Secondary | ICD-10-CM | POA: Diagnosis not present

## 2017-09-28 DIAGNOSIS — F4323 Adjustment disorder with mixed anxiety and depressed mood: Secondary | ICD-10-CM | POA: Diagnosis not present

## 2017-10-05 DIAGNOSIS — F4323 Adjustment disorder with mixed anxiety and depressed mood: Secondary | ICD-10-CM | POA: Diagnosis not present

## 2017-10-12 DIAGNOSIS — F4323 Adjustment disorder with mixed anxiety and depressed mood: Secondary | ICD-10-CM | POA: Diagnosis not present

## 2017-10-19 DIAGNOSIS — F4323 Adjustment disorder with mixed anxiety and depressed mood: Secondary | ICD-10-CM | POA: Diagnosis not present

## 2017-11-09 DIAGNOSIS — M545 Low back pain: Secondary | ICD-10-CM | POA: Diagnosis not present

## 2017-11-09 DIAGNOSIS — M25561 Pain in right knee: Secondary | ICD-10-CM | POA: Diagnosis not present

## 2017-11-09 DIAGNOSIS — S335XXD Sprain of ligaments of lumbar spine, subsequent encounter: Secondary | ICD-10-CM | POA: Diagnosis not present

## 2017-11-09 DIAGNOSIS — M25552 Pain in left hip: Secondary | ICD-10-CM | POA: Diagnosis not present

## 2017-11-16 DIAGNOSIS — S335XXD Sprain of ligaments of lumbar spine, subsequent encounter: Secondary | ICD-10-CM | POA: Diagnosis not present

## 2017-11-16 DIAGNOSIS — M25561 Pain in right knee: Secondary | ICD-10-CM | POA: Diagnosis not present

## 2017-11-16 DIAGNOSIS — M545 Low back pain: Secondary | ICD-10-CM | POA: Diagnosis not present

## 2017-11-16 DIAGNOSIS — F4323 Adjustment disorder with mixed anxiety and depressed mood: Secondary | ICD-10-CM | POA: Diagnosis not present

## 2017-11-16 DIAGNOSIS — M25552 Pain in left hip: Secondary | ICD-10-CM | POA: Diagnosis not present

## 2017-11-21 DIAGNOSIS — F4323 Adjustment disorder with mixed anxiety and depressed mood: Secondary | ICD-10-CM | POA: Diagnosis not present

## 2017-11-22 DIAGNOSIS — M545 Low back pain: Secondary | ICD-10-CM | POA: Diagnosis not present

## 2017-11-22 DIAGNOSIS — S335XXD Sprain of ligaments of lumbar spine, subsequent encounter: Secondary | ICD-10-CM | POA: Diagnosis not present

## 2017-11-22 DIAGNOSIS — M25552 Pain in left hip: Secondary | ICD-10-CM | POA: Diagnosis not present

## 2017-11-22 DIAGNOSIS — M25561 Pain in right knee: Secondary | ICD-10-CM | POA: Diagnosis not present

## 2017-11-25 DIAGNOSIS — M47816 Spondylosis without myelopathy or radiculopathy, lumbar region: Secondary | ICD-10-CM | POA: Diagnosis not present

## 2017-11-25 DIAGNOSIS — G894 Chronic pain syndrome: Secondary | ICD-10-CM | POA: Diagnosis not present

## 2017-11-25 DIAGNOSIS — M48062 Spinal stenosis, lumbar region with neurogenic claudication: Secondary | ICD-10-CM | POA: Diagnosis not present

## 2017-11-25 DIAGNOSIS — M545 Low back pain: Secondary | ICD-10-CM | POA: Diagnosis not present

## 2017-11-25 DIAGNOSIS — M533 Sacrococcygeal disorders, not elsewhere classified: Secondary | ICD-10-CM | POA: Diagnosis not present

## 2017-11-28 ENCOUNTER — Other Ambulatory Visit: Payer: Self-pay | Admitting: Obstetrics

## 2017-11-28 DIAGNOSIS — N952 Postmenopausal atrophic vaginitis: Secondary | ICD-10-CM

## 2017-11-28 MED ORDER — MEDROXYPROGESTERONE ACETATE 5 MG PO TABS: ORAL_TABLET | ORAL | 6 refills | Status: DC

## 2017-11-29 DIAGNOSIS — S335XXD Sprain of ligaments of lumbar spine, subsequent encounter: Secondary | ICD-10-CM | POA: Diagnosis not present

## 2017-11-29 DIAGNOSIS — M545 Low back pain: Secondary | ICD-10-CM | POA: Diagnosis not present

## 2017-11-29 DIAGNOSIS — M25552 Pain in left hip: Secondary | ICD-10-CM | POA: Diagnosis not present

## 2017-11-29 DIAGNOSIS — M25561 Pain in right knee: Secondary | ICD-10-CM | POA: Diagnosis not present

## 2017-11-30 DIAGNOSIS — L659 Nonscarring hair loss, unspecified: Secondary | ICD-10-CM | POA: Diagnosis not present

## 2017-11-30 DIAGNOSIS — F411 Generalized anxiety disorder: Secondary | ICD-10-CM | POA: Diagnosis not present

## 2017-11-30 DIAGNOSIS — F4323 Adjustment disorder with mixed anxiety and depressed mood: Secondary | ICD-10-CM | POA: Diagnosis not present

## 2017-12-05 DIAGNOSIS — F4323 Adjustment disorder with mixed anxiety and depressed mood: Secondary | ICD-10-CM | POA: Diagnosis not present

## 2017-12-06 DIAGNOSIS — M25552 Pain in left hip: Secondary | ICD-10-CM | POA: Diagnosis not present

## 2017-12-06 DIAGNOSIS — S335XXD Sprain of ligaments of lumbar spine, subsequent encounter: Secondary | ICD-10-CM | POA: Diagnosis not present

## 2017-12-06 DIAGNOSIS — M25561 Pain in right knee: Secondary | ICD-10-CM | POA: Diagnosis not present

## 2017-12-06 DIAGNOSIS — M545 Low back pain: Secondary | ICD-10-CM | POA: Diagnosis not present

## 2017-12-07 DIAGNOSIS — M5124 Other intervertebral disc displacement, thoracic region: Secondary | ICD-10-CM | POA: Diagnosis not present

## 2017-12-07 DIAGNOSIS — Z981 Arthrodesis status: Secondary | ICD-10-CM | POA: Diagnosis not present

## 2017-12-07 DIAGNOSIS — M47894 Other spondylosis, thoracic region: Secondary | ICD-10-CM | POA: Diagnosis not present

## 2017-12-07 DIAGNOSIS — M47892 Other spondylosis, cervical region: Secondary | ICD-10-CM | POA: Diagnosis not present

## 2017-12-07 DIAGNOSIS — M5117 Intervertebral disc disorders with radiculopathy, lumbosacral region: Secondary | ICD-10-CM | POA: Diagnosis not present

## 2017-12-07 DIAGNOSIS — M4726 Other spondylosis with radiculopathy, lumbar region: Secondary | ICD-10-CM | POA: Diagnosis not present

## 2017-12-07 DIAGNOSIS — M4807 Spinal stenosis, lumbosacral region: Secondary | ICD-10-CM | POA: Diagnosis not present

## 2017-12-07 DIAGNOSIS — Z4789 Encounter for other orthopedic aftercare: Secondary | ICD-10-CM | POA: Diagnosis not present

## 2017-12-07 DIAGNOSIS — M5134 Other intervertebral disc degeneration, thoracic region: Secondary | ICD-10-CM | POA: Diagnosis not present

## 2017-12-07 DIAGNOSIS — M438X4 Other specified deforming dorsopathies, thoracic region: Secondary | ICD-10-CM | POA: Diagnosis not present

## 2017-12-07 DIAGNOSIS — M4802 Spinal stenosis, cervical region: Secondary | ICD-10-CM | POA: Diagnosis not present

## 2017-12-07 DIAGNOSIS — M5116 Intervertebral disc disorders with radiculopathy, lumbar region: Secondary | ICD-10-CM | POA: Diagnosis not present

## 2017-12-07 DIAGNOSIS — M48061 Spinal stenosis, lumbar region without neurogenic claudication: Secondary | ICD-10-CM | POA: Diagnosis not present

## 2017-12-07 DIAGNOSIS — M2578 Osteophyte, vertebrae: Secondary | ICD-10-CM | POA: Diagnosis not present

## 2017-12-07 DIAGNOSIS — M50323 Other cervical disc degeneration at C6-C7 level: Secondary | ICD-10-CM | POA: Diagnosis not present

## 2017-12-13 DIAGNOSIS — S335XXD Sprain of ligaments of lumbar spine, subsequent encounter: Secondary | ICD-10-CM | POA: Diagnosis not present

## 2017-12-13 DIAGNOSIS — M25552 Pain in left hip: Secondary | ICD-10-CM | POA: Diagnosis not present

## 2017-12-13 DIAGNOSIS — M545 Low back pain: Secondary | ICD-10-CM | POA: Diagnosis not present

## 2017-12-13 DIAGNOSIS — M25561 Pain in right knee: Secondary | ICD-10-CM | POA: Diagnosis not present

## 2017-12-14 DIAGNOSIS — F4323 Adjustment disorder with mixed anxiety and depressed mood: Secondary | ICD-10-CM | POA: Diagnosis not present

## 2017-12-19 ENCOUNTER — Other Ambulatory Visit: Payer: Self-pay | Admitting: Obstetrics

## 2017-12-19 DIAGNOSIS — N6320 Unspecified lump in the left breast, unspecified quadrant: Secondary | ICD-10-CM

## 2017-12-20 DIAGNOSIS — M545 Low back pain: Secondary | ICD-10-CM | POA: Diagnosis not present

## 2017-12-20 DIAGNOSIS — S335XXD Sprain of ligaments of lumbar spine, subsequent encounter: Secondary | ICD-10-CM | POA: Diagnosis not present

## 2017-12-20 DIAGNOSIS — M25561 Pain in right knee: Secondary | ICD-10-CM | POA: Diagnosis not present

## 2017-12-20 DIAGNOSIS — M25552 Pain in left hip: Secondary | ICD-10-CM | POA: Diagnosis not present

## 2017-12-21 DIAGNOSIS — F4323 Adjustment disorder with mixed anxiety and depressed mood: Secondary | ICD-10-CM | POA: Diagnosis not present

## 2017-12-26 ENCOUNTER — Ambulatory Visit
Admission: RE | Admit: 2017-12-26 | Discharge: 2017-12-26 | Disposition: A | Discharge status: Home / Self Care | Payer: BLUE CROSS/BLUE SHIELD | Source: Ambulatory Visit | Attending: Obstetrics | Admitting: Obstetrics

## 2017-12-26 DIAGNOSIS — N6320 Unspecified lump in the left breast, unspecified quadrant: Secondary | ICD-10-CM

## 2017-12-26 DIAGNOSIS — N6002 Solitary cyst of left breast: Secondary | ICD-10-CM | POA: Diagnosis not present

## 2017-12-26 DIAGNOSIS — R922 Inconclusive mammogram: Secondary | ICD-10-CM | POA: Diagnosis not present

## 2017-12-27 DIAGNOSIS — S335XXD Sprain of ligaments of lumbar spine, subsequent encounter: Secondary | ICD-10-CM | POA: Diagnosis not present

## 2017-12-27 DIAGNOSIS — M25552 Pain in left hip: Secondary | ICD-10-CM | POA: Diagnosis not present

## 2017-12-27 DIAGNOSIS — M545 Low back pain: Secondary | ICD-10-CM | POA: Diagnosis not present

## 2017-12-27 DIAGNOSIS — M25561 Pain in right knee: Secondary | ICD-10-CM | POA: Diagnosis not present

## 2017-12-28 DIAGNOSIS — F4323 Adjustment disorder with mixed anxiety and depressed mood: Secondary | ICD-10-CM | POA: Diagnosis not present

## 2018-01-04 DIAGNOSIS — M4800 Spinal stenosis, site unspecified: Secondary | ICD-10-CM | POA: Diagnosis not present

## 2018-01-04 DIAGNOSIS — F4323 Adjustment disorder with mixed anxiety and depressed mood: Secondary | ICD-10-CM | POA: Diagnosis not present

## 2018-01-04 DIAGNOSIS — F439 Reaction to severe stress, unspecified: Secondary | ICD-10-CM | POA: Diagnosis not present

## 2018-01-10 DIAGNOSIS — S335XXD Sprain of ligaments of lumbar spine, subsequent encounter: Secondary | ICD-10-CM | POA: Diagnosis not present

## 2018-01-10 DIAGNOSIS — M25561 Pain in right knee: Secondary | ICD-10-CM | POA: Diagnosis not present

## 2018-01-10 DIAGNOSIS — M545 Low back pain: Secondary | ICD-10-CM | POA: Diagnosis not present

## 2018-01-10 DIAGNOSIS — M25552 Pain in left hip: Secondary | ICD-10-CM | POA: Diagnosis not present

## 2018-01-11 DIAGNOSIS — F4323 Adjustment disorder with mixed anxiety and depressed mood: Secondary | ICD-10-CM | POA: Diagnosis not present

## 2018-01-18 DIAGNOSIS — M25561 Pain in right knee: Secondary | ICD-10-CM | POA: Diagnosis not present

## 2018-01-18 DIAGNOSIS — M25552 Pain in left hip: Secondary | ICD-10-CM | POA: Diagnosis not present

## 2018-01-18 DIAGNOSIS — S335XXD Sprain of ligaments of lumbar spine, subsequent encounter: Secondary | ICD-10-CM | POA: Diagnosis not present

## 2018-01-18 DIAGNOSIS — M545 Low back pain: Secondary | ICD-10-CM | POA: Diagnosis not present

## 2018-01-18 DIAGNOSIS — F4323 Adjustment disorder with mixed anxiety and depressed mood: Secondary | ICD-10-CM | POA: Diagnosis not present

## 2018-01-25 DIAGNOSIS — M25561 Pain in right knee: Secondary | ICD-10-CM | POA: Diagnosis not present

## 2018-01-25 DIAGNOSIS — F4323 Adjustment disorder with mixed anxiety and depressed mood: Secondary | ICD-10-CM | POA: Diagnosis not present

## 2018-01-25 DIAGNOSIS — S335XXD Sprain of ligaments of lumbar spine, subsequent encounter: Secondary | ICD-10-CM | POA: Diagnosis not present

## 2018-01-25 DIAGNOSIS — M545 Low back pain: Secondary | ICD-10-CM | POA: Diagnosis not present

## 2018-01-25 DIAGNOSIS — M25552 Pain in left hip: Secondary | ICD-10-CM | POA: Diagnosis not present

## 2018-01-26 DIAGNOSIS — F333 Major depressive disorder, recurrent, severe with psychotic symptoms: Secondary | ICD-10-CM | POA: Diagnosis not present

## 2018-01-26 DIAGNOSIS — M545 Low back pain: Secondary | ICD-10-CM | POA: Diagnosis not present

## 2018-01-30 DIAGNOSIS — L600 Ingrowing nail: Secondary | ICD-10-CM | POA: Diagnosis not present

## 2018-02-01 DIAGNOSIS — M47817 Spondylosis without myelopathy or radiculopathy, lumbosacral region: Secondary | ICD-10-CM | POA: Diagnosis not present

## 2018-02-01 DIAGNOSIS — F4323 Adjustment disorder with mixed anxiety and depressed mood: Secondary | ICD-10-CM | POA: Diagnosis not present

## 2018-02-02 DIAGNOSIS — G894 Chronic pain syndrome: Secondary | ICD-10-CM | POA: Diagnosis not present

## 2018-02-02 DIAGNOSIS — M545 Low back pain: Secondary | ICD-10-CM | POA: Diagnosis not present

## 2018-02-02 DIAGNOSIS — M48062 Spinal stenosis, lumbar region with neurogenic claudication: Secondary | ICD-10-CM | POA: Diagnosis not present

## 2018-02-02 DIAGNOSIS — G8929 Other chronic pain: Secondary | ICD-10-CM | POA: Diagnosis not present

## 2018-02-08 DIAGNOSIS — F4323 Adjustment disorder with mixed anxiety and depressed mood: Secondary | ICD-10-CM | POA: Diagnosis not present

## 2018-02-15 DIAGNOSIS — F4323 Adjustment disorder with mixed anxiety and depressed mood: Secondary | ICD-10-CM | POA: Diagnosis not present

## 2018-02-17 ENCOUNTER — Other Ambulatory Visit: Payer: Self-pay

## 2018-02-17 DIAGNOSIS — B9689 Other specified bacterial agents as the cause of diseases classified elsewhere: Secondary | ICD-10-CM

## 2018-02-17 DIAGNOSIS — N760 Acute vaginitis: Principal | ICD-10-CM

## 2018-02-17 DIAGNOSIS — N898 Other specified noninflammatory disorders of vagina: Secondary | ICD-10-CM

## 2018-02-17 MED ORDER — FLUCONAZOLE 150 MG PO TABS: 150.0000 mg | ORAL_TABLET | Freq: Once | ORAL | 0 refills | Status: DC

## 2018-02-17 MED ORDER — METRONIDAZOLE 500 MG PO TABS: 500.0000 mg | ORAL_TABLET | Freq: Two times a day (BID) | ORAL | 0 refills | Status: DC

## 2018-02-17 MED ORDER — METRONIDAZOLE 0.75 % VA GEL: 1.0000 | Freq: Two times a day (BID) | VAGINAL | 2 refills | Status: DC

## 2018-02-17 NOTE — Progress Notes (Signed)
Pt wanted pill instead of metrogel.

## 2018-02-17 NOTE — Progress Notes (Signed)
Rx sent per pt request and protocol.

## 2018-02-22 DIAGNOSIS — F4323 Adjustment disorder with mixed anxiety and depressed mood: Secondary | ICD-10-CM | POA: Diagnosis not present

## 2018-02-23 DIAGNOSIS — L280 Lichen simplex chronicus: Secondary | ICD-10-CM | POA: Diagnosis not present

## 2018-02-23 DIAGNOSIS — L650 Telogen effluvium: Secondary | ICD-10-CM | POA: Diagnosis not present

## 2018-03-01 DIAGNOSIS — F4323 Adjustment disorder with mixed anxiety and depressed mood: Secondary | ICD-10-CM | POA: Diagnosis not present

## 2018-03-01 DIAGNOSIS — S335XXD Sprain of ligaments of lumbar spine, subsequent encounter: Secondary | ICD-10-CM | POA: Diagnosis not present

## 2018-03-01 DIAGNOSIS — M545 Low back pain: Secondary | ICD-10-CM | POA: Diagnosis not present

## 2018-03-01 DIAGNOSIS — M25552 Pain in left hip: Secondary | ICD-10-CM | POA: Diagnosis not present

## 2018-03-01 DIAGNOSIS — M25561 Pain in right knee: Secondary | ICD-10-CM | POA: Diagnosis not present

## 2018-03-08 DIAGNOSIS — F332 Major depressive disorder, recurrent severe without psychotic features: Secondary | ICD-10-CM | POA: Diagnosis not present

## 2018-03-08 DIAGNOSIS — F4323 Adjustment disorder with mixed anxiety and depressed mood: Secondary | ICD-10-CM | POA: Diagnosis not present

## 2018-03-10 DIAGNOSIS — G8929 Other chronic pain: Secondary | ICD-10-CM | POA: Diagnosis not present

## 2018-03-10 DIAGNOSIS — M5417 Radiculopathy, lumbosacral region: Secondary | ICD-10-CM | POA: Diagnosis not present

## 2018-03-15 DIAGNOSIS — F4323 Adjustment disorder with mixed anxiety and depressed mood: Secondary | ICD-10-CM | POA: Diagnosis not present

## 2018-03-20 DIAGNOSIS — F4323 Adjustment disorder with mixed anxiety and depressed mood: Secondary | ICD-10-CM | POA: Diagnosis not present

## 2018-03-22 DIAGNOSIS — M25552 Pain in left hip: Secondary | ICD-10-CM | POA: Diagnosis not present

## 2018-03-22 DIAGNOSIS — S335XXD Sprain of ligaments of lumbar spine, subsequent encounter: Secondary | ICD-10-CM | POA: Diagnosis not present

## 2018-03-22 DIAGNOSIS — M545 Low back pain: Secondary | ICD-10-CM | POA: Diagnosis not present

## 2018-03-22 DIAGNOSIS — M25561 Pain in right knee: Secondary | ICD-10-CM | POA: Diagnosis not present

## 2018-03-27 DIAGNOSIS — G8929 Other chronic pain: Secondary | ICD-10-CM | POA: Diagnosis not present

## 2018-03-27 DIAGNOSIS — M48062 Spinal stenosis, lumbar region with neurogenic claudication: Secondary | ICD-10-CM | POA: Diagnosis not present

## 2018-03-27 DIAGNOSIS — G894 Chronic pain syndrome: Secondary | ICD-10-CM | POA: Diagnosis not present

## 2018-03-27 DIAGNOSIS — M545 Low back pain: Secondary | ICD-10-CM | POA: Diagnosis not present

## 2018-03-29 DIAGNOSIS — F4323 Adjustment disorder with mixed anxiety and depressed mood: Secondary | ICD-10-CM | POA: Diagnosis not present

## 2018-04-03 DIAGNOSIS — F4323 Adjustment disorder with mixed anxiety and depressed mood: Secondary | ICD-10-CM | POA: Diagnosis not present

## 2018-04-04 DIAGNOSIS — M25561 Pain in right knee: Secondary | ICD-10-CM | POA: Diagnosis not present

## 2018-04-04 DIAGNOSIS — M545 Low back pain: Secondary | ICD-10-CM | POA: Diagnosis not present

## 2018-04-04 DIAGNOSIS — S335XXD Sprain of ligaments of lumbar spine, subsequent encounter: Secondary | ICD-10-CM | POA: Diagnosis not present

## 2018-04-04 DIAGNOSIS — M25552 Pain in left hip: Secondary | ICD-10-CM | POA: Diagnosis not present

## 2018-04-08 ENCOUNTER — Other Ambulatory Visit: Payer: Self-pay | Admitting: Obstetrics

## 2018-04-08 DIAGNOSIS — N898 Other specified noninflammatory disorders of vagina: Secondary | ICD-10-CM

## 2018-04-10 DIAGNOSIS — F4323 Adjustment disorder with mixed anxiety and depressed mood: Secondary | ICD-10-CM | POA: Diagnosis not present

## 2018-04-24 DIAGNOSIS — F4323 Adjustment disorder with mixed anxiety and depressed mood: Secondary | ICD-10-CM | POA: Diagnosis not present

## 2018-05-02 DIAGNOSIS — F4323 Adjustment disorder with mixed anxiety and depressed mood: Secondary | ICD-10-CM | POA: Diagnosis not present

## 2018-05-08 DIAGNOSIS — F4323 Adjustment disorder with mixed anxiety and depressed mood: Secondary | ICD-10-CM | POA: Diagnosis not present

## 2018-05-09 DIAGNOSIS — M545 Low back pain: Secondary | ICD-10-CM | POA: Diagnosis not present

## 2018-05-09 DIAGNOSIS — S335XXD Sprain of ligaments of lumbar spine, subsequent encounter: Secondary | ICD-10-CM | POA: Diagnosis not present

## 2018-05-09 DIAGNOSIS — M25552 Pain in left hip: Secondary | ICD-10-CM | POA: Diagnosis not present

## 2018-05-09 DIAGNOSIS — M25561 Pain in right knee: Secondary | ICD-10-CM | POA: Diagnosis not present

## 2018-05-16 DIAGNOSIS — F4323 Adjustment disorder with mixed anxiety and depressed mood: Secondary | ICD-10-CM | POA: Diagnosis not present

## 2018-05-23 DIAGNOSIS — Z981 Arthrodesis status: Secondary | ICD-10-CM | POA: Diagnosis not present

## 2018-05-23 DIAGNOSIS — G894 Chronic pain syndrome: Secondary | ICD-10-CM | POA: Diagnosis not present

## 2018-05-23 DIAGNOSIS — M545 Low back pain: Secondary | ICD-10-CM | POA: Diagnosis not present

## 2018-05-23 DIAGNOSIS — G8929 Other chronic pain: Secondary | ICD-10-CM | POA: Diagnosis not present

## 2018-05-23 DIAGNOSIS — M48062 Spinal stenosis, lumbar region with neurogenic claudication: Secondary | ICD-10-CM | POA: Diagnosis not present

## 2018-05-24 DIAGNOSIS — F332 Major depressive disorder, recurrent severe without psychotic features: Secondary | ICD-10-CM | POA: Diagnosis not present

## 2018-05-25 DIAGNOSIS — F4323 Adjustment disorder with mixed anxiety and depressed mood: Secondary | ICD-10-CM | POA: Diagnosis not present

## 2018-06-01 DIAGNOSIS — F4323 Adjustment disorder with mixed anxiety and depressed mood: Secondary | ICD-10-CM | POA: Diagnosis not present

## 2018-06-09 DIAGNOSIS — F4323 Adjustment disorder with mixed anxiety and depressed mood: Secondary | ICD-10-CM | POA: Diagnosis not present

## 2018-06-23 DIAGNOSIS — S335XXD Sprain of ligaments of lumbar spine, subsequent encounter: Secondary | ICD-10-CM | POA: Diagnosis not present

## 2018-06-23 DIAGNOSIS — M545 Low back pain: Secondary | ICD-10-CM | POA: Diagnosis not present

## 2018-06-23 DIAGNOSIS — M25552 Pain in left hip: Secondary | ICD-10-CM | POA: Diagnosis not present

## 2018-06-23 DIAGNOSIS — M25561 Pain in right knee: Secondary | ICD-10-CM | POA: Diagnosis not present

## 2018-06-30 DIAGNOSIS — F4323 Adjustment disorder with mixed anxiety and depressed mood: Secondary | ICD-10-CM | POA: Diagnosis not present

## 2018-07-06 DIAGNOSIS — F4310 Post-traumatic stress disorder, unspecified: Secondary | ICD-10-CM | POA: Insufficient documentation

## 2018-07-06 DIAGNOSIS — F339 Major depressive disorder, recurrent, unspecified: Secondary | ICD-10-CM | POA: Insufficient documentation

## 2018-07-07 DIAGNOSIS — F4323 Adjustment disorder with mixed anxiety and depressed mood: Secondary | ICD-10-CM | POA: Diagnosis not present

## 2018-07-10 DIAGNOSIS — F4323 Adjustment disorder with mixed anxiety and depressed mood: Secondary | ICD-10-CM | POA: Diagnosis not present

## 2018-07-19 ENCOUNTER — Ambulatory Visit: Payer: Self-pay | Admitting: Psychiatry

## 2018-07-19 DIAGNOSIS — M47816 Spondylosis without myelopathy or radiculopathy, lumbar region: Secondary | ICD-10-CM | POA: Diagnosis not present

## 2018-07-19 DIAGNOSIS — M48062 Spinal stenosis, lumbar region with neurogenic claudication: Secondary | ICD-10-CM | POA: Diagnosis not present

## 2018-07-19 DIAGNOSIS — G894 Chronic pain syndrome: Secondary | ICD-10-CM | POA: Diagnosis not present

## 2018-07-19 DIAGNOSIS — M25561 Pain in right knee: Secondary | ICD-10-CM | POA: Diagnosis not present

## 2018-07-24 DIAGNOSIS — F4323 Adjustment disorder with mixed anxiety and depressed mood: Secondary | ICD-10-CM | POA: Diagnosis not present

## 2018-07-28 DIAGNOSIS — Z0000 Encounter for general adult medical examination without abnormal findings: Secondary | ICD-10-CM | POA: Diagnosis not present

## 2018-08-02 DIAGNOSIS — Z0000 Encounter for general adult medical examination without abnormal findings: Secondary | ICD-10-CM | POA: Diagnosis not present

## 2018-08-02 DIAGNOSIS — R740 Nonspecific elevation of levels of transaminase and lactic acid dehydrogenase [LDH]: Secondary | ICD-10-CM | POA: Diagnosis not present

## 2018-08-02 DIAGNOSIS — Z23 Encounter for immunization: Secondary | ICD-10-CM | POA: Diagnosis not present

## 2018-08-15 ENCOUNTER — Other Ambulatory Visit: Payer: Self-pay | Admitting: Psychiatry

## 2018-08-16 ENCOUNTER — Ambulatory Visit (INDEPENDENT_AMBULATORY_CARE_PROVIDER_SITE_OTHER): Payer: BLUE CROSS/BLUE SHIELD | Admitting: Psychiatry

## 2018-08-16 ENCOUNTER — Encounter: Payer: Self-pay | Admitting: Psychiatry

## 2018-08-16 DIAGNOSIS — G894 Chronic pain syndrome: Secondary | ICD-10-CM

## 2018-08-16 DIAGNOSIS — R945 Abnormal results of liver function studies: Secondary | ICD-10-CM | POA: Diagnosis not present

## 2018-08-16 DIAGNOSIS — F331 Major depressive disorder, recurrent, moderate: Secondary | ICD-10-CM

## 2018-08-16 DIAGNOSIS — F4310 Post-traumatic stress disorder, unspecified: Secondary | ICD-10-CM | POA: Diagnosis not present

## 2018-08-16 DIAGNOSIS — R140 Abdominal distension (gaseous): Secondary | ICD-10-CM | POA: Diagnosis not present

## 2018-08-16 MED ORDER — LEVOMILNACIPRAN HCL ER 40 MG PO CP24: 40.0000 mg | ORAL_CAPSULE | Freq: Every day | ORAL | 3 refills | Status: DC

## 2018-08-16 NOTE — Progress Notes (Signed)
Felicia BangsCheryl M Wilcox [id number] [birth year] 57 y.o.  Subjective:   Patient ID:  Felicia BangsCheryl M Wilcox is a 57 y.o. (DOB [birth year]) female.  Chief Complaint:  Chief Complaint  Patient presents with  . Depression  . Anxiety    HPI Felicia Wilcox presents to the office today for follow-up of depression, anxiety and chronic pain.  She thinks the pain causes the psych sx that she has.  Interested in increasing the Fetzima to see if it might help pain as we discussed it might in off label use.  Fetzima has helped depression and anxiety and pain somewhat.  Generally  Tolerated it well.  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  It increases her pain which increases her depression and anxiety.  Stress seasonal job and trouble finding jobs she can do bc of back paiin Pt reports that mood is situational  Anxious and Depressed and describes anxiety as Moderate. Anxiety symptoms include: Excessive Worry,. Pt reports has interrupted sleep, has frequent nighttime awakenings, awakens early, is not rested upon awakening and bladder problems worsen it. Pt reports that appetite is good. Pt reports that energy is lethargic and no change. Concentration is good. Suicidal thoughts:  denied by patient.    Review of Systems:  Review of Systems  Genitourinary: Positive for difficulty urinating.  Musculoskeletal: Positive for arthralgias and back pain.  Neurological: Negative for tremors and weakness.  Psychiatric/Behavioral: Positive for dysphoric mood and sleep disturbance. Negative for agitation, behavioral problems, confusion, decreased concentration, hallucinations, self-injury and suicidal ideas. The patient is not nervous/anxious and is not hyperactive.     Medications: I have reviewed the patient's current medications.  Current Outpatient Medications  Medication Sig Dispense Refill  . Ascorbic Acid (VITAMIN C WITH ROSE HIPS) 500 MG tablet Take 500 mg by mouth daily.    . Multiple Vitamin (MULTIVITAMIN WITH  MINERALS) TABS tablet Take 1 tablet by mouth daily.    Marland Kitchen. tiZANidine (ZANAFLEX) 4 MG capsule Take 4 mg by mouth 3 (three) times daily.    . Zinc Acetate, Oral, (ZINC ACETATE PO) Take 1 tablet by mouth daily.    Marland Kitchen. zinc gluconate 50 MG tablet Take 50 mg by mouth daily.    Marland Kitchen. amitriptyline (ELAVIL) 10 MG tablet     . BLACK COHOSH PO Take 1 tablet by mouth daily.    . clotrimazole-betamethasone (LOTRISONE) cream Apply to affected area 2 times daily prn (Patient not taking: Reported on 12/03/2016) 15 g 0  . fluconazole (DIFLUCAN) 150 MG tablet TAKE ONE TABLET BY MOUTH AS A ONE-TIME DOSE (Patient not taking: Reported on 08/16/2018) 1 tablet 0  . Levomilnacipran HCl ER (FETZIMA) 40 MG CP24 Take 40 mg by mouth daily. 30 capsule 3  . metroNIDAZOLE (FLAGYL) 500 MG tablet Take 1 tablet (500 mg total) by mouth 2 (two) times daily. (Patient not taking: Reported on 08/16/2018) 14 tablet 0  . metroNIDAZOLE (METROGEL VAGINAL) 0.75 % vaginal gel Place 1 Applicatorful vaginally 2 (two) times daily. Apply bid  for 5 days. (Patient not taking: Reported on 08/16/2018) 70 g 2   No current facility-administered medications for this visit.     Medication Side Effects: Other: ? urinary hesitancy and frequency.  Allergies:  Allergies  Allergen Reactions  . Aspartame And Phenylalanine Anaphylaxis  . Codeine     "Bleed internally"   . Sulfa Antibiotics Nausea And Vomiting    Past Medical History:  Diagnosis Date  . Anxiety   . Arthritis  2 herniated disc lower back  . Chronic lower back pain   . GERD (gastroesophageal reflux disease)   . Sleep apnea    mild - does not use CPAP machine  . Smoker     Family History  Problem Relation Age of Onset  . Heart disease Mother   . Cancer Father   . Diabetes Maternal Grandmother   . Stroke Sister   . Heart disease Sister   . Breast cancer Neg Hx     Social History   Socioeconomic History  . Marital status: Single    Spouse name: Not on file  . Number of  children: 1  . Years of education: BA  . Highest education level: Not on file  Occupational History  . Not on file  Social Needs  . Financial resource strain: Not on file  . Food insecurity:    Worry: Not on file    Inability: Not on file  . Transportation needs:    Medical: Not on file    Non-medical: Not on file  Tobacco Use  . Smoking status: Current Every Day Smoker    Packs/day: 0.50    Years: 6.00    Pack years: 3.00    Types: Cigarettes  . Smokeless tobacco: Never Used  Substance and Sexual Activity  . Alcohol use: No    Alcohol/week: 0.0 standard drinks    Comment: Quits 1 month ago.  . Drug use: No  . Sexual activity: Not Currently    Birth control/protection: Post-menopausal  Lifestyle  . Physical activity:    Days per week: Not on file    Minutes per session: Not on file  . Stress: Not on file  Relationships  . Social connections:    Talks on phone: Not on file    Gets together: Not on file    Attends religious service: Not on file    Active member of club or organization: Not on file    Attends meetings of clubs or organizations: Not on file    Relationship status: Not on file  . Intimate partner violence:    Fear of current or ex partner: Not on file    Emotionally abused: Not on file    Physically abused: Not on file    Forced sexual activity: Not on file  Other Topics Concern  . Not on file  Social History Narrative   Drinks 2-3 cups of coffee a day     Past Medical History, Surgical history, Social history, and Family history were reviewed and updated as appropriate.   Please see review of systems for further details on the patient's review from today.   Objective:   Physical Exam:  There were no vitals taken for this visit.  Physical Exam  Constitutional: She is oriented to person, place, and time. She appears well-developed. No distress.  Musculoskeletal: She exhibits no deformity.  Neurological: She is alert and oriented to person,  place, and time. She displays no tremor. Coordination and gait normal.  Psychiatric: Her speech is normal and behavior is normal. Judgment and thought content normal. Her mood appears anxious. Her affect is not angry, not blunt, not labile and not inappropriate. Cognition and memory are normal. She exhibits a depressed mood. She expresses no homicidal and no suicidal ideation. She expresses no suicidal plans and no homicidal plans.  Insight intact. No auditory or visual hallucinations.  Stressed situationally.     Lab Review:     Component Value Date/Time  NA 140 11/11/2015 1005   K 4.6 11/11/2015 1005   CL 102 11/11/2015 1005   CO2 25 11/11/2015 1005   GLUCOSE 93 11/11/2015 1005   BUN 12 11/11/2015 1005   CREATININE 0.71 11/11/2015 1005   CALCIUM 10.1 11/11/2015 1005   PROT 6.6 11/11/2015 1005   ALBUMIN 4.7 11/11/2015 1005   AST 17 11/11/2015 1005   ALT 34 (H) 11/11/2015 1005   ALKPHOS 44 11/11/2015 1005   BILITOT 0.2 11/11/2015 1005   GFRNONAA 96 11/11/2015 1005   GFRAA 110 11/11/2015 1005       Component Value Date/Time   WBC 5.7 11/11/2015 1005   WBC 5.8 09/13/2014 1205   RBC 4.60 11/11/2015 1005   RBC 4.37 09/13/2014 1205   HGB 15.0 11/11/2015 1005   HCT 44.1 11/11/2015 1005   PLT 313 11/11/2015 1005   MCV 96 11/11/2015 1005   MCH 32.6 11/11/2015 1005   MCH 33.9 09/13/2014 1205   MCHC 34.0 11/11/2015 1005   MCHC 35.1 09/13/2014 1205   RDW 12.2 (L) 11/11/2015 1005   LYMPHSABS 1.4 11/11/2015 1005   EOSABS 0.1 11/11/2015 1005   BASOSABS 0.0 11/11/2015 1005    No results found for: POCLITH, LITHIUM   No results found for: PHENYTOIN, PHENOBARB, VALPROATE, CBMZ   .res Assessment: Plan:    Major depressive disorder, recurrent episode, moderate (HCC)  PTSD (post-traumatic stress disorder)  Chronic pain syndrome   As discussed increase Fetzima to 40 mg.  She has history of med sensitivity.  Disc SE including potiential bladder issues.  This appt was 30  mins.  FU 6 mos.  Meredith Staggers, MD, DFAPA   Please see After Visit Summary for patient specific instructions.  No future appointments.  No orders of the defined types were placed in this encounter.     -------------------------------

## 2018-08-31 DIAGNOSIS — M79644 Pain in right finger(s): Secondary | ICD-10-CM | POA: Diagnosis not present

## 2018-08-31 DIAGNOSIS — F4323 Adjustment disorder with mixed anxiety and depressed mood: Secondary | ICD-10-CM | POA: Diagnosis not present

## 2018-08-31 DIAGNOSIS — M13841 Other specified arthritis, right hand: Secondary | ICD-10-CM | POA: Diagnosis not present

## 2018-09-07 DIAGNOSIS — R945 Abnormal results of liver function studies: Secondary | ICD-10-CM | POA: Diagnosis not present

## 2018-09-14 DIAGNOSIS — R945 Abnormal results of liver function studies: Secondary | ICD-10-CM | POA: Diagnosis not present

## 2018-09-25 DIAGNOSIS — F4323 Adjustment disorder with mixed anxiety and depressed mood: Secondary | ICD-10-CM | POA: Diagnosis not present

## 2018-09-27 DIAGNOSIS — F4323 Adjustment disorder with mixed anxiety and depressed mood: Secondary | ICD-10-CM | POA: Diagnosis not present

## 2018-10-04 ENCOUNTER — Other Ambulatory Visit: Payer: Self-pay | Admitting: Psychiatry

## 2018-10-09 ENCOUNTER — Telehealth: Payer: Self-pay | Admitting: Psychiatry

## 2018-10-09 NOTE — Telephone Encounter (Signed)
Pt. Called and said that she is applying for patient assistance for her 40 mg fetizma. This process will take 4 to five weeks. She will run out of the medicine on January 21 st. She wants to know if you can contact the rep of that medicine and see if you can get samples of this medicine. Please let me know and I will contact the patient.

## 2018-10-10 NOTE — Telephone Encounter (Signed)
Pt contacted to pick up samples.

## 2018-10-11 DIAGNOSIS — F4323 Adjustment disorder with mixed anxiety and depressed mood: Secondary | ICD-10-CM | POA: Diagnosis not present

## 2018-10-18 ENCOUNTER — Encounter: Payer: Self-pay | Admitting: Psychiatry

## 2018-10-20 ENCOUNTER — Telehealth: Payer: Self-pay | Admitting: Psychiatry

## 2018-10-20 NOTE — Telephone Encounter (Signed)
Faxed application for Pt Assistance to [facility] for Fetzima.  Rx sent with application

## 2018-10-20 NOTE — Telephone Encounter (Signed)
Patient has applied for Pt. Assistance for Fetzima.  Rx mailed w/ application

## 2018-11-02 DIAGNOSIS — J101 Influenza due to other identified influenza virus with other respiratory manifestations: Secondary | ICD-10-CM | POA: Diagnosis not present

## 2018-11-02 DIAGNOSIS — E860 Dehydration: Secondary | ICD-10-CM | POA: Diagnosis not present

## 2018-11-02 DIAGNOSIS — J069 Acute upper respiratory infection, unspecified: Secondary | ICD-10-CM | POA: Diagnosis not present

## 2018-11-08 DIAGNOSIS — R05 Cough: Secondary | ICD-10-CM | POA: Diagnosis not present

## 2018-11-28 DIAGNOSIS — Z131 Encounter for screening for diabetes mellitus: Secondary | ICD-10-CM | POA: Diagnosis not present

## 2018-11-28 DIAGNOSIS — R740 Nonspecific elevation of levels of transaminase and lactic acid dehydrogenase [LDH]: Secondary | ICD-10-CM | POA: Diagnosis not present

## 2018-12-05 DIAGNOSIS — R231 Pallor: Secondary | ICD-10-CM | POA: Diagnosis not present

## 2018-12-05 DIAGNOSIS — R740 Nonspecific elevation of levels of transaminase and lactic acid dehydrogenase [LDH]: Secondary | ICD-10-CM | POA: Diagnosis not present

## 2018-12-05 DIAGNOSIS — I7300 Raynaud's syndrome without gangrene: Secondary | ICD-10-CM | POA: Diagnosis not present

## 2018-12-05 DIAGNOSIS — R7303 Prediabetes: Secondary | ICD-10-CM | POA: Diagnosis not present

## 2018-12-13 DIAGNOSIS — J069 Acute upper respiratory infection, unspecified: Secondary | ICD-10-CM | POA: Diagnosis not present

## 2018-12-13 DIAGNOSIS — Z8709 Personal history of other diseases of the respiratory system: Secondary | ICD-10-CM | POA: Diagnosis not present

## 2018-12-13 DIAGNOSIS — R05 Cough: Secondary | ICD-10-CM | POA: Diagnosis not present

## 2018-12-13 DIAGNOSIS — J029 Acute pharyngitis, unspecified: Secondary | ICD-10-CM | POA: Diagnosis not present

## 2019-01-08 DIAGNOSIS — M170 Bilateral primary osteoarthritis of knee: Secondary | ICD-10-CM | POA: Diagnosis not present

## 2019-01-08 DIAGNOSIS — M79671 Pain in right foot: Secondary | ICD-10-CM | POA: Diagnosis not present

## 2019-01-08 DIAGNOSIS — M26621 Arthralgia of right temporomandibular joint: Secondary | ICD-10-CM | POA: Diagnosis not present

## 2019-01-08 DIAGNOSIS — M2550 Pain in unspecified joint: Secondary | ICD-10-CM | POA: Diagnosis not present

## 2019-01-08 DIAGNOSIS — M26622 Arthralgia of left temporomandibular joint: Secondary | ICD-10-CM | POA: Diagnosis not present

## 2019-01-08 DIAGNOSIS — M19071 Primary osteoarthritis, right ankle and foot: Secondary | ICD-10-CM | POA: Diagnosis not present

## 2019-01-08 DIAGNOSIS — I7300 Raynaud's syndrome without gangrene: Secondary | ICD-10-CM | POA: Diagnosis not present

## 2019-01-08 DIAGNOSIS — M79642 Pain in left hand: Secondary | ICD-10-CM | POA: Diagnosis not present

## 2019-01-08 DIAGNOSIS — M791 Myalgia, unspecified site: Secondary | ICD-10-CM | POA: Diagnosis not present

## 2019-01-08 DIAGNOSIS — M79641 Pain in right hand: Secondary | ICD-10-CM | POA: Diagnosis not present

## 2019-01-08 DIAGNOSIS — G8929 Other chronic pain: Secondary | ICD-10-CM | POA: Diagnosis not present

## 2019-01-08 DIAGNOSIS — M25561 Pain in right knee: Secondary | ICD-10-CM | POA: Diagnosis not present

## 2019-01-17 DIAGNOSIS — M2550 Pain in unspecified joint: Secondary | ICD-10-CM | POA: Diagnosis not present

## 2019-01-17 DIAGNOSIS — D8989 Other specified disorders involving the immune mechanism, not elsewhere classified: Secondary | ICD-10-CM | POA: Diagnosis not present

## 2019-02-07 DIAGNOSIS — F4323 Adjustment disorder with mixed anxiety and depressed mood: Secondary | ICD-10-CM | POA: Diagnosis not present

## 2019-02-23 ENCOUNTER — Other Ambulatory Visit: Payer: Self-pay | Admitting: Internal Medicine

## 2019-02-23 DIAGNOSIS — Z1231 Encounter for screening mammogram for malignant neoplasm of breast: Secondary | ICD-10-CM

## 2019-03-08 DIAGNOSIS — F4323 Adjustment disorder with mixed anxiety and depressed mood: Secondary | ICD-10-CM | POA: Diagnosis not present

## 2019-03-14 ENCOUNTER — Other Ambulatory Visit: Payer: Self-pay | Admitting: Obstetrics

## 2019-03-14 DIAGNOSIS — B9689 Other specified bacterial agents as the cause of diseases classified elsewhere: Secondary | ICD-10-CM

## 2019-03-14 NOTE — Telephone Encounter (Signed)
Name from pharmacy: metroNIDAZOLE 0.75 % Vaginal Gel        Will file in chart as: metroNIDAZOLE (METROGEL) 0.75 % vaginal gel   Sig: INSERT 1 APPLICATORFUL VAGINALLY TWICE DAILY FOR 5 DAYS   Disp:  70 g  Refills:  0   Start: 03/14/2019   Class: Normal   Non-formulary For: BV (bacterial vaginosis)   Last ordered: 1 year ago by Shelly Bombard, MD Last refill: 04/02/2018

## 2019-03-15 DIAGNOSIS — L600 Ingrowing nail: Secondary | ICD-10-CM | POA: Diagnosis not present

## 2019-04-05 DIAGNOSIS — M2021 Hallux rigidus, right foot: Secondary | ICD-10-CM | POA: Diagnosis not present

## 2019-04-05 DIAGNOSIS — M2022 Hallux rigidus, left foot: Secondary | ICD-10-CM | POA: Diagnosis not present

## 2019-04-05 DIAGNOSIS — L600 Ingrowing nail: Secondary | ICD-10-CM | POA: Diagnosis not present

## 2019-04-09 DIAGNOSIS — F4323 Adjustment disorder with mixed anxiety and depressed mood: Secondary | ICD-10-CM | POA: Diagnosis not present

## 2019-04-12 ENCOUNTER — Ambulatory Visit
Admission: RE | Admit: 2019-04-12 | Discharge: 2019-04-12 | Disposition: A | Discharge status: Home / Self Care | Payer: BC Managed Care – PPO | Source: Ambulatory Visit | Attending: Internal Medicine | Admitting: Internal Medicine

## 2019-04-12 DIAGNOSIS — Z1231 Encounter for screening mammogram for malignant neoplasm of breast: Secondary | ICD-10-CM

## 2019-05-08 DIAGNOSIS — M25561 Pain in right knee: Secondary | ICD-10-CM | POA: Diagnosis not present

## 2019-05-08 DIAGNOSIS — M48062 Spinal stenosis, lumbar region with neurogenic claudication: Secondary | ICD-10-CM | POA: Diagnosis not present

## 2019-05-08 DIAGNOSIS — G894 Chronic pain syndrome: Secondary | ICD-10-CM | POA: Diagnosis not present

## 2019-05-08 DIAGNOSIS — M47816 Spondylosis without myelopathy or radiculopathy, lumbar region: Secondary | ICD-10-CM | POA: Diagnosis not present

## 2019-05-14 DIAGNOSIS — L309 Dermatitis, unspecified: Secondary | ICD-10-CM | POA: Diagnosis not present

## 2019-05-16 DIAGNOSIS — M533 Sacrococcygeal disorders, not elsewhere classified: Secondary | ICD-10-CM | POA: Diagnosis not present

## 2019-05-28 ENCOUNTER — Telehealth: Payer: Self-pay | Admitting: Psychiatry

## 2019-05-28 NOTE — Telephone Encounter (Signed)
Sharlot called to request refill of her Fetzima from [facility].  I called [facility] and reorder her next 90 day supply.  It will be delivered in next to 7-10 days to our office. There is not a way to escribe.

## 2019-05-29 NOTE — Telephone Encounter (Signed)
Noted patient uses Patient Assistance for her Terie Purser

## 2019-07-31 ENCOUNTER — Other Ambulatory Visit: Payer: Self-pay

## 2019-07-31 ENCOUNTER — Encounter: Payer: Self-pay | Admitting: Obstetrics

## 2019-07-31 ENCOUNTER — Ambulatory Visit: Payer: Medicaid Other | Admitting: Obstetrics

## 2019-07-31 ENCOUNTER — Other Ambulatory Visit (HOSPITAL_COMMUNITY)
Admission: RE | Admit: 2019-07-31 | Discharge: 2019-07-31 | Disposition: A | Discharge status: Home / Self Care | Payer: Medicaid Other | Source: Ambulatory Visit | Attending: Obstetrics | Admitting: Obstetrics

## 2019-07-31 VITALS — BP 160/107 | HR 97 | Wt 126.4 lb

## 2019-07-31 DIAGNOSIS — Z01419 Encounter for gynecological examination (general) (routine) without abnormal findings: Secondary | ICD-10-CM | POA: Diagnosis present

## 2019-07-31 DIAGNOSIS — Z0000 Encounter for general adult medical examination without abnormal findings: Secondary | ICD-10-CM | POA: Diagnosis not present

## 2019-07-31 DIAGNOSIS — B9689 Other specified bacterial agents as the cause of diseases classified elsewhere: Secondary | ICD-10-CM

## 2019-07-31 DIAGNOSIS — N898 Other specified noninflammatory disorders of vagina: Secondary | ICD-10-CM | POA: Insufficient documentation

## 2019-07-31 DIAGNOSIS — N760 Acute vaginitis: Secondary | ICD-10-CM

## 2019-07-31 DIAGNOSIS — N951 Menopausal and female climacteric states: Secondary | ICD-10-CM

## 2019-07-31 MED ORDER — METRONIDAZOLE 500 MG PO TABS: 500.0000 mg | ORAL_TABLET | Freq: Two times a day (BID) | ORAL | 6 refills | Status: DC

## 2019-07-31 MED ORDER — METRONIDAZOLE 0.75 % VA GEL: 1.0000 | Freq: Two times a day (BID) | VAGINAL | 6 refills | Status: DC

## 2019-07-31 NOTE — Progress Notes (Signed)
Patient is in the office for annual, last pap 12-03-16. Last mammogram 04-12-19 Pt reports hx of BV.

## 2019-07-31 NOTE — Progress Notes (Signed)
Subjective:        Felicia Wilcox is a 58 y.o. female here for a routine exam.  Current complaints: Hot flashes.  Also continues to have recurrent BV, but manages it well with a regimen of Metronidazole, MetroGel and Boric Acid.  Personal health questionnaire:  Is patient Ashkenazi Jewish, have a family history of breast and/or ovarian cancer: yes Is there a family history of uterine cancer diagnosed at age < 100, gastrointestinal cancer, urinary tract cancer, family member who is a Personnel officer syndrome-associated carrier: no Is the patient overweight and hypertensive, family history of diabetes, personal history of gestational diabetes, preeclampsia or PCOS: yes Is patient over 75, have PCOS,  family history of premature CHD under age 97, diabetes, smoke, have hypertension or peripheral artery disease:  yes At any time, has a partner hit, kicked or otherwise hurt or frightened you?: no Over the past 2 weeks, have you felt down, depressed or hopeless?: no Over the past 2 weeks, have you felt little interest or pleasure in doing things?:no   Gynecologic History No LMP recorded. Patient is postmenopausal. Contraception: post menopausal status Last Pap: 12-03-2016. Results were: normal Last mammogram: 12-26-2017. Results were: normal  Obstetric History OB History  Gravida Para Term Preterm AB Living  1         1  SAB TAB Ectopic Multiple Live Births          1    # Outcome Date GA Lbr Len/2nd Weight Sex Delivery Anes PTL Lv  1 Gravida 07/05/91    M CS-LTranv EPI  LIV    Past Medical History:  Diagnosis Date  . Anxiety   . Arthritis    2 herniated disc lower back  . Chronic lower back pain   . GERD (gastroesophageal reflux disease)   . Sleep apnea    mild - does not use CPAP machine  . Smoker     Past Surgical History:  Procedure Laterality Date  . Abalation     . BACK SURGERY     x 3 - branch block lower back injections -block nerve endings  . BARTHOLIN GLAND CYST EXCISION     . CESAREAN SECTION    . HYSTEROSCOPY N/A 09/13/2014   Procedure: HYSTEROSCOPY / REMOVAL OF INTRAUTERINE DEVICE;  Surgeon: Antionette Char, MD;  Location: [facility];  Service: Gynecology;  Laterality: N/A;  IUD removal  . NECK SURGERY     ? C3-4-5 with rods  . TONSILLECTOMY    . WISDOM TOOTH EXTRACTION       Current Outpatient Medications:  .  Ascorbic Acid (VITAMIN C WITH ROSE HIPS) 500 MG tablet, Take 500 mg by mouth daily., Disp: , Rfl:  .  Levomilnacipran HCl ER (FETZIMA) 40 MG CP24, Take 40 mg by mouth daily., Disp: 30 capsule, Rfl: 3 .  Multiple Vitamin (MULTIVITAMIN WITH MINERALS) TABS tablet, Take 1 tablet by mouth daily., Disp: , Rfl:  .  Zinc Acetate, Oral, (ZINC ACETATE PO), Take 1 tablet by mouth daily., Disp: , Rfl:  .  zinc gluconate 50 MG tablet, Take 50 mg by mouth daily., Disp: , Rfl:  .  amitriptyline (ELAVIL) 10 MG tablet, , Disp: , Rfl:  .  BLACK COHOSH PO, Take 1 tablet by mouth daily., Disp: , Rfl:  .  clotrimazole-betamethasone (LOTRISONE) cream, Apply to affected area 2 times daily prn (Patient not taking: Reported on 12/03/2016), Disp: 15 g, Rfl: 0 .  fluconazole (DIFLUCAN) 150 MG tablet, TAKE ONE TABLET  BY MOUTH AS A ONE-TIME DOSE (Patient not taking: Reported on 08/16/2018), Disp: 1 tablet, Rfl: 0 .  metroNIDAZOLE (FLAGYL) 500 MG tablet, Take 1 tablet (500 mg total) by mouth 2 (two) times daily. (Patient not taking: Reported on 08/16/2018), Disp: 14 tablet, Rfl: 0 .  metroNIDAZOLE (FLAGYL) 500 MG tablet, Take 1 tablet (500 mg total) by mouth 2 (two) times daily., Disp: 14 tablet, Rfl: 6 .  metroNIDAZOLE (METROGEL VAGINAL) 0.75 % vaginal gel, Place 1 Applicatorful vaginally 2 (two) times daily., Disp: 70 g, Rfl: 6 .  metroNIDAZOLE (METROGEL) 0.75 % vaginal gel, INSERT 1 APPLICATORFUL VAGINALLY TWICE DAILY FOR 5 DAYS (Patient not taking: Reported on 07/31/2019), Disp: 70 g, Rfl: 2 .  tiZANidine (ZANAFLEX) 4 MG capsule, Take 4 mg by mouth 3 (three) times daily., Disp: ,  Rfl:  Allergies  Allergen Reactions  . Aspartame And Phenylalanine Anaphylaxis  . Codeine     "Bleed internally"   . Sulfa Antibiotics Nausea And Vomiting    Social History   Tobacco Use  . Smoking status: Former Smoker    Packs/day: 0.50    Years: 6.00    Pack years: 3.00    Types: Cigarettes  . Smokeless tobacco: Never Used  Substance Use Topics  . Alcohol use: No    Alcohol/week: 0.0 standard drinks    Comment: Quits 1 month ago.    Family History  Problem Relation Age of Onset  . Heart disease Mother   . Cancer Father   . Diabetes Maternal Grandmother   . Stroke Sister   . Heart disease Sister   . Breast cancer Neg Hx       Review of Systems  Constitutional: negative for fatigue and weight loss Respiratory: negative for cough and wheezing Cardiovascular: negative for chest pain, fatigue and palpitations Gastrointestinal: negative for abdominal pain and change in bowel habits Musculoskeletal:negative for myalgias Neurological: negative for gait problems and tremors Behavioral/Psych: negative for abusive relationship, depression Endocrine: negative for temperature intolerance    Genitourinary:negative for abnormal menstrual periods, genital lesions, hot flashes, sexual problems and vaginal discharge Integument/breast: negative for breast lump, breast tenderness, nipple discharge and skin lesion(s)    Objective:       BP (!) 160/107   Pulse 97   Wt 126 lb 6.4 oz (57.3 kg)   BMI 22.39 kg/m  General:   alert  Skin:   no rash or abnormalities  Lungs:   clear to auscultation bilaterally  Heart:   regular rate and rhythm, S1, S2 normal, no murmur, click, rub or gallop  Breasts:   normal without suspicious masses, skin or nipple changes or axillary nodes  Abdomen:  normal findings: no organomegaly, soft, non-tender and no hernia  Pelvis:  External genitalia: normal general appearance Urinary system: urethral meatus normal and bladder without fullness,  nontender Vaginal: normal without tenderness, induration or masses Cervix: normal appearance Adnexa: normal bimanual exam Uterus: anteverted and non-tender, normal size   Lab Review Urine pregnancy test Labs reviewed yes Radiologic studies reviewed yes  50% of 25 min visit spent on counseling and coordination of care.   Assessment:     1. Encounter for gynecological examination with Papanicolaou smear of cervix Rx: - Cytology - PAP( [place])  2. Vaginal discharge Rx: - Cervicovaginal ancillary only( [place])  3. BV (bacterial vaginosis) Rx: - metroNIDAZOLE (METROGEL VAGINAL) 0.75 % vaginal gel; Place 1 Applicatorful vaginally 2 (two) times daily.  Dispense: 70 g; Refill: 6 - metroNIDAZOLE (FLAGYL) 500  MG tablet; Take 1 tablet (500 mg total) by mouth 2 (two) times daily.  Dispense: 14 tablet; Refill: 6  4. Hot flashes due to menopause - patient will try natural remedies for hot flashes    Plan:    Education reviewed: calcium supplements, depression evaluation, low fat, low cholesterol diet, safe sex/STD prevention, self breast exams, skin cancer screening and weight bearing exercise. Follow up in: 2 years.   Meds ordered this encounter  Medications  . metroNIDAZOLE (METROGEL VAGINAL) 0.75 % vaginal gel    Sig: Place 1 Applicatorful vaginally 2 (two) times daily.    Dispense:  70 g    Refill:  6  . metroNIDAZOLE (FLAGYL) 500 MG tablet    Sig: Take 1 tablet (500 mg total) by mouth 2 (two) times daily.    Dispense:  14 tablet    Refill:  6     Brock BadHarper, Kito Cuffe A, MD 07/31/2019 3:12 PM

## 2019-08-02 ENCOUNTER — Other Ambulatory Visit: Payer: Self-pay | Admitting: Obstetrics

## 2019-08-02 LAB — CERVICOVAGINAL ANCILLARY ONLY
Bacterial Vaginitis (gardnerella): POSITIVE — AB
Comment: NEGATIVE
Comment: NEGATIVE
Comment: NEGATIVE

## 2019-08-03 LAB — CYTOLOGY - PAP
Comment: NEGATIVE
Diagnosis: NEGATIVE
High risk HPV: NEGATIVE

## 2019-11-07 ENCOUNTER — Ambulatory Visit (INDEPENDENT_AMBULATORY_CARE_PROVIDER_SITE_OTHER): Payer: Self-pay | Admitting: Psychiatry

## 2019-11-07 ENCOUNTER — Other Ambulatory Visit: Payer: Self-pay

## 2019-11-07 ENCOUNTER — Encounter: Payer: Self-pay | Admitting: Psychiatry

## 2019-11-07 DIAGNOSIS — F331 Major depressive disorder, recurrent, moderate: Secondary | ICD-10-CM

## 2019-11-07 DIAGNOSIS — G894 Chronic pain syndrome: Secondary | ICD-10-CM

## 2019-11-07 DIAGNOSIS — F4310 Post-traumatic stress disorder, unspecified: Secondary | ICD-10-CM

## 2019-11-07 MED ORDER — FETZIMA 40 MG PO CP24: 40.0000 mg | ORAL_CAPSULE | Freq: Every day | ORAL | 11 refills | Status: DC

## 2019-11-07 NOTE — Progress Notes (Signed)
Felicia Wilcox [id number] 07-Sep-1961 59 y.o.  Subjective:   Patient ID:  Felicia Wilcox is a 59 y.o. (DOB 1961-08-21) female.  Chief Complaint:  Chief Complaint  Patient presents with  . Follow-up    Medication Management  . Depression    Medication Management    HPI Felicia Wilcox presents to the office today for follow-up of depression and anxiety and chronic pain problems.    November 2019.  No meds were changed. Continued Fetzima 40mg .   Covid free.  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  Stress isolation.  Family far away.  Conflict over politics with friends and family.  Feels like everyone is in the same place.  Not really depression.  Fetzima helps pain.  Patient reports stable mood and denies  irritable moods. Little dips into depression and wonders if there is going to be medication options for her given her history of med sensitivity..  Not gracefully aging.  Patient denies any recent difficulty with anxiety.  Patient denies difficulty with sleep initiation or maintenance with hydroxyzine.   Denies appetite disturbance.  Patient reports that energy and motivation have been good.  Patient denies any difficulty with concentration.  Patient denies any suicidal ideation.  Struggles never successful getting into a career.    Disability since July 2020 and got Medicaid.  Can only have 22 doctor visits per year.  Chronic $ stress.   Wonders about medical problems related to the meds.  Has list medical concerns including hair loss. Sweats. Borderline glaucoma and hypertension.  Dry eyes. Puckers lips.  Looked up SE.    Past Psychiatric Medication Trials: Fetzima, duloxetine, sertraline abnormal periods, fluoxetine side effects, paroxetine brief side effects, citalopram, Lexapro 15,, venlafaxine flat, nefazodone, buspirone, Wellbutrin no response Hydroxyzine, Ambien, Sonata, Provigil, lithium brief,  Pindolol, Chantix Med sensitive.  Review of Systems:  Review of Systems   Constitutional:       Sweats  Eyes: Positive for visual disturbance.  Gastrointestinal: Positive for abdominal distention.  Skin:       Hair loss  Neurological: Negative for tremors and weakness.    Medications: I have reviewed the patient's current medications.  Current Outpatient Medications  Medication Sig Dispense Refill  . Ascorbic Acid (VITAMIN C WITH ROSE HIPS) 500 MG tablet Take 500 mg by mouth daily.    Marland Kitchen b complex vitamins capsule Take 1 capsule by mouth daily.    . calcium carbonate (OS-CAL) 1250 (500 Ca) MG chewable tablet Chew 1 tablet by mouth daily.    . Cholecalciferol (VITAMIN D3) 125 MCG (5000 UT) CAPS Take by mouth.    Marland Kitchen glucosamine-chondroitin 500-400 MG tablet Take 1 tablet by mouth daily.    . Levomilnacipran HCl ER (FETZIMA) 40 MG CP24 Take 40 mg by mouth daily. 30 capsule 11  . tiZANidine (ZANAFLEX) 4 MG capsule Take 4 mg by mouth 3 (three) times daily.    . Zinc Acetate, Oral, (ZINC ACETATE PO) Take 1 tablet by mouth daily.     No current facility-administered medications for this visit.    Medication Side Effects: Other: possibly see above  Allergies:  Allergies  Allergen Reactions  . Aspartame And Phenylalanine Anaphylaxis  . Codeine     "Bleed internally"   . Sulfa Antibiotics Nausea And Vomiting    Past Medical History:  Diagnosis Date  . Anxiety   . Arthritis    2 herniated disc lower back  . Chronic lower back pain   . GERD (gastroesophageal reflux  disease)   . Sleep apnea    mild - does not use CPAP machine  . Smoker     Family History  Problem Relation Age of Onset  . Heart disease Mother   . Cancer Father   . Diabetes Maternal Grandmother   . Stroke Sister   . Heart disease Sister   . Breast cancer Neg Hx     Social History   Socioeconomic History  . Marital status: Single    Spouse name: Not on file  . Number of children: 1  . Years of education: BA  . Highest education level: Not on file  Occupational History  .  Not on file  Tobacco Use  . Smoking status: Former Smoker    Packs/day: 0.50    Years: 6.00    Pack years: 3.00    Types: Cigarettes  . Smokeless tobacco: Never Used  Substance and Sexual Activity  . Alcohol use: No    Alcohol/week: 0.0 standard drinks    Comment: Quits 1 month ago.  . Drug use: No  . Sexual activity: Not on file  Other Topics Concern  . Not on file  Social History Narrative   Drinks 2-3 cups of coffee a day    Social Determinants of Health   Financial Resource Strain:   . Difficulty of Paying Living Expenses: Not on file  Food Insecurity:   . Worried About Programme researcher, broadcasting/film/video in the Last Year: Not on file  . Ran Out of Food in the Last Year: Not on file  Transportation Needs:   . Lack of Transportation (Medical): Not on file  . Lack of Transportation (Non-Medical): Not on file  Physical Activity:   . Days of Exercise per Week: Not on file  . Minutes of Exercise per Session: Not on file  Stress:   . Feeling of Stress : Not on file  Social Connections:   . Frequency of Communication with Friends and Family: Not on file  . Frequency of Social Gatherings with Friends and Family: Not on file  . Attends Religious Services: Not on file  . Active Member of Clubs or Organizations: Not on file  . Attends Banker Meetings: Not on file  . Marital Status: Not on file  Intimate Partner Violence:   . Fear of Current or Ex-Partner: Not on file  . Emotionally Abused: Not on file  . Physically Abused: Not on file  . Sexually Abused: Not on file    Past Medical History, Surgical history, Social history, and Family history were reviewed and updated as appropriate.   Please see review of systems for further details on the patient's review from today.   Objective:   Physical Exam:  There were no vitals taken for this visit.  Physical Exam Constitutional:      General: She is not in acute distress.    Appearance: She is well-developed.   Musculoskeletal:        General: No deformity.  Neurological:     Mental Status: She is alert and oriented to person, place, and time.     Coordination: Coordination normal.  Psychiatric:        Attention and Perception: Attention and perception normal. She does not perceive auditory or visual hallucinations.        Mood and Affect: Mood is depressed. Mood is not anxious. Affect is not labile, blunt, angry or inappropriate.        Speech: Speech normal.  Behavior: Behavior normal.        Thought Content: Thought content normal. Thought content is not paranoid or delusional. Thought content does not include homicidal or suicidal ideation. Thought content does not include homicidal or suicidal plan.        Cognition and Memory: Cognition and memory normal.        Judgment: Judgment normal.     Comments: Insight fair. Situationally stressed chronically.  Animated style and somatic focus.     Lab Review:     Component Value Date/Time   NA 140 11/11/2015 1005   K 4.6 11/11/2015 1005   CL 102 11/11/2015 1005   CO2 25 11/11/2015 1005   GLUCOSE 93 11/11/2015 1005   BUN 12 11/11/2015 1005   CREATININE 0.71 11/11/2015 1005   CALCIUM 10.1 11/11/2015 1005   PROT 6.6 11/11/2015 1005   ALBUMIN 4.7 11/11/2015 1005   AST 17 11/11/2015 1005   ALT 34 (H) 11/11/2015 1005   ALKPHOS 44 11/11/2015 1005   BILITOT 0.2 11/11/2015 1005   GFRNONAA 96 11/11/2015 1005   GFRAA 110 11/11/2015 1005       Component Value Date/Time   WBC 5.7 11/11/2015 1005   WBC 5.8 09/13/2014 1205   RBC 4.60 11/11/2015 1005   RBC 4.37 09/13/2014 1205   HGB 15.0 11/11/2015 1005   HCT 44.1 11/11/2015 1005   PLT 313 11/11/2015 1005   MCV 96 11/11/2015 1005   MCH 32.6 11/11/2015 1005   MCH 33.9 09/13/2014 1205   MCHC 34.0 11/11/2015 1005   MCHC 35.1 09/13/2014 1205   RDW 12.2 (L) 11/11/2015 1005   LYMPHSABS 1.4 11/11/2015 1005   EOSABS 0.1 11/11/2015 1005   BASOSABS 0.0 11/11/2015 1005    No results  found for: POCLITH, LITHIUM   No results found for: PHENYTOIN, PHENOBARB, VALPROATE, CBMZ   [facility] testing Jan 31, 2018: SLC 6A4 intermediate response, HDR2A increased sensitivity to adverse effects, CYP enzymes as follows: 1A2 ultra-rapid metabolizer, 2 D6 intermediate metabolizer, U GT1A4 ultra-rapid, The other enzymes were normal.  Folic enzyme conversion was normal  .res Assessment: Plan:    Bobbi was seen today for follow-up and depression.  Diagnoses and all orders for this visit:  Major depressive disorder, recurrent episode, moderate (HCC) -     Levomilnacipran HCl ER (FETZIMA) 40 MG CP24; Take 40 mg by mouth daily.  PTSD (post-traumatic stress disorder)  Chronic pain syndrome -     Levomilnacipran HCl ER (FETZIMA) 40 MG CP24; Take 40 mg by mouth daily.    Disc SE Fetzima in detail related to her somatic complaints.  Overall she feels the Burr Medico has been a good medicine for her.  Is definitely helped with her chronic pain and she believes it is helped with depression as well.  Overall she is satisfied with the medications effectiveness.  Asks for note to have water at work bc dryness.    If necessary consider increase Fetzima or potentiate with Abilify.  She asks about Rexulti because of seeing commercials on TV.  However it would be necessary for her to try Abilify before insurance would cover Rexulti. Discussed potential metabolic side effects associated with atypical antipsychotics, as well as potential risk for movement side effects and explicitly discussed examples of tardive dyskinesia such as the lip puckering which she says she is already noticed.  As far as we know she is never taken an antipsychotic.. Advised pt to contact office if movement side effects occur.   Continue Fetzima  40 mg daily  Because of insurance limitations she wants to follow-up in a year as long as there is no worsening of symptoms.  If there is worsening symptoms she will notify us and we  will schedule an earlier appointment.  Meredith Staggers MD, DFAPA  Please see After Visit Summary for patient specific instructions.  No future appointments.  No orders of the defined types were placed in this encounter.   -------------------------------

## 2019-11-14 ENCOUNTER — Telehealth: Payer: Self-pay

## 2019-11-14 NOTE — Telephone Encounter (Signed)
Contacted [place] [employer] [phone] for a prior authorization on Fetzima ER 40 mg 1 daily #30 for 30 day. Approved effective 11/14/2019-11/08/2020 SK#[id number]  Patient Medicaid #[id number] M

## 2019-11-26 ENCOUNTER — Telehealth: Payer: Self-pay | Admitting: Psychiatry

## 2019-11-26 NOTE — Telephone Encounter (Signed)
Pt takes hydroxyzine 10mg , pt taking 1/4- 1/2 tab at night to help sleep. This was prescribed to her by other PCP. [facility] on .

## 2019-11-26 NOTE — Telephone Encounter (Signed)
Okay to send prescription hydroxyzine 10 mg 1/2-1 nightly as needed insomnia

## 2019-11-27 ENCOUNTER — Other Ambulatory Visit: Payer: Self-pay

## 2019-11-27 MED ORDER — HYDROXYZINE HCL 10 MG PO TABS: ORAL_TABLET | ORAL | 0 refills | Status: DC

## 2019-11-27 NOTE — Telephone Encounter (Signed)
RX sent

## 2020-02-24 ENCOUNTER — Other Ambulatory Visit: Payer: Self-pay | Admitting: Psychiatry

## 2020-03-11 ENCOUNTER — Telehealth: Payer: Self-pay

## 2020-03-11 NOTE — Telephone Encounter (Signed)
Patient called and left message requesting a rx for UTI. Per protocol patient needs to schedule a visit for a Ua and culture to be performed.  Called patient. No answer. Left message on VM for patient to return call to the office.

## 2020-03-12 ENCOUNTER — Other Ambulatory Visit: Payer: Self-pay | Admitting: Physician Assistant

## 2020-03-12 DIAGNOSIS — Z1231 Encounter for screening mammogram for malignant neoplasm of breast: Secondary | ICD-10-CM

## 2020-03-17 ENCOUNTER — Ambulatory Visit (INDEPENDENT_AMBULATORY_CARE_PROVIDER_SITE_OTHER): Payer: Medicaid Other | Admitting: *Deleted

## 2020-03-17 ENCOUNTER — Other Ambulatory Visit (HOSPITAL_COMMUNITY)
Admission: RE | Admit: 2020-03-17 | Discharge: 2020-03-17 | Disposition: A | Discharge status: Home / Self Care | Payer: Medicaid Other | Source: Ambulatory Visit | Attending: Obstetrics and Gynecology | Admitting: Obstetrics and Gynecology

## 2020-03-17 ENCOUNTER — Other Ambulatory Visit: Payer: Self-pay

## 2020-03-17 DIAGNOSIS — R399 Unspecified symptoms and signs involving the genitourinary system: Secondary | ICD-10-CM | POA: Diagnosis not present

## 2020-03-17 DIAGNOSIS — N949 Unspecified condition associated with female genital organs and menstrual cycle: Secondary | ICD-10-CM | POA: Insufficient documentation

## 2020-03-17 DIAGNOSIS — N9489 Other specified conditions associated with female genital organs and menstrual cycle: Secondary | ICD-10-CM

## 2020-03-17 LAB — POCT URINALYSIS DIPSTICK
Bilirubin, UA: NEGATIVE
Blood, UA: NEGATIVE
Glucose, UA: NEGATIVE
Ketones, UA: NEGATIVE
Leukocytes, UA: NEGATIVE
Nitrite, UA: NEGATIVE
Protein, UA: NEGATIVE
Spec Grav, UA: 1.01 (ref 1.010–1.025)
Urobilinogen, UA: 0.2 E.U./dL
pH, UA: 6 (ref 5.0–8.0)

## 2020-03-17 NOTE — Progress Notes (Signed)
Pt is in office with uti symptoms.  Pt states she has had symptoms and perineal burning/irritation for approx 2 weeks.  Pt states she has been taking Cystex OTC for symptoms. Udip in office today is negative.  Pt also states she will get bacterial infections.  Pt preformed self swab today to rule out BV/yeast.   Pt made aware she will be contacted with abnormal results this week.   Pt would like follow up if results are negative to discuss problems with provider.  Pt was advised to schedule f/u appt in 1 week with provider at check out today.

## 2020-03-17 NOTE — Progress Notes (Signed)
Patient was assessed and managed by nursing staff during this encounter. I have reviewed the chart and agree with the documentation and plan. I have also made any necessary editorial changes.  Catalina Antigua, MD 03/17/2020 11:38 AM

## 2020-03-18 LAB — CERVICOVAGINAL ANCILLARY ONLY
Bacterial Vaginitis (gardnerella): POSITIVE — AB
Candida Glabrata: NEGATIVE
Candida Vaginitis: NEGATIVE
Comment: NEGATIVE
Comment: NEGATIVE
Comment: NEGATIVE

## 2020-03-18 MED ORDER — METRONIDAZOLE 500 MG PO TABS: 500.0000 mg | ORAL_TABLET | Freq: Two times a day (BID) | ORAL | 0 refills | Status: DC

## 2020-03-18 NOTE — Addendum Note (Signed)
Addended by: Catalina Antigua on: 03/18/2020 01:44 PM   Modules accepted: Orders

## 2020-03-19 LAB — URINE CULTURE

## 2020-03-19 MED ORDER — NITROFURANTOIN MONOHYD MACRO 100 MG PO CAPS: 100.0000 mg | ORAL_CAPSULE | Freq: Two times a day (BID) | ORAL | 1 refills | Status: DC

## 2020-03-19 NOTE — Addendum Note (Signed)
Addended by: Catalina Antigua on: 03/19/2020 05:38 PM   Modules accepted: Orders

## 2020-03-24 ENCOUNTER — Other Ambulatory Visit: Payer: Self-pay

## 2020-03-24 ENCOUNTER — Encounter: Payer: Self-pay | Admitting: Obstetrics and Gynecology

## 2020-03-24 ENCOUNTER — Ambulatory Visit (INDEPENDENT_AMBULATORY_CARE_PROVIDER_SITE_OTHER): Payer: Medicaid Other | Admitting: Obstetrics and Gynecology

## 2020-03-24 DIAGNOSIS — N760 Acute vaginitis: Secondary | ICD-10-CM

## 2020-03-24 DIAGNOSIS — B9689 Other specified bacterial agents as the cause of diseases classified elsewhere: Secondary | ICD-10-CM | POA: Diagnosis not present

## 2020-03-24 DIAGNOSIS — R309 Painful micturition, unspecified: Secondary | ICD-10-CM

## 2020-03-24 MED ORDER — FLUCONAZOLE 150 MG PO TABS: 150.0000 mg | ORAL_TABLET | Freq: Once | ORAL | 3 refills | Status: AC

## 2020-03-24 NOTE — Progress Notes (Signed)
59 yo here for follow up on recent BV infection. Patient is currently on day 3 of Flagyl with some improvement in her symptoms. Patient continues to experience dysuria and bladder fullness. Patient denies any abnormal discharge. She is concerned about yeast infection and desires diflucan. Patient is also concerned that her urinary complaints are related to side effects from antidepressant Fetzima. Patient states that it took her a long time to find a medication that works for her therefore she is not ready to discontinue it. She is requesting urology referral  Past Medical History:  Diagnosis Date  . Anxiety   . Arthritis    2 herniated disc lower back  . Chronic lower back pain   . GERD (gastroesophageal reflux disease)   . Sleep apnea    mild - does not use CPAP machine  . Smoker    Past Surgical History:  Procedure Laterality Date  . Abalation     . BACK SURGERY     x 3 - branch block lower back injections -block nerve endings  . BARTHOLIN GLAND CYST EXCISION    . CESAREAN SECTION    . HYSTEROSCOPY N/A 09/13/2014   Procedure: HYSTEROSCOPY / REMOVAL OF INTRAUTERINE DEVICE;  Surgeon: Antionette Char, MD;  Location: [facility];  Service: Gynecology;  Laterality: N/A;  IUD removal  . NECK SURGERY     ? C3-4-5 with rods  . TONSILLECTOMY    . WISDOM TOOTH EXTRACTION     Family History  Problem Relation Age of Onset  . Heart disease Mother   . Cancer Father   . Diabetes Maternal Grandmother   . Stroke Sister   . Heart disease Sister   . Breast cancer Neg Hx    Social History   Tobacco Use  . Smoking status: Former Smoker    Packs/day: 0.50    Years: 6.00    Pack years: 3.00    Types: Cigarettes  . Smokeless tobacco: Never Used  Substance Use Topics  . Alcohol use: No    Alcohol/week: 0.0 standard drinks    Comment: Quits 1 month ago.  . Drug use: No   ROS See pertinent in HPI. All other symptoms reviewed and negative  GENERAL: Well-developed, well-nourished female in  no acute distress.  EXTREMITIES: No cyanosis, clubbing, or edema, 2+ distal pulses.  A/P 60 yo here for follow up - Complete course of Flagyl for BV - Rx diflucan provided - referral to urology placed - RTC prn

## 2020-04-14 ENCOUNTER — Other Ambulatory Visit: Payer: Self-pay

## 2020-04-14 ENCOUNTER — Ambulatory Visit
Admission: RE | Admit: 2020-04-14 | Discharge: 2020-04-14 | Disposition: A | Discharge status: Home / Self Care | Payer: Medicaid Other | Source: Ambulatory Visit | Attending: Physician Assistant | Admitting: Physician Assistant

## 2020-04-14 DIAGNOSIS — Z1231 Encounter for screening mammogram for malignant neoplasm of breast: Secondary | ICD-10-CM

## 2020-07-30 ENCOUNTER — Encounter: Payer: Self-pay | Admitting: Allergy and Immunology

## 2020-07-30 ENCOUNTER — Ambulatory Visit (INDEPENDENT_AMBULATORY_CARE_PROVIDER_SITE_OTHER): Payer: Medicaid Other | Admitting: Allergy and Immunology

## 2020-07-30 ENCOUNTER — Other Ambulatory Visit: Payer: Self-pay

## 2020-07-30 VITALS — BP 136/80 | HR 87 | Temp 97.8°F | Resp 20 | Ht 63.0 in | Wt 127.6 lb

## 2020-07-30 DIAGNOSIS — B370 Candidal stomatitis: Secondary | ICD-10-CM

## 2020-07-30 DIAGNOSIS — B999 Unspecified infectious disease: Secondary | ICD-10-CM | POA: Diagnosis not present

## 2020-07-30 MED ORDER — CLOTRIMAZOLE 10 MG MT TROC
OROMUCOSAL | 0 refills | Status: DC
Start: 2020-07-30 — End: 2021-01-28

## 2020-07-30 NOTE — Assessment & Plan Note (Signed)
   A prescription has been provided for clotrimazole 10 mg troches: Slowly dissolve in the mouth 5 times per day for 10 days, not to be chewed or swallowed whole.

## 2020-07-30 NOTE — Progress Notes (Signed)
New Patient Note  RE: [name] MRN: [id number] DOB: September 19, 1961 Date of Office Visit: 07/30/2020  Referring provider: Merri Brunette, MD Primary care provider: Merri Brunette, MD  Chief Complaint: Immunodeficiency   History of present illness: Felicia Wilcox is a 59 y.o. female seen today in consultation requested by Merri Brunette, MD.  She reports that she has recurrent candidal vulvovaginitis and A prescription has been provided for clotrimazole 10 mg troches: Slowly dissolve in the mouth 5 times per day for 10 days, not to be chewed or swallowed whole., As well as "constant ear infections" frequent painful cervical lymph nodes, and "nail fungus constantly." She states that she developed dengue fever approximately 20 years ago despite the fact that no one else in her group had dengue fever. She also complains that despite having received the flu vaccine, she got the flu a month or 2 later and despite having received vaccination for COVID-19, she got COVID-19 2 or 3 months later. She is curious if she may have an underlying immunodeficiency. She denies nasal and sinus symptoms and states that she has never had environmental allergies despite frequent exposure to environmental allergens. She also complains of persistent fatigue.  Assessment and plan: Recurrent infections Immunocompetence will be assessed with labs.  The following labs have been ordered: CBC with differential, IgG, IgA and IgM, tetanus IgG titers, and pneumococcal IgG titers.  If pre-vaccination titers are low, post-vaccination titers will be drawn to assess response.    The patient will be called with further recommendations and follow-up instructions once the labs have returned.  Oral candidiasis  A prescription has been provided for clotrimazole 10 mg troches: Slowly dissolve in the mouth 5 times per day for 10 days, not to be chewed or swallowed whole.  Diagnostics: Allergy skin testing: Negative to Candida  despite a positive histamine control.  Physical examination: Blood pressure 136/80, pulse 87, temperature 97.8 F (36.6 C), temperature source Tympanic, resp. rate 20, height 5\' 3"  (1.6 m), weight 127 lb 9.6 oz (57.9 kg), SpO2 100 %.  General: Alert, interactive, in no acute distress. HEENT: TMs pearly gray, turbinates minimally edematous without discharge, post-pharynx moderately erythematous. White/gray patches on periphery of the tongue. Neck: Supple without lymphadenopathy. Lungs: Clear to auscultation without wheezing, rhonchi or rales. CV: Normal S1, S2 without murmurs. Abdomen: Nondistended, nontender. Skin: Warm and dry, without lesions or rashes. Extremities:  No clubbing, cyanosis or edema. Neuro:   Grossly intact.  Review of systems:  Review of systems negative except as noted in HPI / PMHx or noted below: Review of Systems  Constitutional: Negative.   HENT: Negative.   Eyes: Negative.   Respiratory: Negative.   Cardiovascular: Negative.   Gastrointestinal: Negative.   Genitourinary: Negative.   Musculoskeletal: Negative.   Skin: Negative.   Neurological: Negative.   Endo/Heme/Allergies: Negative.   Psychiatric/Behavioral: Negative.     Past medical history:  Past Medical History:  Diagnosis Date  . Anxiety   . Arthritis    2 herniated disc lower back  . Chronic lower back pain   . GERD (gastroesophageal reflux disease)   . Sleep apnea    mild - does not use CPAP machine  . Smoker     Past surgical history:  Past Surgical History:  Procedure Laterality Date  . Abalation     . BACK SURGERY     x 3 - branch block lower back injections -block nerve endings  . BARTHOLIN GLAND CYST EXCISION    .  CESAREAN SECTION    . HYSTEROSCOPY N/A 09/13/2014   Procedure: HYSTEROSCOPY / REMOVAL OF INTRAUTERINE DEVICE;  Surgeon: Antionette Char, MD;  Location: [facility];  Service: Gynecology;  Laterality: N/A;  IUD removal  . NECK SURGERY     ? C3-4-5 with rods  .  TONSILLECTOMY    . WISDOM TOOTH EXTRACTION      Family history: Family History  Problem Relation Age of Onset  . Heart disease Mother   . Cancer Father   . Diabetes Maternal Grandmother   . Stroke Sister   . Heart disease Sister   . Breast cancer Neg Hx     Social history: Social History   Socioeconomic History  . Marital status: Single    Spouse name: Not on file  . Number of children: 1  . Years of education: BA  . Highest education level: Not on file  Occupational History  . Not on file  Tobacco Use  . Smoking status: Former Smoker    Packs/day: 0.50    Years: 6.00    Pack years: 3.00    Types: Cigarettes  . Smokeless tobacco: Never Used  Vaping Use  . Vaping Use: Former  . Quit date: 12/28/2017  Substance and Sexual Activity  . Alcohol use: No    Alcohol/week: 0.0 standard drinks    Comment: Quits 1 month ago.  . Drug use: No  . Sexual activity: Not on file  Other Topics Concern  . Not on file  Social History Narrative   Drinks 2-3 cups of coffee a day    Social Determinants of Health   Financial Resource Strain:   . Difficulty of Paying Living Expenses: Not on file  Food Insecurity:   . Worried About Programme researcher, broadcasting/film/video in the Last Year: Not on file  . Ran Out of Food in the Last Year: Not on file  Transportation Needs:   . Lack of Transportation (Medical): Not on file  . Lack of Transportation (Non-Medical): Not on file  Physical Activity:   . Days of Exercise per Week: Not on file  . Minutes of Exercise per Session: Not on file  Stress:   . Feeling of Stress : Not on file  Social Connections:   . Frequency of Communication with Friends and Family: Not on file  . Frequency of Social Gatherings with Friends and Family: Not on file  . Attends Religious Services: Not on file  . Active Member of Clubs or Organizations: Not on file  . Attends Banker Meetings: Not on file  . Marital Status: Not on file  Intimate Partner Violence:   .  Fear of Current or Ex-Partner: Not on file  . Emotionally Abused: Not on file  . Physically Abused: Not on file  . Sexually Abused: Not on file    Environmental History: The patient lives in a house built in the 44s with hardwood floors throughout and central air/heat.  There is mold/water damage in the home.  There are no pets in the home.  She is an occasional smoker.  Current Outpatient Medications  Medication Sig Dispense Refill  . calcium carbonate (OS-CAL) 1250 (500 Ca) MG chewable tablet Chew 1 tablet by mouth daily.    Marland Kitchen glucosamine-chondroitin 500-400 MG tablet Take 1 tablet by mouth daily.    . hydrOXYzine (ATARAX/VISTARIL) 10 MG tablet TAKE 1/2 TO 1 (ONE-HALF TO ONE) TABLET BY MOUTH AT BEDTIME AS NEEDED FOR INSOMNIA 30 tablet 0  . Levomilnacipran HCl  ER (FETZIMA) 40 MG CP24 Take 40 mg by mouth daily. 30 capsule 11  . mirtazapine (REMERON) 15 MG tablet Take 7.5-15 mg by mouth daily.    . Misc Natural Products (HOT FLASHEX) 70-50-60 MG TABS Take by mouth.    . Misc. Devices MISC 1500 mg CBD oil Directions: One half to one dropper at bedtime. For daily use.   This does contain delta-9 THC which is not psychoactive.  This may show a false-positive on a rapid urine drug screen. But with mass spectrometry testing it does show CBD only.  No psychoactive THC.    . Misc. Devices MISC Metrix test strips use as directed 1-2 times daily    . pyridoxine (B-6) 100 MG tablet Take by mouth.    . Ascorbic Acid (VITAMIN C WITH ROSE HIPS) 500 MG tablet Take 500 mg by mouth daily. (Patient not taking: Reported on 07/30/2020)    . b complex vitamins capsule Take 1 capsule by mouth daily. (Patient not taking: Reported on 07/30/2020)    . BIOTIN MAXIMUM PO Take by mouth. (Patient not taking: Reported on 07/30/2020)    . Cholecalciferol (VITAMIN D3) 125 MCG (5000 UT) CAPS Take by mouth. (Patient not taking: Reported on 07/30/2020)    . Cholecalciferol 100 MCG (4000 UT) CAPS Take by mouth. (Patient not  taking: Reported on 07/30/2020)    . Multiple Vitamin (MULTIVITAMIN) capsule Take 1 capsule by mouth daily. (Patient not taking: Reported on 07/30/2020)    . tiZANidine (ZANAFLEX) 4 MG capsule Take 4 mg by mouth 3 (three) times daily. (Patient not taking: Reported on 03/24/2020)    . Zinc 30 MG CAPS Take by mouth. (Patient not taking: Reported on 07/30/2020)     No current facility-administered medications for this visit.    Known medication allergies: Allergies  Allergen Reactions  . Aspartame And Phenylalanine Anaphylaxis  . Codeine     "Bleed internally"   . Sulfa Antibiotics Nausea And Vomiting    I appreciate the opportunity to take part in Anesia's care. Please do not hesitate to contact me with questions.  Sincerely,   R. Jorene Guest, MD

## 2020-07-30 NOTE — Patient Instructions (Addendum)
Recurrent infections Immunocompetence will be assessed with labs.  The following labs have been ordered: CBC with differential, IgG, IgA and IgM, tetanus IgG titers, and pneumococcal IgG titers.  If pre-vaccination titers are low, post-vaccination titers will be drawn to assess response.    The patient will be called with further recommendations and follow-up instructions once the labs have returned.  Oral candidiasis  A prescription has been provided for clotrimazole 10 mg troches: Slowly dissolve in the mouth 5 times per day for 10 days, not to be chewed or swallowed whole.  When lab results have returned you will be called with further recommendations. With the newly implemented Cures Act, the labs may be visible to you at the same time they become visible to [place]. However, the results will typically not be addressed until all of the results are back, so please be patient.  Until you have heard from [place], please continue the treatment plan as outlined on your take home sheet.

## 2020-07-30 NOTE — Assessment & Plan Note (Signed)
   Immunocompetence will be assessed with labs.  The following labs have been ordered: CBC with differential, IgG, IgA and IgM, tetanus IgG titers, and pneumococcal IgG titers.  If pre-vaccination titers are low, post-vaccination titers will be drawn to assess response.    The patient will be called with further recommendations and follow-up instructions once the labs have returned. 

## 2020-08-08 ENCOUNTER — Telehealth: Payer: Self-pay | Admitting: Allergy and Immunology

## 2020-08-08 LAB — IGG, IGA, IGM
IgA/Immunoglobulin A, Serum: 132 mg/dL (ref 87–352)
IgG (Immunoglobin G), Serum: 772 mg/dL (ref 586–1602)
IgM (Immunoglobulin M), Srm: 143 mg/dL (ref 26–217)

## 2020-08-08 LAB — CBC WITH DIFFERENTIAL/PLATELET
Basophils Absolute: 0.1 10*3/uL (ref 0.0–0.2)
Basos: 1 %
EOS (ABSOLUTE): 0.1 10*3/uL (ref 0.0–0.4)
Eos: 2 %
Hematocrit: 51.2 % — ABNORMAL HIGH (ref 34.0–46.6)
Hemoglobin: 17.2 g/dL — ABNORMAL HIGH (ref 11.1–15.9)
Immature Grans (Abs): 0 10*3/uL (ref 0.0–0.1)
Immature Granulocytes: 0 %
Lymphocytes Absolute: 1.9 10*3/uL (ref 0.7–3.1)
Lymphs: 25 %
MCH: 31.2 pg (ref 26.6–33.0)
MCHC: 33.6 g/dL (ref 31.5–35.7)
MCV: 93 fL (ref 79–97)
Monocytes Absolute: 0.4 10*3/uL (ref 0.1–0.9)
Monocytes: 5 %
Neutrophils Absolute: 5.2 10*3/uL (ref 1.4–7.0)
Neutrophils: 67 %
Platelets: 337 10*3/uL (ref 150–450)
RBC: 5.52 x10E6/uL — ABNORMAL HIGH (ref 3.77–5.28)
RDW: 12.2 % (ref 11.7–15.4)
WBC: 7.7 10*3/uL (ref 3.4–10.8)

## 2020-08-08 LAB — STREP PNEUMONIAE 23 SEROTYPES IGG
Pneumo Ab Type 1*: 1.3 ug/mL — ABNORMAL LOW (ref >1.3)
Pneumo Ab Type 12 (12F)*: <0.1 ug/mL — ABNORMAL LOW (ref >1.3)
Pneumo Ab Type 14*: 8.9 ug/mL (ref >1.3)
Pneumo Ab Type 17 (17F)*: 2.7 ug/mL (ref >1.3)
Pneumo Ab Type 19 (19F)*: 2.8 ug/mL (ref >1.3)
Pneumo Ab Type 2*: 1.9 ug/mL (ref >1.3)
Pneumo Ab Type 20*: 3.3 ug/mL (ref >1.3)
Pneumo Ab Type 22 (22F)*: 0.9 ug/mL — ABNORMAL LOW (ref >1.3)
Pneumo Ab Type 23 (23F)*: 0.6 ug/mL — ABNORMAL LOW (ref >1.3)
Pneumo Ab Type 26 (6B)*: 16.3 ug/mL (ref >1.3)
Pneumo Ab Type 3*: 4.2 ug/mL (ref >1.3)
Pneumo Ab Type 34 (10A)*: 0.8 ug/mL — ABNORMAL LOW (ref >1.3)
Pneumo Ab Type 4*: 0.8 ug/mL — ABNORMAL LOW (ref >1.3)
Pneumo Ab Type 43 (11A)*: 1.8 ug/mL (ref >1.3)
Pneumo Ab Type 5*: 0.5 ug/mL — ABNORMAL LOW (ref >1.3)
Pneumo Ab Type 51 (7F)*: 8.3 ug/mL (ref >1.3)
Pneumo Ab Type 54 (15B)*: 4.9 ug/mL (ref >1.3)
Pneumo Ab Type 56 (18C)*: 2.1 ug/mL (ref >1.3)
Pneumo Ab Type 57 (19A)*: >33.6 ug/mL (ref >1.3)
Pneumo Ab Type 68 (9V)*: 3.4 ug/mL (ref >1.3)
Pneumo Ab Type 70 (33F)*: 0.8 ug/mL — ABNORMAL LOW (ref >1.3)
Pneumo Ab Type 8*: 6.8 ug/mL (ref >1.3)
Pneumo Ab Type 9 (9N)*: 0.5 ug/mL — ABNORMAL LOW (ref >1.3)

## 2020-08-08 LAB — TETANUS ANTIBODY, IGG: Tetanus Ab, IgG: 3.27 IU/mL (ref <0.10)

## 2020-08-08 NOTE — Telephone Encounter (Signed)
PT came in office to get a copy of her lab results. PT filled out consent form for lab requested at 11/3 visit. Advise to patient that these test results were put in our system today (11/12) and we cannot release the records until Dr Nunzio Cobbs can review and dictate the results. PT states she was given a call yesterday that says labs came back normal. Advise again that the results did not come back to [place] until today but they may be available to view on mychart. Sending signed consent form to [place]. Please mail out records after Dr Maren Reamer review.

## 2020-08-08 NOTE — Telephone Encounter (Signed)
Please advise concerning labs results.

## 2020-08-11 NOTE — Telephone Encounter (Signed)
See result note.  

## 2020-08-14 ENCOUNTER — Telehealth: Payer: Self-pay | Admitting: Allergy and Immunology

## 2020-08-14 NOTE — Telephone Encounter (Signed)
Pt. Awaiting test results, needs result before Dr's appt today

## 2020-08-14 NOTE — Telephone Encounter (Signed)
Pt informed of results and sent results to pcp

## 2020-08-26 ENCOUNTER — Telehealth: Payer: Self-pay

## 2020-08-26 DIAGNOSIS — B370 Candidal stomatitis: Secondary | ICD-10-CM

## 2020-08-26 MED ORDER — NYSTATIN 100000 UNIT/ML MT SUSP: OROMUCOSAL | 0 refills | Status: DC

## 2020-08-26 NOTE — Telephone Encounter (Signed)
Lab results sent via Mychart to patient by LF.

## 2020-08-26 NOTE — Telephone Encounter (Signed)
Please call in Nystatin, swish and swallow 5 mL once every 6 hours for 7 days. Thanks.

## 2020-08-26 NOTE — Telephone Encounter (Signed)
Pt. Was seen back on November 3rd stating she finished all her clotrimazole 10 mg troches 5 times per day for 10 days for her thrush.  Pt. States no change. Thrush still present. Please advise.

## 2020-08-26 NOTE — Telephone Encounter (Signed)
Called pt. To let her know that we sent in nystatin suspension take 5 ml swish and swallow 4 times a day for 7 days. Pt. States that didn't help either. I told her to try it again and it that doesn't help return call to the office.

## 2020-08-29 NOTE — Telephone Encounter (Signed)
Felicia Wilcox from [facility] called to confirm dosage for swish and swallow, I relayed what was noted by Elon Jester on 08/26/20.take 20ml swish and swallow 4 times a day for 7 days

## 2020-11-03 ENCOUNTER — Other Ambulatory Visit: Payer: Self-pay | Admitting: Psychiatry

## 2020-11-03 NOTE — Telephone Encounter (Signed)
She's medicaid pt not seen in a year.  Does she need a new provider ?

## 2020-11-05 NOTE — Telephone Encounter (Signed)
She's medicaid pt not seen in a year.  Does she need a new provider ? 

## 2020-11-06 NOTE — Telephone Encounter (Signed)
She has always seen you. With medicaid she pays out of pocket.

## 2020-11-07 ENCOUNTER — Other Ambulatory Visit: Payer: Self-pay | Admitting: Internal Medicine

## 2020-11-07 DIAGNOSIS — R7303 Prediabetes: Secondary | ICD-10-CM

## 2020-11-21 ENCOUNTER — Ambulatory Visit
Admission: RE | Admit: 2020-11-21 | Discharge: 2020-11-21 | Disposition: A | Discharge status: Home / Self Care | Payer: No Typology Code available for payment source | Source: Ambulatory Visit | Attending: Internal Medicine | Admitting: Internal Medicine

## 2020-11-21 DIAGNOSIS — R7303 Prediabetes: Secondary | ICD-10-CM

## 2020-11-28 ENCOUNTER — Other Ambulatory Visit: Payer: Self-pay | Admitting: Psychiatry

## 2020-11-28 DIAGNOSIS — F331 Major depressive disorder, recurrent, moderate: Secondary | ICD-10-CM

## 2020-11-28 DIAGNOSIS — G894 Chronic pain syndrome: Secondary | ICD-10-CM

## 2020-12-01 ENCOUNTER — Other Ambulatory Visit: Payer: Self-pay | Admitting: Psychiatry

## 2020-12-01 DIAGNOSIS — G894 Chronic pain syndrome: Secondary | ICD-10-CM

## 2020-12-01 DIAGNOSIS — F331 Major depressive disorder, recurrent, moderate: Secondary | ICD-10-CM

## 2020-12-01 NOTE — Telephone Encounter (Signed)
Noted I did receive a PA for her today

## 2020-12-01 NOTE — Telephone Encounter (Signed)
She has failed multiple alternatives which should be noted in the chart.  She pays out-of-pocket and does not come very often.  She may be overdue for an appointment but if he could do the PA I would appreciate it

## 2020-12-02 ENCOUNTER — Telehealth: Payer: Self-pay

## 2020-12-02 NOTE — Telephone Encounter (Signed)
Her PA is approved. She can get up to a 90 day if she prefers.

## 2020-12-02 NOTE — Telephone Encounter (Signed)
PA is pending with [employer]/Medicaid.   Correct she has not been in since 10/2019

## 2020-12-02 NOTE — Telephone Encounter (Signed)
Prior Authorization submitted and approved for [facility] ER CAPSULE, effective 11/28/2020 until further notice. #30/30 at retail or patient can choose #90/90 at mail order.   [employer]/medicaid  ID# [id number]

## 2020-12-02 NOTE — Telephone Encounter (Signed)
Clarification if she does 90 day it has to be mail order

## 2020-12-02 NOTE — Telephone Encounter (Signed)
Okay 90-day refill with no additional refills

## 2020-12-05 NOTE — Telephone Encounter (Signed)
Pt confirmed she would like it sent to her [facility] pharmacy on file.

## 2020-12-05 NOTE — Telephone Encounter (Signed)
Felicia Wilcox, can you check with patient about her refill. Inform her I did get a prior approval. Does she want Rx sent to her local pharmacy or does she use mail order?

## 2021-01-07 ENCOUNTER — Telehealth: Payer: Self-pay

## 2021-01-28 ENCOUNTER — Ambulatory Visit (INDEPENDENT_AMBULATORY_CARE_PROVIDER_SITE_OTHER): Payer: Medicaid Other | Admitting: Obstetrics

## 2021-01-28 ENCOUNTER — Other Ambulatory Visit: Payer: Self-pay

## 2021-01-28 ENCOUNTER — Encounter: Payer: Self-pay | Admitting: Obstetrics

## 2021-01-28 ENCOUNTER — Other Ambulatory Visit (HOSPITAL_COMMUNITY)
Admission: RE | Admit: 2021-01-28 | Discharge: 2021-01-28 | Disposition: A | Discharge status: Home / Self Care | Payer: Medicaid Other | Source: Ambulatory Visit | Attending: Obstetrics | Admitting: Obstetrics

## 2021-01-28 VITALS — BP 146/94 | HR 94 | Ht 63.0 in | Wt 125.4 lb

## 2021-01-28 DIAGNOSIS — B9689 Other specified bacterial agents as the cause of diseases classified elsewhere: Secondary | ICD-10-CM

## 2021-01-28 DIAGNOSIS — N760 Acute vaginitis: Secondary | ICD-10-CM

## 2021-01-28 DIAGNOSIS — B373 Candidiasis of vulva and vagina: Secondary | ICD-10-CM | POA: Diagnosis not present

## 2021-01-28 DIAGNOSIS — Z01419 Encounter for gynecological examination (general) (routine) without abnormal findings: Secondary | ICD-10-CM | POA: Diagnosis not present

## 2021-01-28 DIAGNOSIS — B3731 Acute candidiasis of vulva and vagina: Secondary | ICD-10-CM

## 2021-01-28 MED ORDER — METRONIDAZOLE 0.75 % VA GEL: 1.0000 | Freq: Two times a day (BID) | VAGINAL | 5 refills | Status: DC

## 2021-01-28 MED ORDER — TINIDAZOLE 500 MG PO TABS: 1000.0000 mg | ORAL_TABLET | Freq: Every day | ORAL | 4 refills | Status: DC

## 2021-01-28 MED ORDER — FLUCONAZOLE 150 MG PO TABS: 150.0000 mg | ORAL_TABLET | Freq: Once | ORAL | 0 refills | Status: AC

## 2021-01-28 NOTE — Progress Notes (Signed)
Subjective:        Felicia Wilcox is a 60 y.o. female here for a routine exam.  Current complaints: None.    Personal health questionnaire:  Is patient Ashkenazi Jewish, have a family history of breast and/or ovarian cancer: no Is there a family history of uterine cancer diagnosed at age < 59, gastrointestinal cancer, urinary tract cancer, family member who is a Personnel officer syndrome-associated carrier: no Is the patient overweight and hypertensive, family history of diabetes, personal history of gestational diabetes, preeclampsia or PCOS: no Is patient over 52, have PCOS,  family history of premature CHD under age 45, diabetes, smoke, have hypertension or peripheral artery disease:  no At any time, has a partner hit, kicked or otherwise hurt or frightened you?: no Over the past 2 weeks, have you felt down, depressed or hopeless?: no Over the past 2 weeks, have you felt little interest or pleasure in doing things?:no   Gynecologic History No LMP recorded. Patient is postmenopausal. Contraception: post menopausal status Last Pap: 07-31-2019. Results were: normal Last mammogram: 04-14-2020. Results were: normal  Obstetric History OB History  Gravida Para Term Preterm AB Living  1         1  SAB IAB Ectopic Multiple Live Births          1    # Outcome Date GA Lbr Len/2nd Weight Sex Delivery Anes PTL Lv  1 Gravida 07/05/91    M CS-LTranv EPI  LIV    Past Medical History:  Diagnosis Date  . Anxiety   . Arthritis    2 herniated disc lower back  . Chronic lower back pain   . GERD (gastroesophageal reflux disease)   . Sleep apnea    mild - does not use CPAP machine  . Smoker     Past Surgical History:  Procedure Laterality Date  . Abalation     . BACK SURGERY     x 3 - branch block lower back injections -block nerve endings  . BARTHOLIN GLAND CYST EXCISION    . CESAREAN SECTION    . HYSTEROSCOPY N/A 09/13/2014   Procedure: HYSTEROSCOPY / REMOVAL OF INTRAUTERINE DEVICE;   Surgeon: Antionette Char, MD;  Location: [facility];  Service: Gynecology;  Laterality: N/A;  IUD removal  . NECK SURGERY     ? C3-4-5 with rods  . TONSILLECTOMY    . WISDOM TOOTH EXTRACTION       Current Outpatient Medications:  .  amLODipine (NORVASC) 2.5 MG tablet, Take by mouth., Disp: , Rfl:  .  Ascorbic Acid (VITAMIN C WITH ROSE HIPS) 500 MG tablet, Take 500 mg by mouth daily., Disp: , Rfl:  .  BIOTIN MAXIMUM PO, Take by mouth., Disp: , Rfl:  .  calcium carbonate (OS-CAL) 1250 (500 Ca) MG chewable tablet, Chew 1 tablet by mouth daily., Disp: , Rfl:  .  Cholecalciferol (VITAMIN D3) 125 MCG (5000 UT) CAPS, Take by mouth., Disp: , Rfl:  .  Cholecalciferol 100 MCG (4000 UT) CAPS, Take by mouth., Disp: , Rfl:  .  FETZIMA 40 MG CP24, TAKE 1 CAPSULE BY MOUTH ONCE DAILY, Disp: 30 capsule, Rfl: 2 .  glucosamine-chondroitin 500-400 MG tablet, Take 1 tablet by mouth daily., Disp: , Rfl:  .  metroNIDAZOLE (METROGEL VAGINAL) 0.75 % vaginal gel, Place 1 Applicatorful vaginally 2 (two) times daily., Disp: 70 g, Rfl: 5 .  Misc Natural Products (HOT FLASHEX) 70-50-60 MG TABS, Take by mouth., Disp: , Rfl:  .  Multiple Vitamin (MULTIVITAMIN) capsule, Take 1 capsule by mouth daily., Disp: , Rfl:  .  pyridoxine (B-6) 100 MG tablet, Take by mouth., Disp: , Rfl:  .  tinidazole (TINDAMAX) 500 MG tablet, Take 2 tablets (1,000 mg total) by mouth daily with breakfast., Disp: 10 tablet, Rfl: 4 .  Zinc 30 MG CAPS, Take by mouth., Disp: , Rfl:  .  b complex vitamins capsule, Take 1 capsule by mouth daily. (Patient not taking: Reported on 01/28/2021), Disp: , Rfl:  .  mirtazapine (REMERON) 15 MG tablet, Take 7.5-15 mg by mouth daily. (Patient not taking: Reported on 01/28/2021), Disp: , Rfl:  .  Misc. Devices MISC, 1500 mg CBD oil Directions: One half to one dropper at bedtime. For daily use.   This does contain delta-9 THC which is not psychoactive.  This may show a false-positive on a rapid urine drug screen. But with  mass spectrometry testing it does show CBD only.  No psychoactive THC. (Patient not taking: Reported on 01/28/2021), Disp: , Rfl:  .  Misc. Devices MISC, Metrix test strips use as directed 1-2 times daily (Patient not taking: Reported on 01/28/2021), Disp: , Rfl:  Allergies  Allergen Reactions  . Aspartame And Phenylalanine Anaphylaxis  . Codeine     "Bleed internally"   . Sulfa Antibiotics Nausea And Vomiting    Social History   Tobacco Use  . Smoking status: Former Smoker    Packs/day: 0.50    Years: 6.00    Pack years: 3.00    Types: Cigarettes  . Smokeless tobacco: Never Used  Substance Use Topics  . Alcohol use: No    Alcohol/week: 0.0 standard drinks    Comment: Quits 1 month ago.    Family History  Problem Relation Age of Onset  . Heart disease Mother   . Cancer Father   . Diabetes Maternal Grandmother   . Stroke Sister   . Heart disease Sister   . Breast cancer Neg Hx       Review of Systems  Constitutional: negative for fatigue and weight loss Respiratory: negative for cough and wheezing Cardiovascular: negative for chest pain, fatigue and palpitations Gastrointestinal: negative for abdominal pain and change in bowel habits Musculoskeletal:negative for myalgias Neurological: negative for gait problems and tremors Behavioral/Psych: negative for abusive relationship, depression Endocrine: negative for temperature intolerance    Genitourinary:negative for abnormal menstrual periods, genital lesions, hot flashes, sexual problems and vaginal discharge Integument/breast: negative for breast lump, breast tenderness, nipple discharge and skin lesion(s)    Objective:       BP (!) 146/94   Pulse 94   Ht 5\' 3"  (1.6 m)   Wt 125 lb 6.4 oz (56.9 kg)   BMI 22.21 kg/m  General:   alert  Skin:   no rash or abnormalities  Lungs:   clear to auscultation bilaterally  Heart:   regular rate and rhythm, S1, S2 normal, no murmur, click, rub or gallop  Breasts:   normal  without suspicious masses, skin or nipple changes or axillary nodes  Abdomen:  normal findings: no organomegaly, soft, non-tender and no hernia  Pelvis:  External genitalia: normal general appearance Urinary system: urethral meatus normal and bladder without fullness, nontender Vaginal: normal without tenderness, induration or masses Cervix: normal appearance Adnexa: normal bimanual exam Uterus: anteverted and non-tender, normal size   Lab Review Urine pregnancy test Labs reviewed yes Radiologic studies reviewed yes  I have spent a total of 20 minutes of face-to-face time, excluding clinical  staff time, reviewing notes and preparing to see patient, ordering tests and/or medications, and counseling the patient.  Assessment:     1. Encounter for gynecological examination with Papanicolaou smear of cervix Rx: - Cytology - PAP( [place])  2. BV (bacterial vaginosis) Rx: - tinidazole (TINDAMAX) 500 MG tablet; Take 2 tablets (1,000 mg total) by mouth daily with breakfast.  Dispense: 10 tablet; Refill: 4 - metroNIDAZOLE (METROGEL VAGINAL) 0.75 % vaginal gel; Place 1 Applicatorful vaginally 2 (two) times daily.  Dispense: 70 g; Refill: 5  3. Candida vaginitis Rx: - fluconazole (DIFLUCAN) 150 MG tablet; Take 1 tablet (150 mg total) by mouth once for 1 dose.  Dispense: 1 tablet; Refill: 0     Plan:    Education reviewed: calcium supplements, depression evaluation, low fat, low cholesterol diet, safe sex/STD prevention, self breast exams, skin cancer screening and weight bearing exercise. Follow up in: 1 year.   Meds ordered this encounter  Medications  . fluconazole (DIFLUCAN) 150 MG tablet    Sig: Take 1 tablet (150 mg total) by mouth once for 1 dose.    Dispense:  1 tablet    Refill:  0  . tinidazole (TINDAMAX) 500 MG tablet    Sig: Take 2 tablets (1,000 mg total) by mouth daily with breakfast.    Dispense:  10 tablet    Refill:  4  . metroNIDAZOLE (METROGEL VAGINAL) 0.75 %  vaginal gel    Sig: Place 1 Applicatorful vaginally 2 (two) times daily.    Dispense:  70 g    Refill:  5     I have spent a total of 20 minutes of face-to-face and non-face-to-face time, excluding clinical staff time, reviewing notes and preparing to see patient, ordering tests and/or medications, and counseling the patient.   Brock Bad, MD 01/29/2021 6:33 AM

## 2021-01-28 NOTE — Progress Notes (Signed)
Patient presents for AEX. Patient has no concerns today. She would like to discuss having a rx on file for bv.  Last Pap: 08/27/2019 Negative Last MM: 03/2020 Normal

## 2021-02-02 LAB — CYTOLOGY - PAP
Comment: NEGATIVE
Diagnosis: NEGATIVE
High risk HPV: NEGATIVE

## 2021-03-06 ENCOUNTER — Telehealth: Payer: Self-pay | Admitting: Psychiatry

## 2021-03-06 ENCOUNTER — Other Ambulatory Visit: Payer: Self-pay | Admitting: Psychiatry

## 2021-03-06 DIAGNOSIS — F331 Major depressive disorder, recurrent, moderate: Secondary | ICD-10-CM

## 2021-03-06 DIAGNOSIS — G894 Chronic pain syndrome: Secondary | ICD-10-CM

## 2021-03-06 NOTE — Telephone Encounter (Signed)
Not sure if this a controlled substance

## 2021-03-06 NOTE — Telephone Encounter (Signed)
Pt would like to change pharmacy's for her refill of Fetzima. [facility] [address] . Pt made appt for 05/26/21

## 2021-03-06 NOTE — Telephone Encounter (Signed)
pended

## 2021-03-13 ENCOUNTER — Other Ambulatory Visit: Payer: Self-pay | Admitting: Internal Medicine

## 2021-03-13 DIAGNOSIS — Z1231 Encounter for screening mammogram for malignant neoplasm of breast: Secondary | ICD-10-CM

## 2021-05-07 ENCOUNTER — Ambulatory Visit: Payer: Medicaid Other

## 2021-05-22 ENCOUNTER — Other Ambulatory Visit: Payer: Self-pay

## 2021-05-22 ENCOUNTER — Encounter: Payer: Self-pay | Admitting: Psychiatry

## 2021-05-22 ENCOUNTER — Ambulatory Visit (INDEPENDENT_AMBULATORY_CARE_PROVIDER_SITE_OTHER): Payer: Medicare Other | Admitting: Psychiatry

## 2021-05-22 DIAGNOSIS — F331 Major depressive disorder, recurrent, moderate: Secondary | ICD-10-CM

## 2021-05-22 DIAGNOSIS — F4310 Post-traumatic stress disorder, unspecified: Secondary | ICD-10-CM

## 2021-05-22 DIAGNOSIS — G894 Chronic pain syndrome: Secondary | ICD-10-CM

## 2021-05-22 MED ORDER — HYDROXYZINE HCL 10 MG PO TABS: 10.0000 mg | ORAL_TABLET | Freq: Three times a day (TID) | ORAL | 1 refills | Status: DC | PRN

## 2021-05-22 MED ORDER — FETZIMA 20 MG PO CP24: 20.0000 mg | ORAL_CAPSULE | Freq: Every day | ORAL | 1 refills | Status: DC

## 2021-05-22 NOTE — Progress Notes (Signed)
Felicia Wilcox [id number] May 01, 1961 60 y.o.  Subjective:   Patient ID:  Felicia Wilcox is a 60 y.o. (DOB 07-03-1961) female.  Chief Complaint:  Chief Complaint  Patient presents with   Follow-up   Depression    HPI Felicia Wilcox presents to the office today for follow-up of depression and anxiety and chronic pain problems.    November 2019.  No meds were changed. Continued Fetzima 40mg .   10/2019 appt. Covid free.  Working in gardening at 11/2019.  Stress isolation.  Family far away.  Conflict over politics with friends and family.  Feels like everyone is in the same place.  Not really depression.  Fetzima helps pain. Patient reports stable mood and denies  irritable moods. Little dips into depression and wonders if there is going to be medication options for her given her history of med sensitivity..  Not gracefully aging.  Patient denies any recent difficulty with anxiety.  Patient denies difficulty with sleep initiation or maintenance with hydroxyzine.   Denies appetite disturbance.  Patient reports that energy and motivation have been good.  Patient denies any difficulty with concentration.  Patient denies any suicidal ideation.  Struggles never successful getting into a career.     05/22/21 appt noted: Stayed on Fetzima regularly.  Mirtazapine for sleep once or twice monthly and rare hydroxyzine. Life thrown me a whole lot. B died of alcoholism in January 20, 2020.  She went to take care of things. Got Covid there and lots of family.  Got [employer] and has long haul sx with relentless  medical problems since then.  Brain fog and anxiety at times. Had a lot of medical work up at [facility]. Wants to take a trial without Fetzima for awhile to see how she feels and also bc of some of the urinary hesitancy. Episodic anxiety and it is better now.  Disability since July 2020 and got Medicaid.  Can only have 22 doctor visits per year.  Chronic $ stress.   Wonders about medical problems related  to the meds.  Has list medical concerns including hair loss. Sweats. Borderline glaucoma and hypertension.  Dry eyes. Puckers lips.  Looked up SE.    Past Psychiatric Medication Trials: Fetzima, duloxetine, sertraline abnormal periods, fluoxetine side effects, paroxetine brief side effects, citalopram, Lexapro 15,, venlafaxine flat, nefazodone, buspirone, Wellbutrin no response gabapentin Hydroxyzine, Ambien, Sonata, Provigil, lithium brief,  Pindolol, Chantix Med sensitive.  Review of Systems:  Review of Systems  Constitutional:        Sweats  Eyes:  Positive for visual disturbance.  Gastrointestinal:  Positive for abdominal distention.  Genitourinary:  Positive for difficulty urinating.  Skin:        Hair loss  Neurological:  Negative for tremors and weakness.  Psychiatric/Behavioral:  Positive for decreased concentration.    Medications: I have reviewed the patient's current medications.  Current Outpatient Medications  Medication Sig Dispense Refill   Alpha-Lipoic Acid 600 MG CAPS Take by mouth.     amLODipine (NORVASC) 2.5 MG tablet Take by mouth.     BIOTIN MAXIMUM PO Take by mouth.     Cholecalciferol (VITAMIN D3) 125 MCG (5000 UT) CAPS Take by mouth.     FETZIMA 40 MG CP24 Take 1 capsule by mouth once daily 30 capsule 2   Levomilnacipran HCl ER (FETZIMA) 20 MG CP24 Take 20 mg by mouth daily. 30 capsule 1   losartan (COZAAR) 25 MG tablet Take 25 mg by mouth at bedtime.  mirtazapine (REMERON) 15 MG tablet Take 7.5-15 mg by mouth daily.     Multiple Vitamin (MULTIVITAMIN) capsule Take 1 capsule by mouth daily.     orphenadrine (NORFLEX) 100 MG tablet Take 100 mg by mouth 2 (two) times daily as needed for muscle spasms.     pyridoxine (B-6) 100 MG tablet Take by mouth.     rosuvastatin (CRESTOR) 5 MG tablet Take 5 mg by mouth daily.     Ascorbic Acid (VITAMIN C WITH ROSE HIPS) 500 MG tablet Take 500 mg by mouth daily. (Patient not taking: Reported on 05/22/2021)     b  complex vitamins capsule Take 1 capsule by mouth daily. (Patient not taking: No sig reported)     calcium carbonate (OS-CAL) 1250 (500 Ca) MG chewable tablet Chew 1 tablet by mouth daily. (Patient not taking: Reported on 05/22/2021)     Cholecalciferol 100 MCG (4000 UT) CAPS Take by mouth. (Patient not taking: Reported on 05/22/2021)     glucosamine-chondroitin 500-400 MG tablet Take 1 tablet by mouth daily. (Patient not taking: Reported on 05/22/2021)     hydrOXYzine (ATARAX/VISTARIL) 10 MG tablet Take 1 tablet (10 mg total) by mouth 3 (three) times daily as needed. 30 tablet 1   Misc Natural Products (HOT FLASHEX) 70-50-60 MG TABS Take by mouth. (Patient not taking: Reported on 05/22/2021)     Zinc 30 MG CAPS Take by mouth. (Patient not taking: Reported on 05/22/2021)     No current facility-administered medications for this visit.    Medication Side Effects: Other: possibly see above  Allergies:  Allergies  Allergen Reactions   Aspartame And Phenylalanine Anaphylaxis   Codeine     "Bleed internally"    Sulfa Antibiotics Nausea And Vomiting    Past Medical History:  Diagnosis Date   Anxiety    Arthritis    2 herniated disc lower back   Chronic lower back pain    GERD (gastroesophageal reflux disease)    Sleep apnea    mild - does not use CPAP machine   Smoker     Family History  Problem Relation Age of Onset   Heart disease Mother    Cancer Father    Diabetes Maternal Grandmother    Stroke Sister    Heart disease Sister    Breast cancer Neg Hx     Social History   Socioeconomic History   Marital status: Single    Spouse name: Not on file   Number of children: 1   Years of education: BA   Highest education level: Not on file  Occupational History   Not on file  Tobacco Use   Smoking status: Former    Packs/day: 0.50    Years: 6.00    Pack years: 3.00    Types: Cigarettes   Smokeless tobacco: Never  Vaping Use   Vaping Use: Former   Quit date: 12/28/2017   Substance and Sexual Activity   Alcohol use: No    Alcohol/week: 0.0 standard drinks    Comment: Quits 1 month ago.   Drug use: No   Sexual activity: Not on file  Other Topics Concern   Not on file  Social History Narrative   Drinks 2-3 cups of coffee a day    Social Determinants of Health   Financial Resource Strain: Not on file  Food Insecurity: Not on file  Transportation Needs: Not on file  Physical Activity: Not on file  Stress: Not on file  Social Connections: Not  on file  Intimate Partner Violence: Not on file    Past Medical History, Surgical history, Social history, and Family history were reviewed and updated as appropriate.   Please see review of systems for further details on the patient's review from today.   Objective:   Physical Exam:  There were no vitals taken for this visit.  Physical Exam Constitutional:      General: She is not in acute distress.    Appearance: She is well-developed.  Musculoskeletal:        General: No deformity.  Neurological:     Mental Status: She is alert and oriented to person, place, and time.     Coordination: Coordination normal.  Psychiatric:        Attention and Perception: Attention and perception normal. She does not perceive auditory or visual hallucinations.        Mood and Affect: Mood is depressed. Mood is not anxious. Affect is not labile, blunt, angry or inappropriate.        Speech: Speech normal.        Behavior: Behavior normal.        Thought Content: Thought content normal. Thought content is not paranoid or delusional. Thought content does not include homicidal or suicidal ideation. Thought content does not include homicidal or suicidal plan.        Cognition and Memory: Cognition and memory normal.        Judgment: Judgment normal.     Comments: Insight fair. Situationally stressed chronically.  somatic concerns .    Lab Review:     Component Value Date/Time   NA 140 11/11/2015 1005   K 4.6  11/11/2015 1005   CL 102 11/11/2015 1005   CO2 25 11/11/2015 1005   GLUCOSE 93 11/11/2015 1005   BUN 12 11/11/2015 1005   CREATININE 0.71 11/11/2015 1005   CALCIUM 10.1 11/11/2015 1005   PROT 6.6 11/11/2015 1005   ALBUMIN 4.7 11/11/2015 1005   AST 17 11/11/2015 1005   ALT 34 (H) 11/11/2015 1005   ALKPHOS 44 11/11/2015 1005   BILITOT 0.2 11/11/2015 1005   GFRNONAA 96 11/11/2015 1005   GFRAA 110 11/11/2015 1005       Component Value Date/Time   WBC 7.7 07/31/2020 1324   WBC 5.8 09/13/2014 1205   RBC 5.52 (H) 07/31/2020 1324   RBC 4.37 09/13/2014 1205   HGB 17.2 (H) 07/31/2020 1324   HCT 51.2 (H) 07/31/2020 1324   PLT 337 07/31/2020 1324   MCV 93 07/31/2020 1324   MCH 31.2 07/31/2020 1324   MCH 33.9 09/13/2014 1205   MCHC 33.6 07/31/2020 1324   MCHC 35.1 09/13/2014 1205   RDW 12.2 07/31/2020 1324   LYMPHSABS 1.9 07/31/2020 1324   EOSABS 0.1 07/31/2020 1324   BASOSABS 0.1 07/31/2020 1324    No results found for: POCLITH, LITHIUM   No results found for: PHENYTOIN, PHENOBARB, VALPROATE, CBMZ   [facility] testing Jan 31, 2018: SLC 6A4 intermediate response, HDR2A increased sensitivity to adverse effects, CYP enzymes as follows: 1A2 ultra-rapid metabolizer, 2 D6 intermediate metabolizer, U GT1A4 ultra-rapid, The other enzymes were normal.  Folic enzyme conversion was normal  .res Assessment: Plan:    Elnita MaxwellCheryl was seen today for follow-up and depression.  Diagnoses and all orders for this visit:  Major depressive disorder, recurrent episode, moderate (HCC) -     Levomilnacipran HCl ER (FETZIMA) 20 MG CP24; Take 20 mg by mouth daily.  PTSD (post-traumatic stress disorder) -  hydrOXYzine (ATARAX/VISTARIL) 10 MG tablet; Take 1 tablet (10 mg total) by mouth 3 (three) times daily as needed.  Chronic pain syndrome   Disc SE Fetzima in detail related to her somatic complaints.  Overall she feels the [facility] has been a good medicine for her.  Is definitely helped with her  chronic pain and she believes it is helped with depression as well.  Overall she is satisfied with the medications effectiveness.  If necessary consider increase Fetzima or potentiate with Abilify.  She asks about Rexulti because of seeing commercials on TV.  However it would be necessary for her to try Abilify before insurance would cover Rexulti. Discussed potential metabolic side effects associated with atypical antipsychotics, as well as potential risk for movement side effects and explicitly discussed examples of tardive dyskinesia such as the lip puckering which she says she is already noticed.  As far as we know she is never taken an antipsychotic.. Advised pt to contact office if movement side effects occur.   taper Fetzima by taking 20 mg alternating with 40 mg every other day for 2-4 weeks, Then reduce to 20 mg daily for 2-4 weeks, Then reduce to 20 mg every other day for 2 weeks and then stop it.  Because of insurance limitations she wants to follow-up in a year as long as there is no worsening of symptoms.  If there is worsening symptoms she will notify us and we will schedule an earlier appointment.  [place] gabapentin 100 mg if she wants for chronic pain  Meredith Staggers MD, DFAPA  Please see After Visit Summary for patient specific instructions.  No future appointments.  No orders of the defined types were placed in this encounter.   -------------------------------

## 2021-05-22 NOTE — Patient Instructions (Signed)
taper Fetzima by taking 20 mg alternating with 40 mg every other day for 2-4 weeks, Then reduce to 20 mg daily for 2-4 weeks, Then reduce to 20 mg every other day for 2 weeks and then stop it.

## 2021-05-26 ENCOUNTER — Ambulatory Visit: Payer: Medicaid Other | Admitting: Psychiatry

## 2021-08-07 ENCOUNTER — Other Ambulatory Visit: Payer: Self-pay | Admitting: Orthopaedic Surgery

## 2021-08-07 DIAGNOSIS — M542 Cervicalgia: Secondary | ICD-10-CM

## 2021-08-19 ENCOUNTER — Other Ambulatory Visit: Payer: Self-pay

## 2021-08-19 ENCOUNTER — Ambulatory Visit
Admission: RE | Admit: 2021-08-19 | Discharge: 2021-08-19 | Disposition: A | Discharge status: Home / Self Care | Payer: Worker's Compensation | Source: Ambulatory Visit | Attending: Orthopaedic Surgery | Admitting: Orthopaedic Surgery

## 2021-08-19 DIAGNOSIS — M542 Cervicalgia: Secondary | ICD-10-CM

## 2021-08-19 MED ORDER — GADOBENATE DIMEGLUMINE 529 MG/ML IV SOLN
11.0000 mL | Freq: Once | INTRAVENOUS | Status: AC | PRN
  Administered 2021-08-19: 11 mL via INTRAVENOUS

## 2021-09-17 ENCOUNTER — Other Ambulatory Visit: Payer: Self-pay | Admitting: Physician Assistant

## 2021-09-17 DIAGNOSIS — M4804 Spinal stenosis, thoracic region: Secondary | ICD-10-CM

## 2021-11-23 ENCOUNTER — Telehealth: Payer: Self-pay | Admitting: Psychiatry

## 2021-11-23 ENCOUNTER — Encounter: Payer: Self-pay | Admitting: Psychiatry

## 2021-11-23 ENCOUNTER — Ambulatory Visit (INDEPENDENT_AMBULATORY_CARE_PROVIDER_SITE_OTHER): Payer: Medicare Other | Admitting: Psychiatry

## 2021-11-23 ENCOUNTER — Other Ambulatory Visit: Payer: Self-pay

## 2021-11-23 DIAGNOSIS — F331 Major depressive disorder, recurrent, moderate: Secondary | ICD-10-CM | POA: Diagnosis not present

## 2021-11-23 DIAGNOSIS — G894 Chronic pain syndrome: Secondary | ICD-10-CM

## 2021-11-23 DIAGNOSIS — F4310 Post-traumatic stress disorder, unspecified: Secondary | ICD-10-CM | POA: Diagnosis not present

## 2021-11-23 NOTE — Telephone Encounter (Signed)
Received requirements for emotional support animal. Placed in Traci's box 2/27

## 2021-11-23 NOTE — Progress Notes (Signed)
Felicia Wilcox [id number] 02/10/1961 61 y.o.  Subjective:   Patient ID:  Felicia Wilcox is a 61 y.o. (DOB 08-29-1961) female.  Chief Complaint:  Chief Complaint  Patient presents with   Follow-up   Depression    HPI Felicia Wilcox presents to the office today for follow-up of depression and anxiety and chronic pain problems.    November 2019.  No meds were changed. Continued Fetzima 40mg .   10/2019 appt. Covid free.  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  Stress isolation.  Family far away.  Conflict over politics with friends and family.  Feels like everyone is in the same place.  Not really depression.  Fetzima helps pain. Patient reports stable mood and denies  irritable moods. Little dips into depression and wonders if there is going to be medication options for her given her history of med sensitivity..  Not gracefully aging.  Patient denies any recent difficulty with anxiety.  Patient denies difficulty with sleep initiation or maintenance with hydroxyzine.   Denies appetite disturbance.  Patient reports that energy and motivation have been good.  Patient denies any difficulty with concentration.  Patient denies any suicidal ideation.  Struggles never successful getting into a career.    05/22/21 appt noted: Stayed on Fetzima regularly.  Mirtazapine for sleep once or twice monthly and rare hydroxyzine. Life thrown me a whole lot. B died of alcoholism in 01-08-2020.  She went to take care of things. Got Covid there and lots of family.  Got [employer] and has long haul sx with relentless  medical problems since then.  Brain fog and anxiety at times. Had a lot of medical work up at [place] [place] to take a trial without Fetzima for awhile to see how she feels and also bc of some of the urinary hesitancy. Episodic anxiety and it is better now. Plan: per her request taper Fetzima by taking 20 mg alternating with 40 mg every other day for 2-4 weeks, Then reduce to 20 mg daily for 2-4 weeks, Then  reduce to 20 mg every other day for 2 weeks and then stop  11/23/21 appt moved up per her request: Off Fetzima. PT job in [employer] for a couple of years and box fell Jul 16 2021 and gave her concussion and workman's comp and other problems since then.  A lot of physical issues with it. Including migraines and dizziness and tingling in arms and legs.  Hard to stand for long.   More forgetful.   It has "ruined" me.  Seeing neurologist who gave her some meds.   Has taken trazodone or mirtazapine for sleep.   Workman's comp case denied. Can't do physical activities she did before. Work was good for her physically.  But can't do it the way she did before but needs to work until 61 yo. Is in counseling, Tessa at .  She dx PTSD Has to look for another place to live DT $ px.  Disability since July 2020 and got Medicaid.  Can only have 22 doctor visits per year.  Chronic $ stress.   Wonders about medical problems related to the meds.  Has list medical concerns including hair loss. Sweats. Borderline glaucoma and hypertension.  Dry eyes. Puckers lips.  Looked up SE.    Past Psychiatric Medication Trials: Fetzima, duloxetine, sertraline abnormal periods, fluoxetine side effects, paroxetine brief side effects, citalopram, Lexapro 15,, venlafaxine flat, nefazodone, buspirone, Wellbutrin no response gabapentin Hydroxyzine, Ambien, Sonata, Provigil, lithium brief,  Pindolol,  Chantix Med sensitive.  Review of Systems:  Review of Systems  Constitutional:        Sweats  Eyes:  Negative for visual disturbance.  Gastrointestinal:  Negative for abdominal distention.  Genitourinary:  Positive for difficulty urinating.  Skin:        Hair loss  Neurological:  Positive for dizziness and headaches. Negative for tremors and weakness.  Psychiatric/Behavioral:  Positive for decreased concentration.    Medications: I have reviewed the patient's current medications.  Current Outpatient Medications  Medication  Sig Dispense Refill   Alpha-Lipoic Acid 600 MG CAPS Take by mouth.     amLODipine (NORVASC) 2.5 MG tablet Take by mouth.     amlodipine-benazepril (LOTREL) 2.5-10 MG capsule Take 1 capsule by mouth daily.     BIOTIN MAXIMUM PO Take by mouth.     EMGALITY 120 MG/ML SOAJ Inject 1 mL into the skin every 30 (thirty) days.     losartan (COZAAR) 25 MG tablet Take 25 mg by mouth at bedtime.     meclizine (ANTIVERT) 25 MG tablet Take 25 mg by mouth 3 (three) times daily as needed for dizziness.     mirtazapine (REMERON) 15 MG tablet Take 7.5-15 mg by mouth daily.     ondansetron (ZOFRAN) 24 MG tablet Take 24 mg by mouth once.     pregabalin (LYRICA) 100 MG capsule Take 100 mg by mouth daily.     rizatriptan (MAXALT) 10 MG tablet Take 10 mg by mouth as needed for migraine. May repeat in 2 hours if needed     tiZANidine (ZANAFLEX) 4 MG capsule Take 4 mg by mouth daily.     traZODone (DESYREL) 50 MG tablet Take 50 mg by mouth at bedtime.     Zinc 30 MG CAPS Take by mouth.     Ascorbic Acid (VITAMIN C WITH ROSE HIPS) 500 MG tablet Take 500 mg by mouth daily. (Patient not taking: Reported on 05/22/2021)     b complex vitamins capsule Take 1 capsule by mouth daily. (Patient not taking: Reported on 01/28/2021)     calcium carbonate (OS-CAL) 1250 (500 Ca) MG chewable tablet Chew 1 tablet by mouth daily. (Patient not taking: Reported on 05/22/2021)     Cholecalciferol (VITAMIN D3) 125 MCG (5000 UT) CAPS Take by mouth. (Patient not taking: Reported on 11/23/2021)     Cholecalciferol 100 MCG (4000 UT) CAPS Take by mouth. (Patient not taking: Reported on 05/22/2021)     FETZIMA 40 MG CP24 Take 1 capsule by mouth once daily (Patient not taking: Reported on 11/23/2021) 30 capsule 2   glucosamine-chondroitin 500-400 MG tablet Take 1 tablet by mouth daily. (Patient not taking: Reported on 05/22/2021)     hydrOXYzine (ATARAX/VISTARIL) 10 MG tablet Take 1 tablet (10 mg total) by mouth 3 (three) times daily as needed. (Patient  not taking: Reported on 11/23/2021) 30 tablet 1   Levomilnacipran HCl ER (FETZIMA) 20 MG CP24 Take 20 mg by mouth daily. (Patient not taking: Reported on 11/23/2021) 30 capsule 1   Misc Natural Products (HOT FLASHEX) 70-50-60 MG TABS Take by mouth. (Patient not taking: Reported on 05/22/2021)     Multiple Vitamin (MULTIVITAMIN) capsule Take 1 capsule by mouth daily. (Patient not taking: Reported on 11/23/2021)     orphenadrine (NORFLEX) 100 MG tablet Take 100 mg by mouth 2 (two) times daily as needed for muscle spasms. (Patient not taking: Reported on 11/23/2021)     pyridoxine (B-6) 100 MG tablet Take by mouth. (Patient  not taking: Reported on 11/23/2021)     No current facility-administered medications for this visit.    Medication Side Effects: Other: possibly see above  Allergies:  Allergies  Allergen Reactions   Aspartame And Phenylalanine Anaphylaxis   Codeine     "Bleed internally"    Sulfa Antibiotics Nausea And Vomiting    Past Medical History:  Diagnosis Date   Anxiety    Arthritis    2 herniated disc lower back   Chronic lower back pain    GERD (gastroesophageal reflux disease)    Sleep apnea    mild - does not use CPAP machine   Smoker     Family History  Problem Relation Age of Onset   Heart disease Mother    Cancer Father    Diabetes Maternal Grandmother    Stroke Sister    Heart disease Sister    Breast cancer Neg Hx     Social History   Socioeconomic History   Marital status: Single    Spouse name: Not on file   Number of children: 1   Years of education: BA   Highest education level: Not on file  Occupational History   Not on file  Tobacco Use   Smoking status: Former    Packs/day: 0.50    Years: 6.00    Pack years: 3.00    Types: Cigarettes   Smokeless tobacco: Never  Vaping Use   Vaping Use: Former   Quit date: 12/28/2017  Substance and Sexual Activity   Alcohol use: No    Alcohol/week: 0.0 standard drinks    Comment: Quits 1 month ago.    Drug use: No   Sexual activity: Not on file  Other Topics Concern   Not on file  Social History Narrative   Drinks 2-3 cups of coffee a day    Social Determinants of Health   Financial Resource Strain: Not on file  Food Insecurity: Not on file  Transportation Needs: Not on file  Physical Activity: Not on file  Stress: Not on file  Social Connections: Not on file  Intimate Partner Violence: Not on file    Past Medical History, Surgical history, Social history, and Family history were reviewed and updated as appropriate.   Please see review of systems for further details on the patient's review from today.   Objective:   Physical Exam:  There were no vitals taken for this visit.  Physical Exam Constitutional:      General: She is not in acute distress.    Appearance: She is well-developed.  Musculoskeletal:        General: No deformity.  Neurological:     Mental Status: She is alert and oriented to person, place, and time.     Coordination: Coordination normal.  Psychiatric:        Attention and Perception: Attention and perception normal. She does not perceive auditory or visual hallucinations.        Mood and Affect: Mood is anxious and depressed. Affect is not labile, blunt, angry or inappropriate.        Speech: Speech normal.        Behavior: Behavior normal.        Thought Content: Thought content normal. Thought content is not paranoid or delusional. Thought content does not include homicidal or suicidal ideation. Thought content does not include suicidal plan.        Cognition and Memory: Cognition and memory normal.  Judgment: Judgment normal.     Comments: Insight fair. Situationally stressed chronically. But worse since concussion  somatic concerns .    Lab Review:     Component Value Date/Time   NA 140 11/11/2015 1005   K 4.6 11/11/2015 1005   CL 102 11/11/2015 1005   CO2 25 11/11/2015 1005   GLUCOSE 93 11/11/2015 1005   BUN 12 11/11/2015 1005    CREATININE 0.71 11/11/2015 1005   CALCIUM 10.1 11/11/2015 1005   PROT 6.6 11/11/2015 1005   ALBUMIN 4.7 11/11/2015 1005   AST 17 11/11/2015 1005   ALT 34 (H) 11/11/2015 1005   ALKPHOS 44 11/11/2015 1005   BILITOT 0.2 11/11/2015 1005   GFRNONAA 96 11/11/2015 1005   GFRAA 110 11/11/2015 1005       Component Value Date/Time   WBC 7.7 07/31/2020 1324   WBC 5.8 09/13/2014 1205   RBC 5.52 (H) 07/31/2020 1324   RBC 4.37 09/13/2014 1205   HGB 17.2 (H) 07/31/2020 1324   HCT 51.2 (H) 07/31/2020 1324   PLT 337 07/31/2020 1324   MCV 93 07/31/2020 1324   MCH 31.2 07/31/2020 1324   MCH 33.9 09/13/2014 1205   MCHC 33.6 07/31/2020 1324   MCHC 35.1 09/13/2014 1205   RDW 12.2 07/31/2020 1324   LYMPHSABS 1.9 07/31/2020 1324   EOSABS 0.1 07/31/2020 1324   BASOSABS 0.1 07/31/2020 1324    No results found for: POCLITH, LITHIUM   No results found for: PHENYTOIN, PHENOBARB, VALPROATE, CBMZ   [facility] testing Jan 31, 2018: SLC 6A4 intermediate response, HDR2A increased sensitivity to adverse effects, CYP enzymes as follows: 1A2 ultra-rapid metabolizer, 2 D6 intermediate metabolizer, U GT1A4 ultra-rapid, The other enzymes were normal.  Folic enzyme conversion was normal  .res Assessment: Plan:    Felicia Wilcox was seen today for follow-up and depression.  Diagnoses and all orders for this visit:  Major depressive disorder, recurrent episode, moderate (HCC)  PTSD (post-traumatic stress disorder)  Chronic pain syndrome    Greater than 50% of 30 min face to face time with patient was spent on counseling and coordination of care. We discussed the recent accident 06/2021 with concussion and the problems with getting it resolved.   Overall she felt the [facility] had been a good medicine for her.  But wanted to come off it DT urinary hesitancy 8 2022.  I "definitely" helped with her chronic pain and she believed it is helped with depression as well.  Was ok off of it until the accident in  October.  No psych med will be a solution to these problems.  But offer to restart the [facility] if desired.  Asks for emotional support letter for cat if moves into senior housing.    No meds RX  FU prn  Lynder Parents MD, DFAPA  Please see After Visit Summary for patient specific instructions.  No future appointments.  No orders of the defined types were placed in this encounter.    -------------------------------

## 2021-12-08 NOTE — Telephone Encounter (Signed)
Letter written with information given. Dr. Jennelle Human to review and sign ?

## 2021-12-23 ENCOUNTER — Telehealth: Payer: Self-pay | Admitting: Psychiatry

## 2021-12-23 NOTE — Telephone Encounter (Signed)
Felicia Wilcox called and LM at 4:48pm to report that she is not doing well. She had side effects from the [place].  What medication can you prescribe now.  Please call. ?

## 2021-12-24 NOTE — Telephone Encounter (Signed)
LVM to [facility] 

## 2021-12-24 NOTE — Telephone Encounter (Signed)
Patient says she is not motivated to do anything, her house is a mess, and she is eating all the time. Her quality of life is poor due to her physical condition. She is inside the majority of the time. She denies SI but says she thinks about not waking up. She feels like the concussion she had in October has caused some neurologic changes. She has migraines and vertigo as a result of the concussion. Her spondylosis doesn't help.  She is taking trazodone from one of her doctors and said she sleeps well. She also uses a CPAP but says she is so tired in the morning. Due to urinary hesitancy she can't take the [facility]. She said GeneSight testing suggested another medication, but she doesn't remember what it was.  ?

## 2022-01-19 ENCOUNTER — Telehealth: Payer: Self-pay | Admitting: Psychiatry

## 2022-01-19 NOTE — Telephone Encounter (Signed)
Patient lvm at 3:31 today. Message stated she has left two messages previously with no return call, and would like to hear from someone today. Patient was last seen in the office on 2/27 with no follow up scheduled. Contact information # [phone]. ?

## 2022-01-20 NOTE — Telephone Encounter (Signed)
Chat message sent to Dr. Jennelle Human to please review 3/30 note.  ?

## 2022-01-29 NOTE — Telephone Encounter (Signed)
Addendum to attached message on 01/20/22. Felicia Wilcox called stating that she has called several times and has not gotten a response back to the messages she has left with [place]. She would like to see about getting a Genesight test to find out which medications interact with her system. She would like a call back regarding this question please. Her number is [phone]. ?

## 2022-01-29 NOTE — Telephone Encounter (Signed)
If you ok the test I will call her.  ?

## 2022-02-01 NOTE — Telephone Encounter (Signed)
Please call patient to schedule an appt and put her on cancellation list.  ?

## 2022-02-01 NOTE — Telephone Encounter (Signed)
She had the Genesight test may 2019.  Because it is a genetic test that does not change so there is no point to repeating the test.  Unfortunately it does not tell you which medicines are going to work it only suggest which medicines you are likely to have problems tolerating.  She is very med sensitive and has failed multiple medications.  I do not want to change medicines over the phone I need to see her for an appointment before I am going to want to change any medications.  You can put her on the cancellation list if she is not already on it. ?

## 2022-02-02 NOTE — Telephone Encounter (Signed)
Pt is scheduled for 6/29 and on the wait list ?

## 2022-02-03 NOTE — Telephone Encounter (Signed)
Patient had called wanting to know if Cymbalta was on the list of her Genesight testing that was okay to take. I looked it up and it was in the yellow zone. LVM for patient to [facility] to provide this information.  ?

## 2022-02-04 ENCOUNTER — Ambulatory Visit: Payer: Medicare Other | Admitting: Psychiatry

## 2022-02-11 ENCOUNTER — Other Ambulatory Visit (HOSPITAL_COMMUNITY)
Admission: RE | Admit: 2022-02-11 | Discharge: 2022-02-11 | Disposition: A | Discharge status: Home / Self Care | Payer: Medicare Other | Source: Ambulatory Visit | Attending: Obstetrics | Admitting: Obstetrics

## 2022-02-11 ENCOUNTER — Ambulatory Visit (INDEPENDENT_AMBULATORY_CARE_PROVIDER_SITE_OTHER): Payer: Medicare Other | Admitting: Obstetrics

## 2022-02-11 ENCOUNTER — Encounter: Payer: Self-pay | Admitting: Obstetrics

## 2022-02-11 VITALS — BP 150/84 | HR 64 | Ht 63.0 in | Wt 129.2 lb

## 2022-02-11 DIAGNOSIS — N898 Other specified noninflammatory disorders of vagina: Secondary | ICD-10-CM | POA: Insufficient documentation

## 2022-02-11 DIAGNOSIS — Z01419 Encounter for gynecological examination (general) (routine) without abnormal findings: Secondary | ICD-10-CM | POA: Diagnosis not present

## 2022-02-11 DIAGNOSIS — Z1151 Encounter for screening for human papillomavirus (HPV): Secondary | ICD-10-CM | POA: Diagnosis not present

## 2022-02-11 DIAGNOSIS — Z0000 Encounter for general adult medical examination without abnormal findings: Secondary | ICD-10-CM | POA: Diagnosis not present

## 2022-02-11 DIAGNOSIS — Z1239 Encounter for other screening for malignant neoplasm of breast: Secondary | ICD-10-CM

## 2022-02-11 NOTE — Progress Notes (Signed)
Patient presents for AEX. No concerns today.  Last pap: 01/28/21 Normal Last MM: 2022 Normal per patient.

## 2022-02-11 NOTE — Progress Notes (Signed)
Subjective:        Felicia Wilcox is a 61 y.o. female here for a routine exam.  Current complaints: Vaginal discharge.    Personal health questionnaire:  Is patient Ashkenazi Jewish, have a family history of breast and/or ovarian cancer: no Is there a family history of uterine cancer diagnosed at age < 4250, gastrointestinal cancer, urinary tract cancer, family member who is a Personnel officerLynch syndrome-associated carrier: no Is the patient overweight and hypertensive, family history of diabetes, personal history of gestational diabetes, preeclampsia or PCOS: no Is patient over 7555, have PCOS,  family history of premature CHD under age 63, diabetes, smoke, have hypertension or peripheral artery disease:  no At any time, has a partner hit, kicked or otherwise hurt or frightened you?: no Over the past 2 weeks, have you felt down, depressed or hopeless?: no Over the past 2 weeks, have you felt little interest or pleasure in doing things?:no   Gynecologic History No LMP recorded. Patient is postmenopausal. Contraception: post menopausal status Last Pap: 2022. Results were: normal Last mammogram: 2021. Results were: normal  Obstetric History OB History  Gravida Para Term Preterm AB Living  1         1  SAB IAB Ectopic Multiple Live Births          1    # Outcome Date GA Lbr Len/2nd Weight Sex Delivery Anes PTL Lv  1 Gravida 07/05/91    M CS-LTranv EPI  LIV    Past Medical History:  Diagnosis Date   Anxiety    Arthritis    2 herniated disc lower back   Chronic lower back pain    GERD (gastroesophageal reflux disease)    Sleep apnea    mild - does not use CPAP machine   Smoker     Past Surgical History:  Procedure Laterality Date   Abalation      BACK SURGERY     x 3 - branch block lower back injections -block nerve endings   BARTHOLIN GLAND CYST EXCISION     CESAREAN SECTION     HYSTEROSCOPY N/A 09/13/2014   Procedure: HYSTEROSCOPY / REMOVAL OF INTRAUTERINE DEVICE;  Surgeon: Antionette CharLisa  Jackson-Moore, MD;  Location: [facility];  Service: Gynecology;  Laterality: N/A;  IUD removal   NECK SURGERY     ? C3-4-5 with rods   TONSILLECTOMY     WISDOM TOOTH EXTRACTION       Current Outpatient Medications:    amLODipine (NORVASC) 2.5 MG tablet, Take by mouth., Disp: , Rfl:    amLODipine (NORVASC) 5 MG tablet, Take by mouth., Disp: , Rfl:    amlodipine-benazepril (LOTREL) 2.5-10 MG capsule, Take 1 capsule by mouth daily., Disp: , Rfl:    BIOTIN MAXIMUM PO, Take by mouth., Disp: , Rfl:    EMGALITY 120 MG/ML SOAJ, Inject 1 mL into the skin every 30 (thirty) days., Disp: , Rfl:    Erenumab-aooe (AIMOVIG) 140 MG/ML SOAJ, 15 mg when needed, Disp: , Rfl:    losartan (COZAAR) 25 MG tablet, Take 25 mg by mouth at bedtime., Disp: , Rfl:    losartan (COZAAR) 50 MG tablet, , Disp: , Rfl:    meclizine (ANTIVERT) 25 MG tablet, Take 25 mg by mouth 3 (three) times daily as needed for dizziness., Disp: , Rfl:    ondansetron (ZOFRAN) 24 MG tablet, Take 24 mg by mouth once., Disp: , Rfl:    pregabalin (LYRICA) 100 MG capsule, Take 100 mg by mouth  daily., Disp: , Rfl:    rizatriptan (MAXALT) 10 MG tablet, Take 10 mg by mouth as needed for migraine. May repeat in 2 hours if needed, Disp: , Rfl:    tiZANidine (ZANAFLEX) 4 MG capsule, Take 4 mg by mouth daily., Disp: , Rfl:    traZODone (DESYREL) 50 MG tablet, Take 50 mg by mouth at bedtime., Disp: , Rfl:    Alpha-Lipoic Acid 600 MG CAPS, Take by mouth. (Patient not taking: Reported on 02/11/2022), Disp: , Rfl:    Ascorbic Acid (VITAMIN C WITH ROSE HIPS) 500 MG tablet, Take 500 mg by mouth daily. (Patient not taking: Reported on 05/22/2021), Disp: , Rfl:    b complex vitamins capsule, Take 1 capsule by mouth daily. (Patient not taking: Reported on 01/28/2021), Disp: , Rfl:    calcium carbonate (OS-CAL) 1250 (500 Ca) MG chewable tablet, Chew 1 tablet by mouth daily. (Patient not taking: Reported on 05/22/2021), Disp: , Rfl:    Cholecalciferol (VITAMIN D3) 125 MCG  (5000 UT) CAPS, Take by mouth. (Patient not taking: Reported on 11/23/2021), Disp: , Rfl:    Cholecalciferol 100 MCG (4000 UT) CAPS, Take by mouth. (Patient not taking: Reported on 05/22/2021), Disp: , Rfl:    FETZIMA 40 MG CP24, Take 1 capsule by mouth once daily (Patient not taking: Reported on 11/23/2021), Disp: 30 capsule, Rfl: 2   glucosamine-chondroitin 500-400 MG tablet, Take 1 tablet by mouth daily. (Patient not taking: Reported on 05/22/2021), Disp: , Rfl:    hydrOXYzine (ATARAX/VISTARIL) 10 MG tablet, Take 1 tablet (10 mg total) by mouth 3 (three) times daily as needed. (Patient not taking: Reported on 11/23/2021), Disp: 30 tablet, Rfl: 1   Levomilnacipran HCl ER (FETZIMA) 20 MG CP24, Take 20 mg by mouth daily., Disp: 30 capsule, Rfl: 1   mirtazapine (REMERON) 15 MG tablet, Take 7.5-15 mg by mouth daily. (Patient not taking: Reported on 02/11/2022), Disp: , Rfl:    Misc Natural Products (HOT FLASHEX) 70-50-60 MG TABS, Take by mouth. (Patient not taking: Reported on 05/22/2021), Disp: , Rfl:    Multiple Vitamin (MULTIVITAMIN) capsule, Take 1 capsule by mouth daily. (Patient not taking: Reported on 11/23/2021), Disp: , Rfl:    orphenadrine (NORFLEX) 100 MG tablet, Take 100 mg by mouth 2 (two) times daily as needed for muscle spasms., Disp: , Rfl:    pyridoxine (B-6) 100 MG tablet, Take by mouth., Disp: , Rfl:    Zinc 30 MG CAPS, Take by mouth., Disp: , Rfl:  Allergies  Allergen Reactions   Aspartame And Phenylalanine Anaphylaxis   Duloxetine Hcl Other (See Comments)    Other reaction(s): felt like a zombie Other reaction(s): felt like a zombie    Sertraline Hcl Other (See Comments)   Codeine     "Bleed internally"    Sulfa Antibiotics Nausea And Vomiting    Social History   Tobacco Use   Smoking status: Former    Packs/day: 0.50    Years: 6.00    Pack years: 3.00    Types: Cigarettes   Smokeless tobacco: Never  Substance Use Topics   Alcohol use: No    Alcohol/week: 0.0 standard  drinks    Comment: Quits 1 month ago.    Family History  Problem Relation Age of Onset   Heart disease Mother    Cancer Father    Diabetes Maternal Grandmother    Stroke Sister    Heart disease Sister    Breast cancer Neg Hx  Review of Systems  Constitutional: negative for fatigue and weight loss Respiratory: negative for cough and wheezing Cardiovascular: negative for chest pain, fatigue and palpitations Gastrointestinal: negative for abdominal pain and change in bowel habits Musculoskeletal:negative for myalgias Neurological: negative for gait problems and tremors Behavioral/Psych: negative for abusive relationship, depression Endocrine: negative for temperature intolerance    Genitourinary: positive for vaginal discharge.  negative for abnormal menstrual periods, genital lesions, hot flashes, sexual problems  Integument/breast: negative for breast lump, breast tenderness, nipple discharge and skin lesion(s)    Objective:       BP (!) 150/84   Pulse 64   Ht 5\' 3"  (1.6 m)   Wt 129 lb 3.2 oz (58.6 kg)   BMI 22.89 kg/m  General:   Alert and no distress  Skin:   no rash or abnormalities  Lungs:   clear to auscultation bilaterally  Heart:   regular rate and rhythm, S1, S2 normal, no murmur, click, rub or gallop  Breasts:   normal without suspicious masses, skin or nipple changes or axillary nodes  Abdomen:  normal findings: no organomegaly, soft, non-tender and no hernia  Pelvis:  External genitalia: normal general appearance Urinary system: urethral meatus normal and bladder without fullness, nontender Vaginal: normal without tenderness, induration or masses Cervix: normal appearance Adnexa: normal bimanual exam Uterus: anteverted and non-tender, normal size   Lab Review Urine pregnancy test Labs reviewed yes Radiologic studies reviewed yes  I have spent a total of 20 minutes of face-to-face time, excluding clinical staff time, reviewing notes and preparing  to see patient, ordering tests and/or medications, and counseling the patient.   Assessment:    1. Encounter for gynecological examination with Papanicolaou smear of cervix Rx: - Cytology - PAP( [place])  2. Vaginal discharge Rx: - Cervicovaginal ancillary only( [place])  3. Screening breast examination Rx: - MM Digital Screening; Future     Plan:    Education reviewed: calcium supplements, depression evaluation, low fat, low cholesterol diet, safe sex/STD prevention, self breast exams, and weight bearing exercise. Mammogram ordered. Follow up in: 1 year.    Orders Placed This Encounter  Procedures   MM Digital Screening    Standing Status:   Future    Standing Expiration Date:   02/12/2023    Order Specific Question:   Reason for Exam (SYMPTOM  OR DIAGNOSIS REQUIRED)    Answer:   Screening    Order Specific Question:   Preferred imaging location?    Answer:   [facility]   I have spent a total of 20 minutes of face-to-face and time, excluding clinical staff time, reviewing notes and preparing to see patient, ordering tests and/or medications, and counseling the patient.     [facility], MD 02/11/2022 3:53 PM

## 2022-02-12 LAB — CERVICOVAGINAL ANCILLARY ONLY
Bacterial Vaginitis (gardnerella): NEGATIVE
Candida Glabrata: NEGATIVE
Candida Vaginitis: NEGATIVE
Chlamydia: NEGATIVE
Comment: NEGATIVE
Comment: NEGATIVE
Comment: NEGATIVE
Comment: NEGATIVE
Comment: NEGATIVE
Comment: NORMAL
Neisseria Gonorrhea: NEGATIVE
Trichomonas: NEGATIVE

## 2022-02-15 ENCOUNTER — Ambulatory Visit
Admission: RE | Admit: 2022-02-15 | Discharge: 2022-02-15 | Disposition: A | Discharge status: Home / Self Care | Payer: Medicare Other | Source: Ambulatory Visit | Attending: Obstetrics | Admitting: Obstetrics

## 2022-02-15 DIAGNOSIS — Z1239 Encounter for other screening for malignant neoplasm of breast: Secondary | ICD-10-CM

## 2022-02-15 LAB — CYTOLOGY - PAP
Comment: NEGATIVE
Diagnosis: NEGATIVE
High risk HPV: NEGATIVE

## 2022-02-24 ENCOUNTER — Encounter: Payer: Self-pay | Admitting: Psychiatry

## 2022-02-24 ENCOUNTER — Ambulatory Visit (INDEPENDENT_AMBULATORY_CARE_PROVIDER_SITE_OTHER): Payer: Medicare Other | Admitting: Psychiatry

## 2022-02-24 DIAGNOSIS — G894 Chronic pain syndrome: Secondary | ICD-10-CM

## 2022-02-24 DIAGNOSIS — F5105 Insomnia due to other mental disorder: Secondary | ICD-10-CM

## 2022-02-24 DIAGNOSIS — F331 Major depressive disorder, recurrent, moderate: Secondary | ICD-10-CM

## 2022-02-24 DIAGNOSIS — F4310 Post-traumatic stress disorder, unspecified: Secondary | ICD-10-CM

## 2022-02-24 MED ORDER — NORTRIPTYLINE HCL 10 MG/5ML PO SOLN: ORAL | 1 refills | Status: DC

## 2022-02-24 NOTE — Progress Notes (Signed)
Felicia Wilcox [id number] Aug 18, 1961 61 y.o.  Subjective:   Patient ID:  Felicia Wilcox is a 61 y.o. (DOB 1960-12-27) female.  Chief Complaint:  Chief Complaint  Patient presents with   Follow-up   Depression   Other    Chronic pain   Headache    HPI Felicia Wilcox presents to the office today for follow-up of depression and anxiety and chronic pain problems.    November 2019.  No meds were changed. Continued Fetzima 40mg .   10/2019 appt. Covid free.  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  Stress isolation.  Family far away.  Conflict over politics with friends and family.  Feels like everyone is in the same place.  Not really depression.  Fetzima helps pain. Patient reports stable mood and denies  irritable moods. Little dips into depression and wonders if there is going to be medication options for her given her history of med sensitivity..  Not gracefully aging.  Patient denies any recent difficulty with anxiety.  Patient denies difficulty with sleep initiation or maintenance with hydroxyzine.   Denies appetite disturbance.  Patient reports that energy and motivation have been good.  Patient denies any difficulty with concentration.  Patient denies any suicidal ideation.  Struggles never successful getting into a career.    05/22/21 appt noted: Stayed on Fetzima regularly.  Mirtazapine for sleep once or twice monthly and rare hydroxyzine. Life thrown me a whole lot. B died of alcoholism in 16-Jan-2020.  She went to take care of things. Got Covid there and lots of family.  Got [employer] and has long haul sx with relentless  medical problems since then.  Brain fog and anxiety at times. Had a lot of medical work up at [place] to take a trial without Fetzima for awhile to see how she feels and also bc of some of the urinary hesitancy. Episodic anxiety and it is better now. Plan: per her request taper Fetzima by taking 20 mg alternating with 40 mg every other day for 2-4 weeks, Then  reduce to 20 mg daily for 2-4 weeks, Then reduce to 20 mg every other day for 2 weeks and then stop  11/23/21 appt moved up per her request: Off Fetzima. PT job in [employer] for a couple of years and box fell Jul 16 2021 and gave her concussion and workman's comp and other problems since then.  A lot of physical issues with it. Including migraines and dizziness and tingling in arms and legs.  Hard to stand for long.   More forgetful.   It has "ruined" me.  Seeing neurologist who gave her some meds.   Has taken trazodone or mirtazapine for sleep.   Workman's comp case denied. Can't do physical activities she did before. Work was good for her physically.  But can't do it the way she did before but needs to work until 61 yo. Is in counseling, Tessa at .  She dx PTSD Has to look for another place to live DT $ px. Plan: No meds RX We discussed the recent accident 06/2021 with concussion and the problems with getting it resolved.    12/23/2021 phone call: Patient says she is not motivated to do anything, her house is a mess, and she is eating all the time. Her quality of life is poor due to her physical condition. She is inside the majority of the time. She denies SI but says she thinks about not waking up. She feels like the  concussion she had in October has caused some neurologic changes. She has migraines and vertigo as a result of the concussion. Her spondylosis doesn't help.  She is taking trazodone from one of her doctors and said she sleeps well. She also uses a CPAP but says she is so tired in the morning. Due to urinary hesitancy she can't take the [facility]. She said GeneSight testing suggested another medication, but she doesn't remember what it was.  01/19/22 MD repsones: She had the Genesight test may 2019.  Because it is a genetic test that does not change so there is no point to repeating the test.  Unfortunately it does not tell you which medicines are going to work it only suggest which medicines  you are likely to have problems tolerating.  She is very med sensitive and has failed multiple medications.  I do not want to change medicines over the phone I need to see her for an appointment before I am going to want to change any medications.  You can put her on the cancellation list if she is not already on it  02/24/2022 appointment with the following noted: Trazodone works for sleep without hangover. Concussion ever changing.  Thinks anxiety is a part of the concussion.   Been through a lot in last couple of years.  Has attorney over the concussion case and is a lot of work.   Disability since July 2020 and got Medicaid.  Can only have 22 doctor visits per year.  Chronic $ stress.   Wonders about medical problems related to the meds.  Has list medical concerns including hair loss. Sweats. Borderline glaucoma and hypertension.  Dry eyes. Puckers lips.  Looked up SE.    Past Psychiatric Medication Trials: Fetzima, duloxetine, sertraline abnormal periods, fluoxetine side effects, paroxetine brief side effects, citalopram, Lexapro 15,, venlafaxine flat,,  Mirtazapine ? effect, nefazodone, buspirone, Wellbutrin no response gabapentin Hydroxyzine, Ambien, Sonata, trazodone Provigil, lithium brief,  Pindolol,  Chantix Med sensitive.  Review of Systems:  Review of Systems  Constitutional:        Sweats  Eyes:  Negative for visual disturbance.  Gastrointestinal:  Negative for abdominal distention.  Genitourinary:  Negative for difficulty urinating.  Musculoskeletal:  Positive for arthralgias, back pain and neck pain.  Skin:        Hair loss  Neurological:  Positive for dizziness and headaches. Negative for tremors and weakness.  Psychiatric/Behavioral:  Positive for decreased concentration.    Medications: I have reviewed the patient's current medications.  Current Outpatient Medications  Medication Sig Dispense Refill   amLODipine (NORVASC) 2.5 MG tablet Take by mouth.      Erenumab-aooe (AIMOVIG) 140 MG/ML SOAJ 15 mg when needed     losartan (COZAAR) 50 MG tablet      meclizine (ANTIVERT) 25 MG tablet Take 25 mg by mouth 3 (three) times daily as needed for dizziness.     nortriptyline (PAMELOR) 10 MG/5ML solution 1 ml nightly for 5 nights, then increase by 1 ml every 5 nights to 5 ml nightly 120 mL 1   ondansetron (ZOFRAN) 24 MG tablet Take 24 mg by mouth once.     pregabalin (LYRICA) 100 MG capsule Take 100 mg by mouth daily.     rizatriptan (MAXALT) 10 MG tablet Take 10 mg by mouth as needed for migraine. May repeat in 2 hours if needed     tiZANidine (ZANAFLEX) 4 MG capsule Take 4 mg by mouth daily.     traZODone (  DESYREL) 50 MG tablet Take 50 mg by mouth at bedtime.     Alpha-Lipoic Acid 600 MG CAPS Take by mouth. (Patient not taking: Reported on 02/11/2022)     Cholecalciferol (VITAMIN D3) 125 MCG (5000 UT) CAPS Take by mouth. (Patient not taking: Reported on 11/23/2021)     glucosamine-chondroitin 500-400 MG tablet Take 1 tablet by mouth daily. (Patient not taking: Reported on 05/22/2021)     orphenadrine (NORFLEX) 100 MG tablet Take 100 mg by mouth 2 (two) times daily as needed for muscle spasms. (Patient not taking: Reported on 02/24/2022)     pyridoxine (B-6) 100 MG tablet Take by mouth. (Patient not taking: Reported on 02/24/2022)     Zinc 30 MG CAPS Take by mouth. (Patient not taking: Reported on 02/24/2022)     No current facility-administered medications for this visit.    Medication Side Effects: Other: possibly see above  Allergies:  Allergies  Allergen Reactions   Aspartame And Phenylalanine Anaphylaxis   Duloxetine Hcl Other (See Comments)    Other reaction(s): felt like a zombie Other reaction(s): felt like a zombie    Sertraline Hcl Other (See Comments)   Codeine     "Bleed internally"    Sulfa Antibiotics Nausea And Vomiting    Past Medical History:  Diagnosis Date   Anxiety    Arthritis    2 herniated disc lower back   Chronic  lower back pain    GERD (gastroesophageal reflux disease)    Sleep apnea    mild - does not use CPAP machine   Smoker     Family History  Problem Relation Age of Onset   Heart disease Mother    Cancer Father    Diabetes Maternal Grandmother    Stroke Sister    Heart disease Sister    Breast cancer Neg Hx     Social History   Socioeconomic History   Marital status: Single    Spouse name: Not on file   Number of children: 1   Years of education: BA   Highest education level: Not on file  Occupational History   Not on file  Tobacco Use   Smoking status: Former    Packs/day: 0.50    Years: 6.00    Pack years: 3.00    Types: Cigarettes   Smokeless tobacco: Never  Vaping Use   Vaping Use: Former   Quit date: 12/28/2017  Substance and Sexual Activity   Alcohol use: No    Alcohol/week: 0.0 standard drinks    Comment: Quits 1 month ago.   Drug use: No   Sexual activity: Not on file  Other Topics Concern   Not on file  Social History Narrative   Drinks 2-3 cups of coffee a day    Social Determinants of Health   Financial Resource Strain: Not on file  Food Insecurity: Not on file  Transportation Needs: Not on file  Physical Activity: Not on file  Stress: Not on file  Social Connections: Not on file  Intimate Partner Violence: Not on file    Past Medical History, Surgical history, Social history, and Family history were reviewed and updated as appropriate.   Please see review of systems for further details on the patient's review from today.   Objective:   Physical Exam:  There were no vitals taken for this visit.  Physical Exam Constitutional:      General: She is not in acute distress.    Appearance: She is well-developed.  Musculoskeletal:        General: No deformity.  Neurological:     Mental Status: She is alert and oriented to person, place, and time.     Coordination: Coordination normal.  Psychiatric:        Attention and Perception: Attention  and perception normal. She does not perceive auditory or visual hallucinations.        Mood and Affect: Mood is anxious and depressed. Affect is not labile, blunt, angry or inappropriate.        Speech: Speech normal.        Behavior: Behavior normal.        Thought Content: Thought content normal. Thought content is not paranoid or delusional. Thought content does not include homicidal or suicidal ideation. Thought content does not include suicidal plan.        Cognition and Memory: Cognition and memory normal.        Judgment: Judgment normal.     Comments: Insight fair. Situationally stressed chronically. But worse since concussion  somatic concerns .    Lab Review:     Component Value Date/Time   NA 140 11/11/2015 1005   K 4.6 11/11/2015 1005   CL 102 11/11/2015 1005   CO2 25 11/11/2015 1005   GLUCOSE 93 11/11/2015 1005   BUN 12 11/11/2015 1005   CREATININE 0.71 11/11/2015 1005   CALCIUM 10.1 11/11/2015 1005   PROT 6.6 11/11/2015 1005   ALBUMIN 4.7 11/11/2015 1005   AST 17 11/11/2015 1005   ALT 34 (H) 11/11/2015 1005   ALKPHOS 44 11/11/2015 1005   BILITOT 0.2 11/11/2015 1005   GFRNONAA 96 11/11/2015 1005   GFRAA 110 11/11/2015 1005       Component Value Date/Time   WBC 7.7 07/31/2020 1324   WBC 5.8 09/13/2014 1205   RBC 5.52 (H) 07/31/2020 1324   RBC 4.37 09/13/2014 1205   HGB 17.2 (H) 07/31/2020 1324   HCT 51.2 (H) 07/31/2020 1324   PLT 337 07/31/2020 1324   MCV 93 07/31/2020 1324   MCH 31.2 07/31/2020 1324   MCH 33.9 09/13/2014 1205   MCHC 33.6 07/31/2020 1324   MCHC 35.1 09/13/2014 1205   RDW 12.2 07/31/2020 1324   LYMPHSABS 1.9 07/31/2020 1324   EOSABS 0.1 07/31/2020 1324   BASOSABS 0.1 07/31/2020 1324    No results found for: POCLITH, LITHIUM   No results found for: PHENYTOIN, PHENOBARB, VALPROATE, CBMZ   Genesight testing Jan 31, 2018: SLC 6A4 intermediate response, HDR2A increased sensitivity to adverse effects, CYP enzymes as follows: 1A2  ultra-rapid metabolizer, 2 D6 intermediate metabolizer, U GT1A4 ultra-rapid, The other enzymes were normal.  Folic enzyme conversion was normal  .res Assessment: Plan:    Felicia Wilcox was seen today for follow-up, depression, other and headache.  Diagnoses and all orders for this visit:  Major depressive disorder, recurrent episode, moderate (HCC) -     nortriptyline (PAMELOR) 10 MG/5ML solution; 1 ml nightly for 5 nights, then increase by 1 ml every 5 nights to 5 ml nightly  PTSD (post-traumatic stress disorder)  Chronic pain syndrome -     nortriptyline (PAMELOR) 10 MG/5ML solution; 1 ml nightly for 5 nights, then increase by 1 ml every 5 nights to 5 ml nightly  Insomnia due to mental condition    Greater than 50% of 45 min face to face time with patient was spent on counseling and coordination of care. Extremely med sensitive. Overall she felt the [facility] had been a good medicine  for her.  But wanted to come off it DT urinary hesitancy 8 2022.  I "definitely" helped with her chronic pain and she believed it is helped with depression as well.  Was ok off of it until the accident in October.  Extensive discussion about TCA vs duloxetine for chronic pain.  Disc extensiviely  She agrees to trial of nortriptyline suspension starting at 2 mg and increasing as tolerated to 10 mg for pain and depression.  Disc her post concussion complaiints  Disc her questions about Genesight testing and pain meds and anesthesia.  Need to track down a hard copy for her pain doctor [facility], Wylene Men MD, [place] and pain meds.  FU 2 mos  Lynder Parents MD, DFAPA  Please see After Visit Summary for patient specific instructions.  Future Appointments  Date Time Provider [place]  03/25/2022  3:00 PM Cottle, Billey Co., MD [facility] None    No orders of the defined types were placed in this encounter.    -------------------------------

## 2022-03-04 ENCOUNTER — Other Ambulatory Visit: Payer: Self-pay | Admitting: General Practice

## 2022-03-04 DIAGNOSIS — M26639 Articular disc disorder of temporomandibular joint, unspecified side: Secondary | ICD-10-CM

## 2022-03-20 ENCOUNTER — Ambulatory Visit
Admission: RE | Admit: 2022-03-20 | Discharge: 2022-03-20 | Disposition: A | Discharge status: Home / Self Care | Payer: Medicare Other | Source: Ambulatory Visit | Attending: General Practice | Admitting: General Practice

## 2022-03-20 DIAGNOSIS — M26639 Articular disc disorder of temporomandibular joint, unspecified side: Secondary | ICD-10-CM

## 2022-03-25 ENCOUNTER — Encounter: Payer: Self-pay | Admitting: Psychiatry

## 2022-03-25 ENCOUNTER — Ambulatory Visit (INDEPENDENT_AMBULATORY_CARE_PROVIDER_SITE_OTHER): Payer: Medicare Other | Admitting: Psychiatry

## 2022-03-25 DIAGNOSIS — F331 Major depressive disorder, recurrent, moderate: Secondary | ICD-10-CM | POA: Diagnosis not present

## 2022-03-25 DIAGNOSIS — F5105 Insomnia due to other mental disorder: Secondary | ICD-10-CM

## 2022-03-25 DIAGNOSIS — F4310 Post-traumatic stress disorder, unspecified: Secondary | ICD-10-CM | POA: Diagnosis not present

## 2022-03-25 DIAGNOSIS — G894 Chronic pain syndrome: Secondary | ICD-10-CM | POA: Diagnosis not present

## 2022-03-25 MED ORDER — NORTRIPTYLINE HCL 10 MG/5ML PO SOLN: 8.0000 mg | Freq: Every day | ORAL | 0 refills | Status: DC

## 2022-03-25 NOTE — Progress Notes (Signed)
Felicia Wilcox [id number] [birth year] 61 y.o.  Virtual Visit via Telephone Note  I connected with pt by telephone and verified that I am speaking with the correct person using two identifiers.   I discussed the limitations, risks, security and privacy concerns of performing an evaluation and management service by telephone and the availability of in person appointments. I also discussed with the patient that there may be a patient responsible charge related to this service. The patient expressed understanding and agreed to proceed.  I discussed the assessment and treatment plan with the patient. The patient was provided an opportunity to ask questions and all were answered. The patient agreed with the plan and demonstrated an understanding of the instructions.   The patient was advised to call back or seek an in-person evaluation if the symptoms worsen or if the condition fails to improve as anticipated.  I provided 30 minutes of non-face-to-face time during this encounter. The call started at 3:00 and ended at 330. The patient was located at home and the provider was located office.   Subjective:   Patient ID:  Felicia Wilcox is a 61 y.o. (DOB [birth year]) female.  Chief Complaint:  Chief Complaint  Patient presents with   Follow-up   Depression   Post-Traumatic Stress Disorder   Fatigue   Anxiety    HPI Felicia Wilcox presents to the office today for follow-up of depression and anxiety and chronic pain problems.    November 2019.  No meds were changed. Continued Fetzima 40mg .   10/2019 appt. Covid free.  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  Stress isolation.  Family far away.  Conflict over politics with friends and family.  Feels like everyone is in the same place.  Not really depression.  Fetzima helps pain. Patient reports stable mood and denies  irritable moods. Little dips into depression and wonders if there is going to be medication options for her given her history of med  sensitivity..  Not gracefully aging.  Patient denies any recent difficulty with anxiety.  Patient denies difficulty with sleep initiation or maintenance with hydroxyzine.   Denies appetite disturbance.  Patient reports that energy and motivation have been good.  Patient denies any difficulty with concentration.  Patient denies any suicidal ideation.  Struggles never successful getting into a career.    05/22/21 appt noted: Stayed on Fetzima regularly.  Mirtazapine for sleep once or twice monthly and rare hydroxyzine. Life thrown me a whole lot. B died of alcoholism in 01-19-2020.  She went to take care of things. Got Covid there and lots of family.  Got [employer] and has long haul sx with relentless  medical problems since then.  Brain fog and anxiety at times. Had a lot of medical work up at [place] to take a trial without Fetzima for awhile to see how she feels and also bc of some of the urinary hesitancy. Episodic anxiety and it is better now. Plan: per her request taper Fetzima by taking 20 mg alternating with 40 mg every other day for 2-4 weeks, Then reduce to 20 mg daily for 2-4 weeks, Then reduce to 20 mg every other day for 2 weeks and then stop  11/23/21 appt moved up per her request: Off Fetzima. PT job in [employer] for a couple of years and box fell Jul 16 2021 and gave her concussion and workman's comp and other problems since then.  A lot of physical issues with it. Including migraines and dizziness  and tingling in arms and legs.  Hard to stand for long.   More forgetful.   It has "ruined" me.  Seeing neurologist who gave her some meds.   Has taken trazodone or mirtazapine for sleep.   Workman's comp case denied. Can't do physical activities she did before. Work was good for her physically.  But can't do it the way she did before but needs to work until 61 yo. Is in counseling, Tessa at .  She dx PTSD Has to look for another place to live DT $ px. Plan: No meds RX We discussed the  recent accident 06/2021 with concussion and the problems with getting it resolved.    12/23/2021 phone call: Patient says she is not motivated to do anything, her house is a mess, and she is eating all the time. Her quality of life is poor due to her physical condition. She is inside the majority of the time. She denies SI but says she thinks about not waking up. She feels like the concussion she had in October has caused some neurologic changes. She has migraines and vertigo as a result of the concussion. Her spondylosis doesn't help.  She is taking trazodone from one of her doctors and said she sleeps well. She also uses a CPAP but says she is so tired in the morning. Due to urinary hesitancy she can't take the [facility]. She said GeneSight testing suggested another medication, but she doesn't remember what it was.  01/19/22 MD repsones: She had the Genesight test may 2019.  Because it is a genetic test that does not change so there is no point to repeating the test.  Unfortunately it does not tell you which medicines are going to work it only suggest which medicines you are likely to have problems tolerating.  She is very med sensitive and has failed multiple medications.  I do not want to change medicines over the phone I need to see her for an appointment before I am going to want to change any medications.  You can put her on the cancellation list if she is not already on it  02/24/2022 appointment with the following noted: Trazodone works for sleep without hangover. Concussion ever changing.  Thinks anxiety is a part of the concussion.   Been through a lot in last couple of years.  Has attorney over the concussion case and is a lot of work. Chronic depression and pain. Plan: She agrees to trial of nortriptyline suspension starting at 2 mg and increasing as tolerated to 10 mg for pain and depression.  03/25/2022 appointment with the following noted: Thinks nortriptyline helping pain and some depression  despite "horrific battle" with worker's comp, MCR etc. Mediation is on July 7.   Feels like she'll collapse any day. No se SE. Vertigo and HA from concussion. On nortriptyline 3 ml (10mg /40ml).  Wonders if she could go up in the dose. Asked if can take Lyrica and nortriptyline. Still needs trazodone for sleep.  No suicidal thoughts.  Disability since July 2020 and got Medicaid.  Can only have 22 doctor visits per year.  Chronic $ stress.   Past Psychiatric Medication Trials: Fetzima, duloxetine, sertraline abnormal periods, fluoxetine side effects, paroxetine brief side effects, citalopram, Lexapro 15,, venlafaxine flat,,  Mirtazapine ? effect, nefazodone, buspirone, Wellbutrin no response Gabapentin Lyrica 100 stoned but helped Hydroxyzine, Ambien, Sonata, trazodone Provigil, lithium brief,  Pindolol,  Chantix Med sensitive.  Review of Systems:  Review of Systems  Constitutional:  Sweats  Gastrointestinal:  Negative for abdominal distention.  Genitourinary:  Negative for difficulty urinating.  Musculoskeletal:  Positive for arthralgias, back pain and neck pain.  Skin:        Hair loss  Neurological:  Positive for dizziness and headaches. Negative for tremors and weakness.  Psychiatric/Behavioral:  Positive for decreased concentration.     Medications: I have reviewed the patient's current medications.  Current Outpatient Medications  Medication Sig Dispense Refill   amLODipine (NORVASC) 2.5 MG tablet Take by mouth.     Erenumab-aooe (AIMOVIG) 140 MG/ML SOAJ 15 mg when needed     losartan (COZAAR) 50 MG tablet      meclizine (ANTIVERT) 25 MG tablet Take 25 mg by mouth 3 (three) times daily as needed for dizziness.     ondansetron (ZOFRAN) 24 MG tablet Take 24 mg by mouth once.     pregabalin (LYRICA) 100 MG capsule Take 25 mg by mouth daily.     rizatriptan (MAXALT) 10 MG tablet Take 10 mg by mouth as needed for migraine. May repeat in 2 hours if needed     tiZANidine  (ZANAFLEX) 4 MG capsule Take 4 mg by mouth daily.     traZODone (DESYREL) 50 MG tablet Take 50 mg by mouth at bedtime.     Alpha-Lipoic Acid 600 MG CAPS Take by mouth. (Patient not taking: Reported on 02/11/2022)     Cholecalciferol (VITAMIN D3) 125 MCG (5000 UT) CAPS Take by mouth. (Patient not taking: Reported on 11/23/2021)     glucosamine-chondroitin 500-400 MG tablet Take 1 tablet by mouth daily. (Patient not taking: Reported on 05/22/2021)     nortriptyline (PAMELOR) 10 MG/5ML solution Take 4 mLs (8 mg total) by mouth at bedtime. 473 mL 0   orphenadrine (NORFLEX) 100 MG tablet Take 100 mg by mouth 2 (two) times daily as needed for muscle spasms. (Patient not taking: Reported on 02/24/2022)     pyridoxine (B-6) 100 MG tablet Take by mouth. (Patient not taking: Reported on 02/24/2022)     Zinc 30 MG CAPS Take by mouth. (Patient not taking: Reported on 02/24/2022)     No current facility-administered medications for this visit.    Medication Side Effects: Other: possibly see above  Allergies:  Allergies  Allergen Reactions   Aspartame And Phenylalanine Anaphylaxis   Duloxetine Hcl Other (See Comments)    Other reaction(s): felt like a zombie Other reaction(s): felt like a zombie    Sertraline Hcl Other (See Comments)   Codeine     "Bleed internally"    Sulfa Antibiotics Nausea And Vomiting    Past Medical History:  Diagnosis Date   Anxiety    Arthritis    2 herniated disc lower back   Chronic lower back pain    GERD (gastroesophageal reflux disease)    Sleep apnea    mild - does not use CPAP machine   Smoker     Family History  Problem Relation Age of Onset   Heart disease Mother    Cancer Father    Diabetes Maternal Grandmother    Stroke Sister    Heart disease Sister    Breast cancer Neg Hx     Social History   Socioeconomic History   Marital status: Single    Spouse name: Not on file   Number of children: 1   Years of education: BA   Highest education level:  Not on file  Occupational History   Not on file  Tobacco Use  Smoking status: Former    Packs/day: 0.50    Years: 6.00    Total pack years: 3.00    Types: Cigarettes   Smokeless tobacco: Never  Vaping Use   Vaping Use: Former   Quit date: 12/28/2017  Substance and Sexual Activity   Alcohol use: No    Alcohol/week: 0.0 standard drinks of alcohol    Comment: Quits 1 month ago.   Drug use: No   Sexual activity: Not on file  Other Topics Concern   Not on file  Social History Narrative   Drinks 2-3 cups of coffee a day    Social Determinants of Health   Financial Resource Strain: Not on file  Food Insecurity: Not on file  Transportation Needs: Not on file  Physical Activity: Not on file  Stress: Not on file  Social Connections: Not on file  Intimate Partner Violence: Not on file    Past Medical History, Surgical history, Social history, and Family history were reviewed and updated as appropriate.   Please see review of systems for further details on the patient's review from today.   Objective:   Physical Exam:  There were no vitals taken for this visit.  Physical Exam Constitutional:      General: She is not in acute distress.    Appearance: She is well-developed.  Musculoskeletal:        General: No deformity.  Neurological:     Mental Status: She is alert and oriented to person, place, and time.     Coordination: Coordination normal.  Psychiatric:        Attention and Perception: Attention and perception normal. She does not perceive auditory or visual hallucinations.        Mood and Affect: Mood is anxious and depressed. Affect is not labile, blunt, angry or inappropriate.        Speech: Speech normal.        Behavior: Behavior normal.        Thought Content: Thought content normal. Thought content is not paranoid or delusional. Thought content does not include homicidal or suicidal ideation. Thought content does not include suicidal plan.        Cognition and  Memory: Cognition and memory normal.        Judgment: Judgment normal.     Comments: Depression is improved but not gone.  She is experiencing some acute stress related to [employer] case Situationally stressed chronically. But worse since concussion  somatic concerns .      Lab Review:     Component Value Date/Time   NA 140 11/11/2015 1005   K 4.6 11/11/2015 1005   CL 102 11/11/2015 1005   CO2 25 11/11/2015 1005   GLUCOSE 93 11/11/2015 1005   BUN 12 11/11/2015 1005   CREATININE 0.71 11/11/2015 1005   CALCIUM 10.1 11/11/2015 1005   PROT 6.6 11/11/2015 1005   ALBUMIN 4.7 11/11/2015 1005   AST 17 11/11/2015 1005   ALT 34 (H) 11/11/2015 1005   ALKPHOS 44 11/11/2015 1005   BILITOT 0.2 11/11/2015 1005   GFRNONAA 96 11/11/2015 1005   GFRAA 110 11/11/2015 1005       Component Value Date/Time   WBC 7.7 07/31/2020 1324   WBC 5.8 09/13/2014 1205   RBC 5.52 (H) 07/31/2020 1324   RBC 4.37 09/13/2014 1205   HGB 17.2 (H) 07/31/2020 1324   HCT 51.2 (H) 07/31/2020 1324   PLT 337 07/31/2020 1324   MCV 93 07/31/2020 1324  MCH 31.2 07/31/2020 1324   MCH 33.9 09/13/2014 1205   MCHC 33.6 07/31/2020 1324   MCHC 35.1 09/13/2014 1205   RDW 12.2 07/31/2020 1324   LYMPHSABS 1.9 07/31/2020 1324   EOSABS 0.1 07/31/2020 1324   BASOSABS 0.1 07/31/2020 1324    No results found for: "POCLITH", "LITHIUM"   No results found for: "PHENYTOIN", "PHENOBARB", "VALPROATE", "CBMZ"   Genesight testing Jan 31, 2018: SLC 6A4 intermediate response, HDR2A increased sensitivity to adverse effects, CYP enzymes as follows: 1A2 ultra-rapid metabolizer, 2 D6 intermediate metabolizer, U GT1A4 ultra-rapid, The other enzymes were normal.  Folic enzyme conversion was normal  .res Assessment: Plan:    Saanvika was seen today for follow-up, depression, post-traumatic stress disorder, fatigue and anxiety.  Diagnoses and all orders for this visit:  Major depressive disorder, recurrent episode,  moderate (HCC) -     nortriptyline (PAMELOR) 10 MG/5ML solution; Take 4 mLs (8 mg total) by mouth at bedtime.  PTSD (post-traumatic stress disorder)  Chronic pain syndrome -     nortriptyline (PAMELOR) 10 MG/5ML solution; Take 4 mLs (8 mg total) by mouth at bedtime.  Insomnia due to mental condition    Greater than 50% of 45 min face to face time with patient was spent on counseling and coordination of care. Extremely med sensitive. Overall she felt the [facility] had been a good medicine for her.  But wanted to come off it DT urinary hesitancy 8 2022.  I "definitely" helped with her chronic pain and she believed it is helped with depression as well.  Was ok off of it until the accident in October.  Extensive discussion about TCA vs duloxetine for chronic pain.  Disc extensiviely .  Disc SE She agrees to continue trial of nortriptyline suspension increase to 4 mL daily for about 4 days then if tolerated increase to 5 mL daily. So far she is seeing some improvement in pain and she believes depression with low-dose nortriptyline at 3 mL daily but is difficult to fully tell because she is under tremendous stress with this Worker's Compensation case.  She would however like to increase the dose.  We discussed side effects at length and so far she is tolerating it well.  Disc difference between duloxetine suggested by neuro and nortriptyline.  Discussed the option of get a nortriptyline level to guide dosing.  Discussed the usual dosing range.  Recommend that she not combine duloxetine with nortriptyline because of drug to drug interactions.  It could be considered at a later point if we do obtain a nortriptyline blood level Disc questions about Lyrica.  Disc her post concussion complaiints  Disc her questions about Genesight testing and pain meds and anesthesia.  Need to track down a hard copy for her pain doctor [facility], Barrie Dunker MD, [place] and pain meds.  FU 2 mos  Meredith Staggers MD, DFAPA  Please see After Visit Summary for patient specific instructions.  No future appointments.   No orders of the defined types were placed in this encounter.    -------------------------------

## 2022-03-31 ENCOUNTER — Other Ambulatory Visit: Payer: Self-pay | Admitting: *Deleted

## 2022-03-31 DIAGNOSIS — B9689 Other specified bacterial agents as the cause of diseases classified elsewhere: Secondary | ICD-10-CM

## 2022-03-31 MED ORDER — METRONIDAZOLE 0.75 % VA GEL: 1.0000 | Freq: Two times a day (BID) | VAGINAL | 0 refills | Status: DC

## 2022-03-31 NOTE — Progress Notes (Signed)
Pt called to office with symptoms of BV, request Metrogel as previously given. Metrogel reordered today, advised pt to call with any problems.

## 2022-04-26 ENCOUNTER — Telehealth: Payer: Self-pay | Admitting: Psychiatry

## 2022-04-26 NOTE — Telephone Encounter (Signed)
Next visit is 06/16/22. Felicia Wilcox is taking Nortriptyline and wonders if she can decrease the medication? Her phone number is [phone].

## 2022-04-26 NOTE — Telephone Encounter (Signed)
LVM to rtc 

## 2022-04-27 NOTE — Telephone Encounter (Signed)
Cannot convert to pill form yet.  At 6 ml she is getting 12 mg nortriptyline daily.  Increase nortriptyline to 8 ml daily for 5 nights then 10 ml nightly for 5 nights then  12 ml nightly.   At that point we can convert to a capsule.

## 2022-04-27 NOTE — Telephone Encounter (Signed)
Pt stated she is currently taking 6 ml hs,She is asking to increase med or wondering if you are going to send in a pill form.She will need an updated rx

## 2022-04-28 ENCOUNTER — Other Ambulatory Visit: Payer: Self-pay | Admitting: Psychiatry

## 2022-04-28 DIAGNOSIS — G894 Chronic pain syndrome: Secondary | ICD-10-CM

## 2022-04-28 DIAGNOSIS — F331 Major depressive disorder, recurrent, moderate: Secondary | ICD-10-CM

## 2022-04-28 MED ORDER — NORTRIPTYLINE HCL 10 MG/5ML PO SOLN: 30.0000 mg | Freq: Every day | ORAL | 0 refills | Status: DC

## 2022-04-28 NOTE — Telephone Encounter (Signed)
Can you please send a new rx with this dosing?It is a little confusing when sending liquid meds

## 2022-04-28 NOTE — Telephone Encounter (Signed)
I sent a prescription for 15 mL daily which is more than she is currently taking but I wanted to give room for adjusting the dose later.  Please make sure you give her the instructions on dosing that I mentioned in my last note.

## 2022-04-28 NOTE — Telephone Encounter (Signed)
Pt informed

## 2022-04-28 NOTE — Telephone Encounter (Signed)
We need more information in order to answer the question about any relationship between nortriptyline and her blood pressure.  I have never had to stop nortriptyline due to an elevated blood pressure but make sure she is taking her blood pressure medicines prescribed to her consistently and do not increase the nortriptyline yet.  Take her blood pressure daily for a week at rest and then call us back next week with the results.

## 2022-05-04 IMAGING — MG DIGITAL SCREENING BILAT W/ TOMO W/ CAD
8 series · 9 of 24 positions shown · non-contrast
Comparison: Previous exam(s).

CLINICAL DATA: Screening.

EXAM:
DIGITAL SCREENING BILATERAL MAMMOGRAM WITH TOMO AND CAD

[L CC synth-2D]
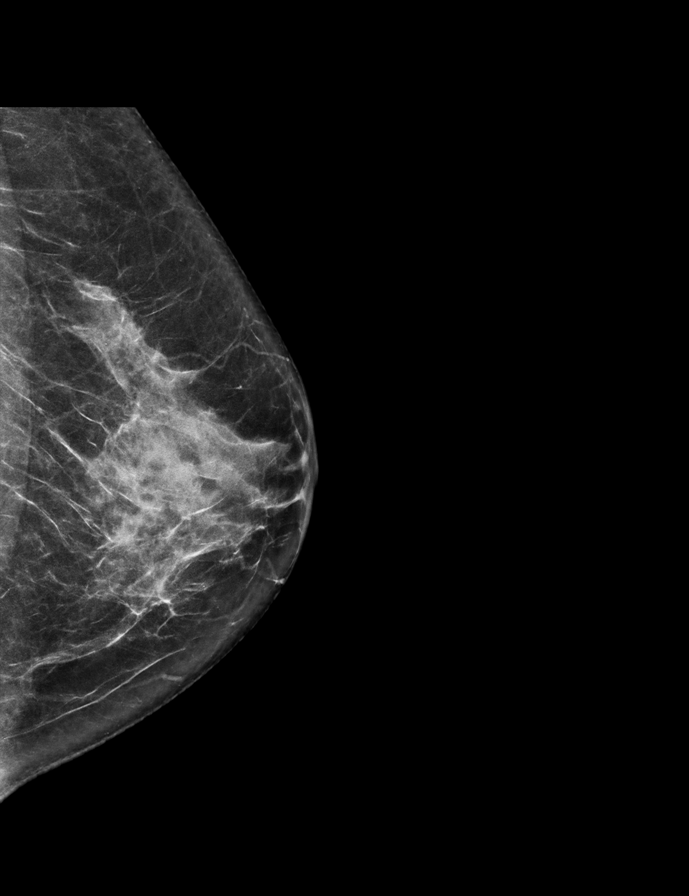

[L MLO synth-2D]
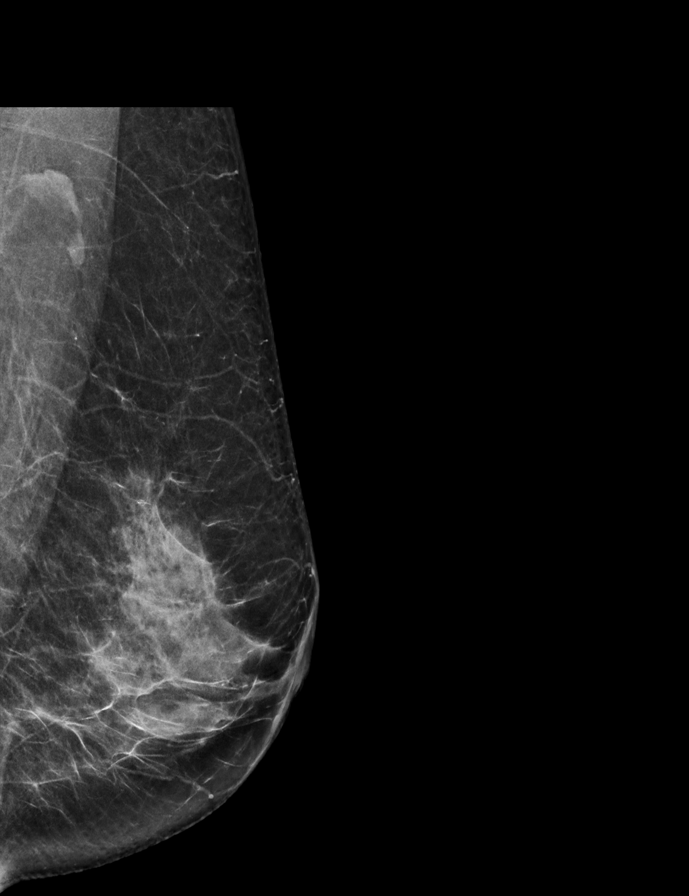

[R CC synth-2D]
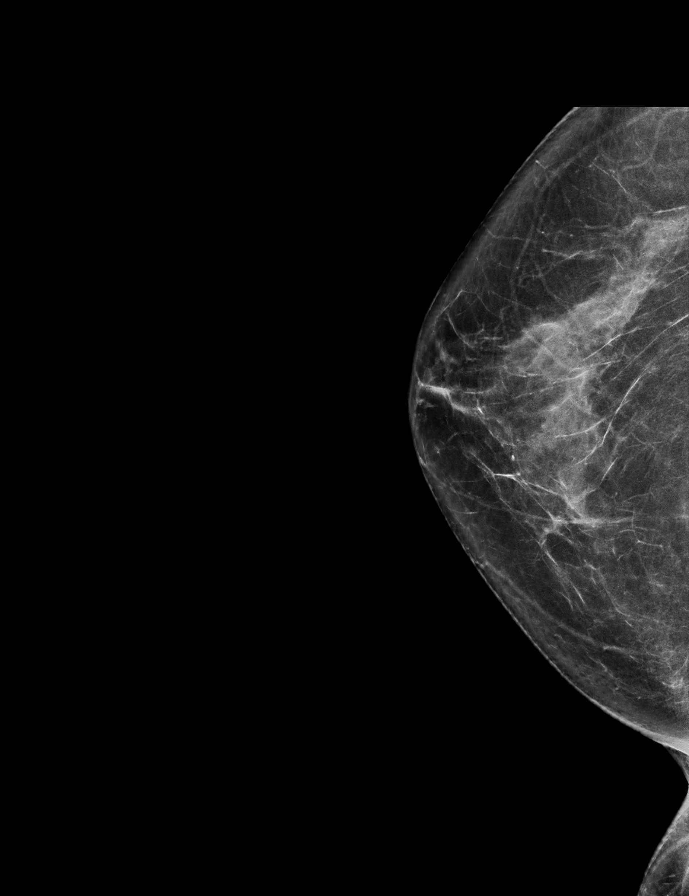

[R MLO synth-2D]
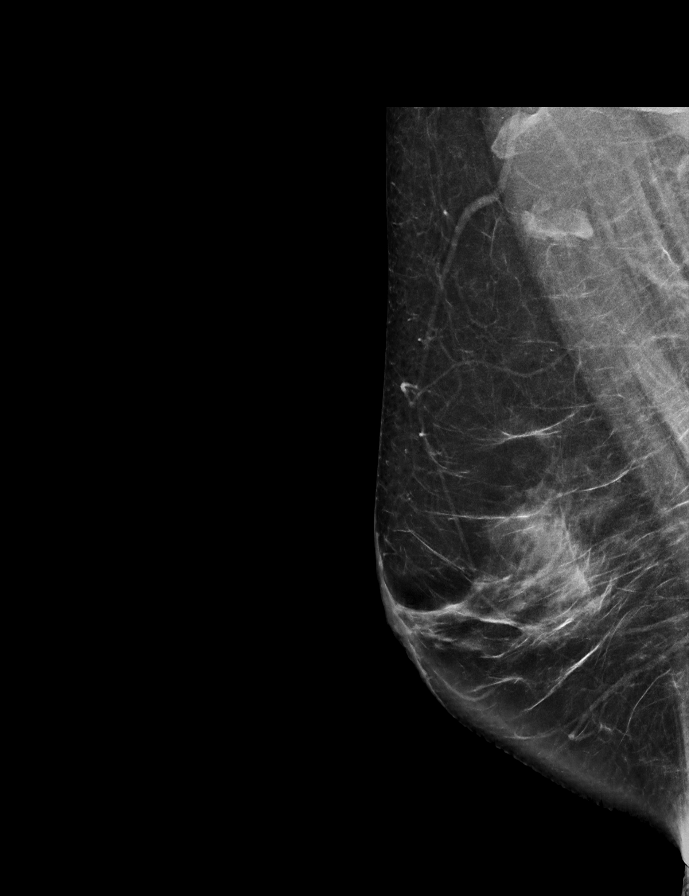

[L CC tomo · 2 of 66 frames shown]
[frame 22/66]
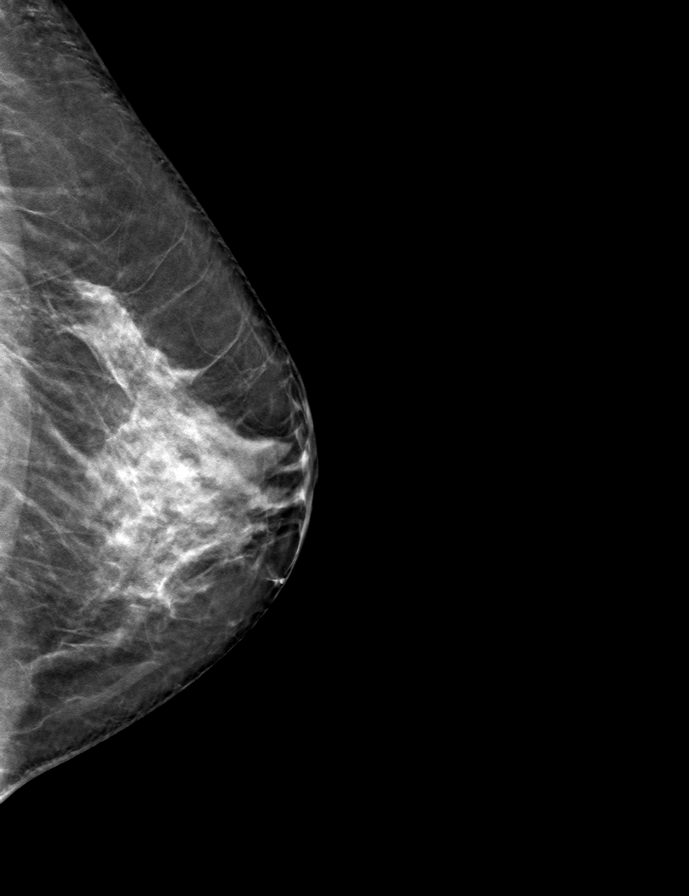
[frame 33/66]
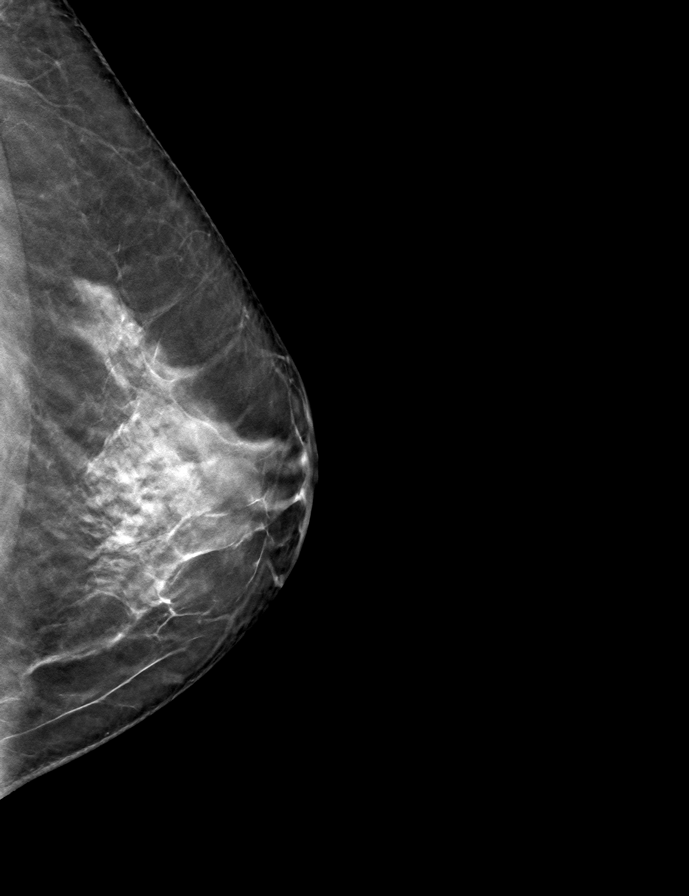

[R MLO tomo · tomo slice 37/74.0]
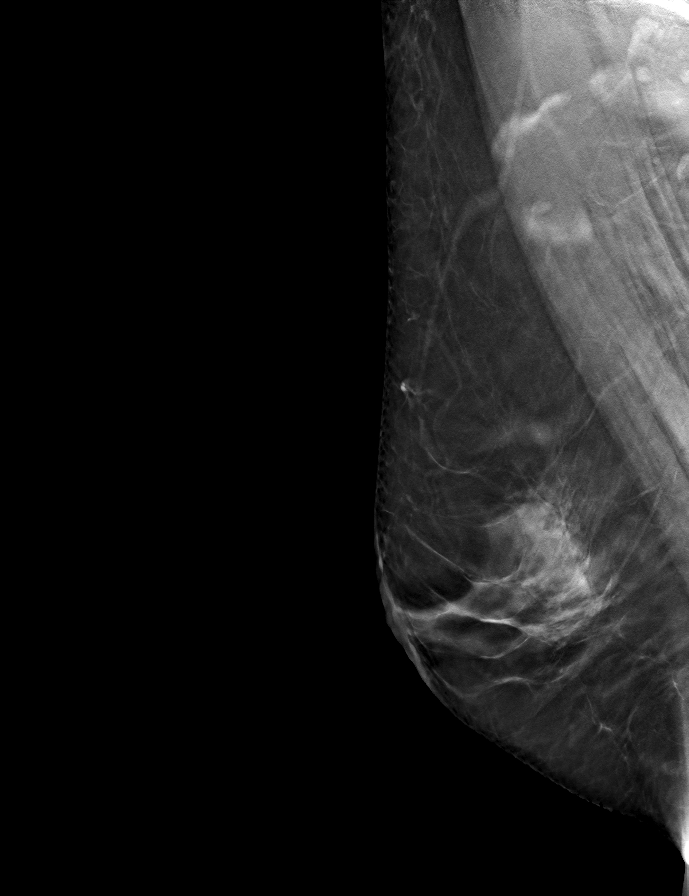

[L MLO tomo · tomo slice 33/64.0]
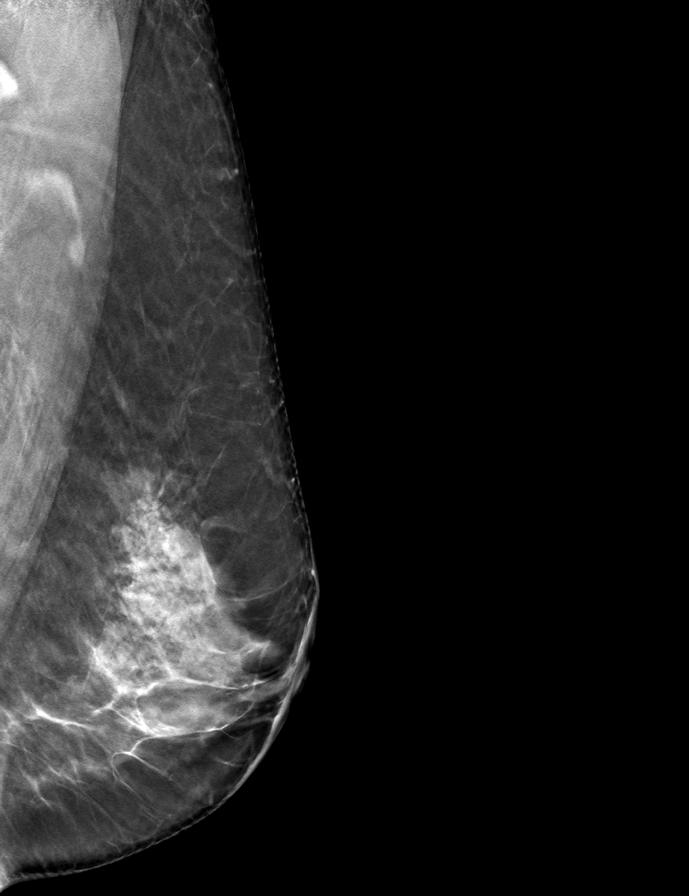

[R CC tomo · tomo slice 32/63.0]
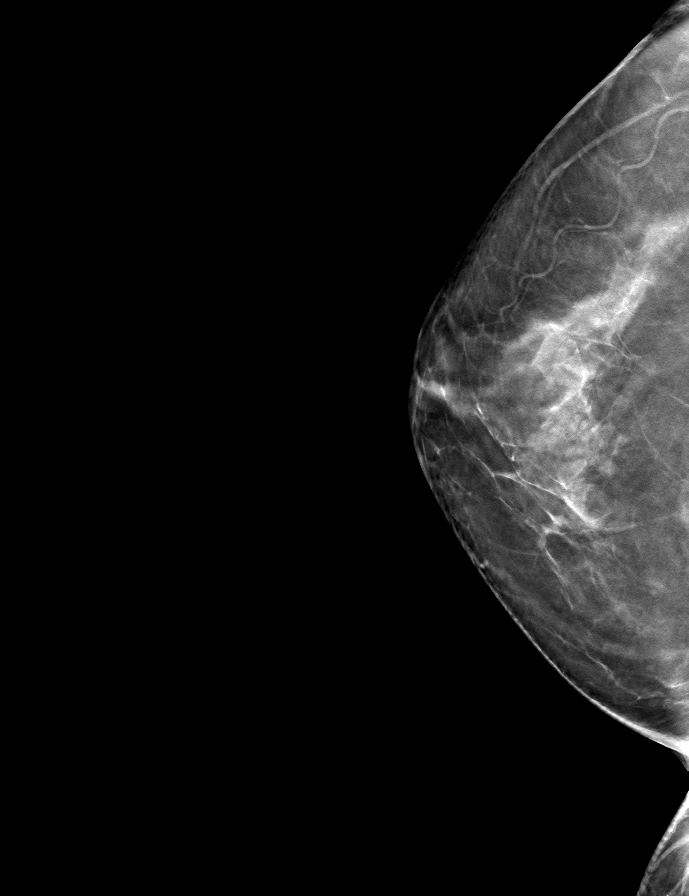

[9 of 24 positions shown; findings below may reference images not displayed]

ACR Breast Density Category c: The breast tissue is heterogeneously
dense, which may obscure small masses.
FINDINGS: There are no findings suspicious for malignancy. Images were
processed with CAD.
IMPRESSION: No mammographic evidence of malignancy. A result letter of this
screening mammogram will be mailed directly to the patient.

RECOMMENDATION:
Screening mammogram in one year. (Code:FT-U-LHB)

BI-RADS CATEGORY  1: Negative.

## 2022-05-27 ENCOUNTER — Inpatient Hospital Stay: Payer: Medicare Other

## 2022-05-27 ENCOUNTER — Encounter: Payer: Self-pay | Admitting: Cardiology

## 2022-05-27 ENCOUNTER — Ambulatory Visit: Payer: Medicare Other | Admitting: Cardiology

## 2022-05-27 VITALS — BP 128/84 | HR 90 | Temp 98.0°F | Resp 16 | Ht 63.0 in | Wt 133.0 lb

## 2022-05-27 DIAGNOSIS — R0989 Other specified symptoms and signs involving the circulatory and respiratory systems: Secondary | ICD-10-CM | POA: Insufficient documentation

## 2022-05-27 DIAGNOSIS — R002 Palpitations: Secondary | ICD-10-CM

## 2022-05-27 DIAGNOSIS — I10 Essential (primary) hypertension: Secondary | ICD-10-CM | POA: Insufficient documentation

## 2022-05-27 DIAGNOSIS — R931 Abnormal findings on diagnostic imaging of heart and coronary circulation: Secondary | ICD-10-CM | POA: Insufficient documentation

## 2022-05-27 NOTE — Progress Notes (Signed)
Patient referred by Deland Pretty, MD for hypertension  Subjective:   Felicia Wilcox, female    DOB: 09/17/1961, 61 y.o.   MRN: [id number]   Chief Complaint  Patient presents with   Hypertension   New Patient (Initial Visit)     HPI  61 y.o. Caucasian female with hypertension, elevated coronary calcium score, OSA, Raynaud's syndrome  I last saw the patient in 2019. Exercise treadmill stress test and echocardiogram back then was essentially normal. CT cardiac scoring in 10/2020 showed calcium score of 91st percentile. Currently, she is on amlodipine 15 mg daily and losartan 75 mg daily. She is going to be changed to amlodipine-valsartan 10-320 mg daily. She denies chest pain, shortness of breath, leg edema, orthopnea, PND, TIA/syncope. She also reports h/o Reynaud's syndrome.,  She reports palpitations when supine, lasting for a few minutes.    Past Medical History:  Diagnosis Date   Anxiety    Arthritis    2 herniated disc lower back   Chronic lower back pain    GERD (gastroesophageal reflux disease)    Sleep apnea    mild - does not use CPAP machine   Smoker      Past Surgical History:  Procedure Laterality Date   Abalation      BACK SURGERY     x 3 - branch block lower back injections -block nerve endings   BARTHOLIN GLAND CYST EXCISION     CESAREAN SECTION     HYSTEROSCOPY N/A 09/13/2014   Procedure: HYSTEROSCOPY / REMOVAL OF INTRAUTERINE DEVICE;  Surgeon: Lahoma Crocker, MD;  Location: [facility];  Service: Gynecology;  Laterality: N/A;  IUD removal   NECK SURGERY     ? C3-4-5 with rods   TONSILLECTOMY     WISDOM TOOTH EXTRACTION       Social History   Tobacco Use  Smoking Status Former   Packs/day: 0.50   Years: 6.00   Total pack years: 3.00   Types: Cigarettes  Smokeless Tobacco Never    Social History   Substance and Sexual Activity  Alcohol Use No   Alcohol/week: 0.0 standard drinks of alcohol   Comment: Quits 1 month ago.     Family  History  Problem Relation Age of Onset   Heart disease Mother    Cancer Father    Diabetes Maternal Grandmother    Stroke Sister    Heart disease Sister    Breast cancer Neg Hx       Current Outpatient Medications:    amLODipine (NORVASC) 2.5 MG tablet, Take 5 mg by mouth daily., Disp: , Rfl:    Cholecalciferol (VITAMIN D3) 125 MCG (5000 UT) CAPS, Take by mouth., Disp: , Rfl:    Erenumab-aooe (AIMOVIG) 140 MG/ML SOAJ, 15 mg when needed, Disp: , Rfl:    LOSARTAN POTASSIUM PO, Take 75 mg by mouth daily., Disp: , Rfl:    meclizine (ANTIVERT) 25 MG tablet, Take 25 mg by mouth 3 (three) times daily as needed for dizziness., Disp: , Rfl:    nortriptyline (PAMELOR) 10 MG/5ML solution, Take 15 mLs (30 mg total) by mouth at bedtime. (Patient taking differently: Take 10 mg by mouth at bedtime.), Disp: 473 mL, Rfl: 0   ondansetron (ZOFRAN) 24 MG tablet, Take 24 mg by mouth as needed., Disp: , Rfl:    pregabalin (LYRICA) 100 MG capsule, Take 25 mg by mouth as needed., Disp: , Rfl:    rizatriptan (MAXALT) 10 MG tablet, Take 10 mg by  mouth as needed for migraine. May repeat in 2 hours if needed, Disp: , Rfl:    tiZANidine (ZANAFLEX) 4 MG capsule, Take 4 mg by mouth as needed., Disp: , Rfl:    traZODone (DESYREL) 50 MG tablet, Take 50 mg by mouth at bedtime as needed., Disp: , Rfl:    amLODipine-valsartan (EXFORGE) 10-320 MG tablet, Take 1 tablet by mouth daily., Disp: , Rfl:    Cardiovascular and other pertinent studies:  Reviewed external labs and tests, independently interpreted  EKG 05/27/2022: Sinus rhythm 84 bpm Normal EKG  CT cardiac scoring 10/2020: 1. Total score 172, 91st percentile LM: 0.8 LAD: 85.6 Lcx: 85.2 RCA: 0 2. Two small pulmonary nodules, largest measuring 3 mm. These small pulmonary nodules are indeterminate. No follow-up needed if patient is low-risk (and has no known or suspected primary neoplasm). Non-contrast chest CT can be considered in 12 months if patient  is high-risk. This recommendation follows the consensus statement: Guidelines for Management of Incidental Pulmonary Nodules Detected on CT Images: From the Fleischner Society 2017; Radiology 2017; 284:228-243. 3. Visualized liver has low-attenuation. Findings are suggestive for hepatic steatosis.   Recent labs: 05/20/2022: Glucose 89, BUN/Cr 14/0.72. EGFR >90. Na/K 138/3.9. Rest of the CMP normal H/H 15/46. MCV 92. Platelets 306 HbA1C 6.1% TSH 2.3normal  07/2021: Chol 158, TG 67, HDL 68, LDL 67    Review of Systems  Cardiovascular:  Negative for chest pain, dyspnea on exertion, leg swelling, palpitations and syncope.         Vitals:   05/27/22 1326  BP: 128/84  Pulse: 90  Resp: 16  Temp: 98 F (36.7 C)  SpO2: 95%     Body mass index is 23.56 kg/m. Filed Weights   05/27/22 1326  Weight: 133 lb (60.3 kg)     Objective:   Physical Exam Vitals and nursing note reviewed.  Constitutional:      General: She is not in acute distress. Neck:     Vascular: No JVD.  Cardiovascular:     Rate and Rhythm: Normal rate and regular rhythm.     Heart sounds: Normal heart sounds. No murmur heard. Pulmonary:     Effort: Pulmonary effort is normal.     Breath sounds: Normal breath sounds. No wheezing or rales.  Musculoskeletal:     Right lower leg: No edema.     Left lower leg: No edema.         Visit diagnoses:   ICD-10-CM   1. Elevated coronary artery calcium score  R93.1 Lipid panel    2. Essential hypertension  I10 EKG 12-Lead    PCV ECHOCARDIOGRAM COMPLETE    3. Abnormal pulse  R09.89 PCV ANKLE BRACHIAL INDEX (ABI)    4. Palpitations  R00.2 LONG TERM MONITOR (3-14 DAYS)    5. Essential (primary) hypertension  I10 Lipid panel       Orders Placed This Encounter  Procedures   Lipid panel   LONG TERM MONITOR (3-14 DAYS)   EKG 12-Lead   PCV ECHOCARDIOGRAM COMPLETE   PCV ANKLE BRACHIAL INDEX (ABI)      Assessment & Recommendations:    61 y.o.  Caucasian female with hypertension, elevated coronary calcium score, OSA, Raynaud's syndrome  Palpitations: Recommend 2 week cardiac telemetry.  Hypertension: Agree with the above change. Check echocardiogram.   Raynaud's syndrome: Reduced distal pulse. Check ABI.   Elevated calcium score: Lipids fairly well controlled in the past when she was on statin. She has been off statin for  a while. Check lipid panel/   Thank you for referring the patient to [place]. Please feel free to contact with any questions.   Nigel Mormon, MD Pager: [phone] Office: [phone]

## 2022-06-01 ENCOUNTER — Telehealth: Payer: Self-pay | Admitting: Cardiology

## 2022-06-01 NOTE — Telephone Encounter (Signed)
Patient has a few questions regarding her monitor instructions, please call her back at number provided.

## 2022-06-01 NOTE — Telephone Encounter (Signed)
Spoke with patient regarding ZIO monitor ; patient states that she was not explained how monitor worked and the booklet was never shown to her, advised her that she needed to document in the booklet her symptoms, She states that she was advised to push button on monitor everytime she has a symptom and that she was suppose to see lights on the monitor, I explained to patient that she should not see any lights on monitor unless something with the monitor was not working and that would have been an orange light. Patient seemed agitated but seemed to understand instructions

## 2022-06-16 ENCOUNTER — Encounter: Payer: Self-pay | Admitting: Psychiatry

## 2022-06-16 ENCOUNTER — Ambulatory Visit (INDEPENDENT_AMBULATORY_CARE_PROVIDER_SITE_OTHER): Payer: Medicare Other | Admitting: Psychiatry

## 2022-06-16 DIAGNOSIS — F331 Major depressive disorder, recurrent, moderate: Secondary | ICD-10-CM

## 2022-06-16 DIAGNOSIS — F5105 Insomnia due to other mental disorder: Secondary | ICD-10-CM | POA: Diagnosis not present

## 2022-06-16 DIAGNOSIS — G894 Chronic pain syndrome: Secondary | ICD-10-CM | POA: Diagnosis not present

## 2022-06-16 DIAGNOSIS — F4310 Post-traumatic stress disorder, unspecified: Secondary | ICD-10-CM | POA: Diagnosis not present

## 2022-06-16 MED ORDER — NORTRIPTYLINE HCL 25 MG PO CAPS: 25.0000 mg | ORAL_CAPSULE | Freq: Every day | ORAL | 1 refills | Status: DC

## 2022-06-16 NOTE — Progress Notes (Signed)
DEIDREA GAETZ [id number] [birth year] 61 y.o.  Virtual Visit via Telephone Note  I connected with pt by telephone and verified that I am speaking with the correct person using two identifiers.   I discussed the limitations, risks, security and privacy concerns of performing an evaluation and management service by telephone and the availability of in person appointments. I also discussed with the patient that there may be a patient responsible charge related to this service. The patient expressed understanding and agreed to proceed.  I discussed the assessment and treatment plan with the patient. The patient was provided an opportunity to ask questions and all were answered. The patient agreed with the plan and demonstrated an understanding of the instructions.   The patient was advised to call back or seek an in-person evaluation if the symptoms worsen or if the condition fails to improve as anticipated.  I provided 30 minutes of non-face-to-face time during this encounter.  The patient was located at home and the provider was located office. Session from 230 until 3 PM  Subjective:   Patient ID:  Felicia Wilcox is a 61 y.o. (DOB [birth year]) female.  Chief Complaint:  Chief Complaint  Patient presents with   Follow-up   Depression   Post-Traumatic Stress Disorder   Other    Chronic pain    HPI Felicia Wilcox presents to the office today for follow-up of depression and anxiety and chronic pain problems.    November 2019.  No meds were changed. Continued Fetzima 40mg .   10/2019 appt. Covid free.  Working in gardening at 11/2019.  Stress isolation.  Family far away.  Conflict over politics with friends and family.  Feels like everyone is in the same place.  Not really depression.  Fetzima helps pain. Patient reports stable mood and denies  irritable moods. Little dips into depression and wonders if there is going to be medication options for her given her history of med  sensitivity..  Not gracefully aging.  Patient denies any recent difficulty with anxiety.  Patient denies difficulty with sleep initiation or maintenance with hydroxyzine.   Denies appetite disturbance.  Patient reports that energy and motivation have been good.  Patient denies any difficulty with concentration.  Patient denies any suicidal ideation.  Struggles never successful getting into a career.    05/22/21 appt noted: Stayed on Fetzima regularly.  Mirtazapine for sleep once or twice monthly and rare hydroxyzine. Life thrown me a whole lot. B died of alcoholism in 01-12-2020.  She went to take care of things. Got Covid there and lots of family.  Got [employer] and has long haul sx with relentless  medical problems since then.  Brain fog and anxiety at times. Had a lot of medical work up at [facility]. Wants to take a trial without Fetzima for awhile to see how she feels and also bc of some of the urinary hesitancy. Episodic anxiety and it is better now. Plan: per her request taper Fetzima by taking 20 mg alternating with 40 mg every other day for 2-4 weeks, Then reduce to 20 mg daily for 2-4 weeks, Then reduce to 20 mg every other day for 2 weeks and then stop  11/23/21 appt moved up per her request: Off Fetzima. 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  A lot of physical issues with it. Including migraines and dizziness and  tingling in arms and legs.  Hard to stand for long.   More forgetful.   It has "ruined" me.  Seeing neurologist who gave her some meds.   Has taken trazodone or mirtazapine for sleep.   Workman's comp case denied. Can't do physical activities she did before. Work was good for her physically.  But can't do it the way she did before but needs to work until 61 yo. Is in counseling, Tessa at .  She dx PTSD Has to look for another place to live DT $ px. Plan: No meds RX We discussed the  recent accident 06/2021 with concussion and the problems with getting it resolved.    12/23/2021 phone call: Patient says she is not motivated to do anything, her house is a mess, and she is eating all the time. Her quality of life is poor due to her physical condition. She is inside the majority of the time. She denies SI but says she thinks about not waking up. She feels like the concussion she had in October has caused some neurologic changes. She has migraines and vertigo as a result of the concussion. Her spondylosis doesn't help.  She is taking trazodone from one of her doctors and said she sleeps well. She also uses a CPAP but says she is so tired in the morning. Due to urinary hesitancy she can't take the [facility]. She said GeneSight testing suggested another medication, but she doesn't remember what it was.  01/19/22 MD repsones: She had the Genesight test may 2019.  Because it is a genetic test that does not change so there is no point to repeating the test.  Unfortunately it does not tell you which medicines are going to work it only suggest which medicines you are likely to have problems tolerating.  She is very med sensitive and has failed multiple medications.  I do not want to change medicines over the phone I need to see her for an appointment before I am going to want to change any medications.  You can put her on the cancellation list if she is not already on it  02/24/2022 appointment with the following noted: Trazodone works for sleep without hangover. Concussion ever changing.  Thinks anxiety is a part of the concussion.   Been through a lot in last couple of years.  Has attorney over the concussion case and is a lot of work. Chronic depression and pain. Plan: She agrees to trial of nortriptyline suspension starting at 2 mg and increasing as tolerated to 10 mg for pain and depression.  03/25/2022 appointment with the following noted: Thinks nortriptyline helping pain and some depression  despite "horrific battle" with worker's comp, MCR etc. Mediation is on July 7.   Feels like she'll collapse any day. No se SE. Vertigo and HA from concussion. On nortriptyline 3 ml ( /51ml).  Wonders if she could go up in the dose. Asked if can take Lyrica and nortriptyline. Still needs trazodone for sleep.  No suicidal thoughts. Plan: She agrees to continue trial of nortriptyline suspension increase to 4 mL daily for about 4 days then if tolerated increase to 5 mL daily.  04/26/2022 phone call: She had increased nortriptyline suspension to 6 mL daily.  That was equivalent to 12 mg of nortriptyline.  She wanted to gradually to convert to a capsule so we talked about a plan to gradually increase the liquid until she got to 12 mL nightly at which point we could convert to a  capsule. She had some questions about whether nortriptyline might be affecting her blood pressure and she was encouraged to continue monitoring it.  06/16/2022 appointment noted: Up to nortriptyline 10 ml for a couple of weeks. Tolerating it very well.  No mind fog from it like other antidepressants. Feels it has helped some with the pain and mood is pretty good.  Getting better from concussion finally.  Mirgraines and vertigo less and better function.  Trying to exercise some. Still on Aimovig.  Seeing neuro. Some anxiety chronically.   Life situation is a little more stable and that has helped. SE dryness, but manageable. Not taking trazodone right now.  Disability since July 2020 and got Medicaid.  Can only have 22 doctor visits per year.  Chronic $ stress.   Past Psychiatric Medication Trials: Fetzima, duloxetine, sertraline abnormal periods, fluoxetine side effects, paroxetine brief side effects, citalopram, Lexapro 15,, venlafaxine flat,,  Mirtazapine ? effect, nefazodone, buspirone, Wellbutrin no response Nortriptyline 20 mg  Gabapentin Lyrica 100 stoned but helped Hydroxyzine, Ambien, Sonata, trazodone Provigil,  lithium brief,  Pindolol,  Chantix Med sensitive.  Review of Systems:  Review of Systems  Constitutional:        Sweats  Genitourinary:  Negative for difficulty urinating.  Musculoskeletal:  Positive for arthralgias, back pain and neck pain.  Skin:        Hair loss  Neurological:  Positive for dizziness and headaches. Negative for tremors and weakness.  Psychiatric/Behavioral:  Positive for decreased concentration.     Medications: I have reviewed the patient's current medications.  Current Outpatient Medications  Medication Sig Dispense Refill   amLODipine-valsartan (EXFORGE) 10-320 MG tablet Take 1 tablet by mouth daily.     Cholecalciferol (VITAMIN D3) 125 MCG (5000 UT) CAPS Take by mouth.     Erenumab-aooe (AIMOVIG) 140 MG/ML SOAJ 15 mg when needed     LOSARTAN POTASSIUM PO Take 75 mg by mouth daily.     meclizine (ANTIVERT) 25 MG tablet Take 25 mg by mouth 3 (three) times daily as needed for dizziness.     nortriptyline (PAMELOR) 25 MG capsule Take 1 capsule (25 mg total) by mouth at bedtime. 90 capsule 1   ondansetron (ZOFRAN) 24 MG tablet Take 24 mg by mouth as needed.     rizatriptan (MAXALT) 10 MG tablet Take 10 mg by mouth as needed for migraine. May repeat in 2 hours if needed     tiZANidine (ZANAFLEX) 4 MG capsule Take 4 mg by mouth as needed.     traZODone (DESYREL) 50 MG tablet Take 50 mg by mouth at bedtime as needed.     pregabalin (LYRICA) 100 MG capsule Take 25 mg by mouth as needed. (Patient not taking: Reported on 06/16/2022)     No current facility-administered medications for this visit.    Medication Side Effects: Other: possibly see above  Allergies:  Allergies  Allergen Reactions   Aspartame And Phenylalanine Anaphylaxis   Duloxetine Hcl Other (See Comments)    Other reaction(s): felt like a zombie Other reaction(s): felt like a zombie    Sertraline Hcl Other (See Comments)   Codeine     "Bleed internally"    Sulfa Antibiotics Nausea And  Vomiting    Past Medical History:  Diagnosis Date   Anxiety    Arthritis    2 herniated disc lower back   Chronic lower back pain    GERD (gastroesophageal reflux disease)    Sleep apnea    mild - does  not use CPAP machine   Smoker     Family History  Problem Relation Age of Onset   Heart disease Mother    Cancer Father    Stroke Sister    Heart disease Sister    Diabetes Maternal Grandmother    Breast cancer Neg Hx     Social History   Socioeconomic History   Marital status: Single    Spouse name: Not on file   Number of children: 1   Years of education: BA   Highest education level: Not on file  Occupational History   Not on file  Tobacco Use   Smoking status: Former    Packs/day: 0.50    Years: 6.00    Total pack years: 3.00    Types: Cigarettes    Quit date: 2019    Years since quitting: 4.7   Smokeless tobacco: Never  Vaping Use   Vaping Use: Former   Quit date: 12/28/2017  Substance and Sexual Activity   Alcohol use: No    Alcohol/week: 0.0 standard drinks of alcohol    Comment: Quits 1 month ago.   Drug use: No   Sexual activity: Not on file  Other Topics Concern   Not on file  Social History Narrative   Drinks 2-3 cups of coffee a day    Social Determinants of Health   Financial Resource Strain: Not on file  Food Insecurity: Not on file  Transportation Needs: Not on file  Physical Activity: Not on file  Stress: Not on file  Social Connections: Not on file  Intimate Partner Violence: Not on file    Past Medical History, Surgical history, Social history, and Family history were reviewed and updated as appropriate.   Please see review of systems for further details on the patient's review from today.   Objective:   Physical Exam:  There were no vitals taken for this visit.  Physical Exam Constitutional:      General: She is not in acute distress.    Appearance: She is well-developed.  Musculoskeletal:        General: No deformity.   Neurological:     Mental Status: She is alert and oriented to person, place, and time.     Coordination: Coordination normal.  Psychiatric:        Attention and Perception: Attention and perception normal. She does not perceive auditory or visual hallucinations.        Mood and Affect: Mood is anxious. Mood is not depressed. Affect is not labile, blunt, angry or inappropriate.        Speech: Speech normal.        Behavior: Behavior normal.        Thought Content: Thought content normal. Thought content is not paranoid or delusional. Thought content does not include homicidal or suicidal ideation. Thought content does not include suicidal plan.        Cognition and Memory: Cognition and memory normal.        Judgment: Judgment normal.     Comments: Depression is improved with the more stable living situation and 20 mg of nortriptyline       Lab Review:     Component Value Date/Time   NA 140 11/11/2015 1005   K 4.6 11/11/2015 1005   CL 102 11/11/2015 1005   CO2 25 11/11/2015 1005   GLUCOSE 93 11/11/2015 1005   BUN 12 11/11/2015 1005   CREATININE 0.71 11/11/2015 1005   CALCIUM 10.1 11/11/2015 1005  PROT 6.6 11/11/2015 1005   ALBUMIN 4.7 11/11/2015 1005   AST 17 11/11/2015 1005   ALT 34 (H) 11/11/2015 1005   ALKPHOS 44 11/11/2015 1005   BILITOT 0.2 11/11/2015 1005   GFRNONAA 96 11/11/2015 1005   GFRAA 110 11/11/2015 1005       Component Value Date/Time   WBC 7.7 07/31/2020 1324   WBC 5.8 09/13/2014 1205   RBC 5.52 (H) 07/31/2020 1324   RBC 4.37 09/13/2014 1205   HGB 17.2 (H) 07/31/2020 1324   HCT 51.2 (H) 07/31/2020 1324   PLT 337 07/31/2020 1324   MCV 93 07/31/2020 1324   MCH 31.2 07/31/2020 1324   MCH 33.9 09/13/2014 1205   MCHC 33.6 07/31/2020 1324   MCHC 35.1 09/13/2014 1205   RDW 12.2 07/31/2020 1324   LYMPHSABS 1.9 07/31/2020 1324   EOSABS 0.1 07/31/2020 1324   BASOSABS 0.1 07/31/2020 1324    No results found for: "POCLITH", "LITHIUM"   No results  found for: "PHENYTOIN", "PHENOBARB", "VALPROATE", "CBMZ"   Genesight testing Jan 31, 2018: SLC 6A4 intermediate response, HDR2A increased sensitivity to adverse effects, CYP enzymes as follows: 1A2 ultra-rapid metabolizer, 2 D6 intermediate metabolizer, U GT1A4 ultra-rapid, The other enzymes were normal.  Folic enzyme conversion was normal  .res Assessment: Plan:    Lucille was seen today for follow-up, depression, post-traumatic stress disorder and other.  Diagnoses and all orders for this visit:  Major depressive disorder, recurrent episode, moderate (HCC) -     nortriptyline (PAMELOR) 25 MG capsule; Take 1 capsule (25 mg total) by mouth at bedtime.  Chronic pain syndrome -     nortriptyline (PAMELOR) 25 MG capsule; Take 1 capsule (25 mg total) by mouth at bedtime.  PTSD (post-traumatic stress disorder) -     nortriptyline (PAMELOR) 25 MG capsule; Take 1 capsule (25 mg total) by mouth at bedtime.  Insomnia due to mental condition    Greater than 50% of 30 min face to face time with patient was spent on counseling and coordination of care. Extremely med sensitive.  Long history of difficulty finding a medication that she could tolerate that would help her.  Overall she felt the [facility] had been a good medicine for her.  But wanted to come off it DT urinary hesitancy 8 2022.  It "definitely" helped with her chronic pain and she believed it is helped with depression as well.  Was ok off of it until the accident in October 2022.  She has tolerated nortriptyline up to 20 mg much better than other antidepressants.  She does not feel that it alters her personality or makes her foggy the way other antidepressants have done in the past.  She is getting some pain benefit from it as well.  Also the concussion symptoms seem to be improving either due to time or other treatment interventions or perhaps being helped by the nortriptyline. She is satisfied with the current dosage would like to switch  to a capsule with possible. So far she is seeing some improvement in pain and she believes depression with low-dose nortriptyline at 10 mL daily but is difficult to fully tell because she is under tremendous stress with this Worker's Compensation case.    We discussed side effects at length and so far she is tolerating it well.   Disc extensiviely .  Disc SE Switch nortriptyline from 10 mL of suspension to 25 mg capsule 1 nightly. She can take trazodone as needed at the low dose that is  typically used for sleep 50 mg.  Disc her post concussion complaiints  FU 3 to 4 months as long as she is doing okay and earlier as needed  Meredith Staggersarey Cottle MD, DFAPA  Please see After Visit Summary for patient specific instructions.  Future Appointments  Date Time Provider Department Center  06/28/2022  2:30 PM [facility]ECHO/VAS 1 [facility] None  06/28/2022  3:00 PM PCV-ECHO/VAS 1 [facility] None  07/22/2022  2:45 PM Patwardhan, Manish J, MD [facility] None     No orders of the defined types were placed in this encounter.    -------------------------------

## 2022-06-18 ENCOUNTER — Telehealth: Payer: Self-pay | Admitting: Psychiatry

## 2022-06-18 NOTE — Telephone Encounter (Signed)
Received Reasonable Accommodation Verifcation Form. Placed in Traci's box 9/22. Please call when ready

## 2022-06-27 NOTE — Telephone Encounter (Signed)
Paper work completed. Will contact pt to pick up on Monday.

## 2022-06-28 ENCOUNTER — Ambulatory Visit: Payer: Medicare Other

## 2022-06-28 DIAGNOSIS — R0989 Other specified symptoms and signs involving the circulatory and respiratory systems: Secondary | ICD-10-CM

## 2022-06-28 DIAGNOSIS — I10 Essential (primary) hypertension: Secondary | ICD-10-CM

## 2022-06-29 NOTE — Progress Notes (Signed)
No answer left a vm

## 2022-07-02 NOTE — Progress Notes (Signed)
Called pt to inform her about her monitor results. Explain to pt if symptoms get severe to start metoprolol BID. Pt mention she would like to wait until she next appt to talk to you about the medication.

## 2022-07-22 ENCOUNTER — Ambulatory Visit: Payer: Medicare Other | Admitting: Cardiology

## 2022-07-22 ENCOUNTER — Encounter: Payer: Self-pay | Admitting: Cardiology

## 2022-07-22 VITALS — BP 140/87 | HR 86 | Resp 16 | Ht 63.0 in | Wt 135.0 lb

## 2022-07-22 DIAGNOSIS — R931 Abnormal findings on diagnostic imaging of heart and coronary circulation: Secondary | ICD-10-CM

## 2022-07-22 DIAGNOSIS — I7300 Raynaud's syndrome without gangrene: Secondary | ICD-10-CM

## 2022-07-22 DIAGNOSIS — R0609 Other forms of dyspnea: Secondary | ICD-10-CM

## 2022-07-22 DIAGNOSIS — E7800 Pure hypercholesterolemia, unspecified: Secondary | ICD-10-CM

## 2022-07-22 DIAGNOSIS — R002 Palpitations: Secondary | ICD-10-CM

## 2022-07-22 DIAGNOSIS — I10 Essential (primary) hypertension: Secondary | ICD-10-CM

## 2022-07-22 MED ORDER — ROSUVASTATIN CALCIUM 5 MG PO TABS: 5.0000 mg | ORAL_TABLET | Freq: Every day | ORAL | 3 refills | Status: DC

## 2022-07-22 MED ORDER — AMLODIPINE BESYLATE 10 MG PO TABS: 10.0000 mg | ORAL_TABLET | Freq: Every evening | ORAL | 3 refills | Status: DC

## 2022-07-22 NOTE — Progress Notes (Signed)
Patient referred by Jola Baptist, PA-C for hypertension  Subjective:   Felicia Wilcox, female    DOB: 06-Sep-1961, 61 y.o.   MRN: [id number]   Chief Complaint  Patient presents with   Palpitations   Follow-up    8 week   HPI  61 y.o. Caucasian female with hypertension, elevated coronary calcium score, OSA, Raynaud's syndrome presents for follow-up and management of hypertension, hyperlipidemia and palpitations.  Patient symptoms include occasional skipped beats at night.  Occasionally with exertional activities.  Again lasting a few seconds.  No dizziness or syncope.  She also complains of gradually worsening dyspnea on exertion.  No associated chest pain.  No PND or orthopnea, no leg edema.  With regard to hypertension, states that her blood pressure is remarkably improved since being on valsartan/amlodipine 320/10 mg combination.  However she has noticed worsening symptoms of Raynaud's since amlodipine was discontinued which was at a higher dose.    Past Medical History:  Diagnosis Date   Anxiety    Arthritis    2 herniated disc lower back   Chronic lower back pain    GERD (gastroesophageal reflux disease)    Sleep apnea    mild - does not use CPAP machine   Smoker    Past Surgical History:  Procedure Laterality Date   Abalation      BACK SURGERY     x 3 - branch block lower back injections -block nerve endings   BARTHOLIN GLAND CYST EXCISION     CESAREAN SECTION     HYSTEROSCOPY N/A 09/13/2014   Procedure: HYSTEROSCOPY / REMOVAL OF INTRAUTERINE DEVICE;  Surgeon: Lahoma Crocker, MD;  Location: [facility];  Service: Gynecology;  Laterality: N/A;  IUD removal   NECK SURGERY     ? C3-4-5 with rods   TONSILLECTOMY     WISDOM TOOTH EXTRACTION     Social History   Tobacco Use  Smoking Status Former   Packs/day: 0.50   Years: 6.00   Total pack years: 3.00   Types: Cigarettes   Quit date: 2019   Years since quitting: 4.8  Smokeless Tobacco Never   Social  History   Substance and Sexual Activity  Alcohol Use No   Alcohol/week: 0.0 standard drinks of alcohol   Comment: Quits 1 month ago.   Family History  Problem Relation Age of Onset   Heart disease Mother    Cancer Father    Stroke Sister    Heart disease Sister    Diabetes Maternal Grandmother    Breast cancer Neg Hx    Allergies  Allergen Reactions   Aspartame And Phenylalanine Anaphylaxis   Duloxetine Hcl Other (See Comments)    Other reaction(s): felt like a zombie Other reaction(s): felt like a zombie    Sertraline Hcl Other (See Comments)   Codeine     "Bleed internally"    Sulfa Antibiotics Nausea And Vomiting     Current Outpatient Medications:    amLODipine (NORVASC) 10 MG tablet, Take 1 tablet (10 mg total) by mouth every evening., Disp: 90 tablet, Rfl: 3   amLODipine-valsartan (EXFORGE) 10-320 MG tablet, Take 1 tablet by mouth daily., Disp: , Rfl:    Cholecalciferol (VITAMIN D3) 125 MCG (5000 UT) CAPS, Take by mouth., Disp: , Rfl:    Erenumab-aooe (AIMOVIG) 140 MG/ML SOAJ, 15 mg when needed, Disp: , Rfl:    nortriptyline (PAMELOR) 25 MG capsule, Take 1 capsule (25 mg total) by mouth at bedtime., Disp: 90  capsule, Rfl: 1   rosuvastatin (CRESTOR) 5 MG tablet, Take 1 tablet (5 mg total) by mouth daily., Disp: 90 tablet, Rfl: 3   terbinafine (LAMISIL) 250 MG tablet, Take 250 mg by mouth daily., Disp: , Rfl:    Cardiovascular and other pertinent studies:  Reviewed external labs and tests, independently interpreted  EKG   05/27/2022: Sinus rhythm 84 bpm. Normal EKG  CT cardiac scoring 10/2020: Total score 172, 91st percentile LM: 0.8 LAD: 85.6 Lcx: 85.2 RCA: 0  Extracardiac findings:  Two small pulmonary nodules, largest measuring 3 mm. These small pulmonary nodules are indeterminate. No follow-up needed if patient is low-risk . 3. Visualized liver has low-attenuation. Findings are suggestive for hepatic steatosis.  ABI 06/28/2022: This exam reveals  normal perfusion of the right lower extremity (ABI 1.08).  This exam reveals normal perfusion of the left lower extremity (ABI 1.08).  Mildly abnormal biphasic waveform pattern at the level of the ankles.  PCV ECHOCARDIOGRAM COMPLETE 06/28/2022  Narrative Echocardiogram 06/28/2022: Normal LV systolic function with visual EF 60-65%. Left ventricle cavity is normal in size. Normal left ventricular wall thickness. Normal global wall motion. Normal diastolic filling pattern, normal LAP. Interatrial septum is redundant and grossly intact by color Doppler but a small PFO cannot be ruled out. No significant valvular abnormalities. No prior study for comparison. If clinically indicated consider limited echocardiogram with bubble study.   Mobile cardiac telemetry 13 days 05/27/2022 - 06/10/2022: Dominant rhythm: Sinus. HR 54-132 bpm. Avg HR 84 bpm, in sinus rhythm. 1 episode of atrial tachycardia at 158 bpm for 4 beats. <1% isolated SVE,  couplet/triplets. 0 episodes of VT. <1% isolated VE, couplets. No atrial fibrillation/atrial flutter/SVT/VT/high grade AV block, sinus pause >3sec noted. 8 patient triggered events, correlated with sinus rhythm/VE.   Recent labs:  No results found for: "CHOL", "HDL", "LDLCALC", "LDLDIRECT", "TRIG", "CHOLHDL"  05/20/2022: Glucose 89, BUN/Cr 14/0.72. EGFR >90. Na/K 138/3.9. Rest of the CMP normal H/H 15/46. MCV 92. Platelets 306 HbA1C 6.1% TSH 2.3normal  07/2021: Chol 158, TG 67, HDL 68, LDL 67  Lipid profile 04/08/2021:  Total cholesterol 234, triglycerides 151, HDL 73, LDL 131.  Non-HDL cholesterol 161.  Review of Systems  Cardiovascular:  Negative for chest pain, dyspnea on exertion, leg swelling, palpitations and syncope.   Vitals:   07/22/22 1441  BP: (!) 140/87  Pulse: 86  Resp: 16  SpO2: 97%   Body mass index is 23.91 kg/m. Filed Weights   07/22/22 1441  Weight: 135 lb (61.2 kg)   Objective:   Physical Exam Vitals and nursing note  reviewed.  Constitutional:      General: She is not in acute distress. Neck:     Vascular: No JVD.  Cardiovascular:     Rate and Rhythm: Normal rate and regular rhythm.     Heart sounds: Normal heart sounds. No murmur heard. Pulmonary:     Effort: Pulmonary effort is normal.     Breath sounds: Normal breath sounds. No wheezing or rales.  Musculoskeletal:     Right lower leg: No edema.     Left lower leg: No edema.    Visit diagnoses:   ICD-10-CM   1. Elevated coronary artery calcium score Feb 2022 showed calcium score of 91st percentile  R93.1 rosuvastatin (CRESTOR) 5 MG tablet    Lipid Panel With LDL/HDL Ratio    Lipoprotein A (LPA)    PCV MYOCARDIAL PERFUSION WO LEXISCAN    2. Dyspnea on exertion  R06.09 PCV MYOCARDIAL PERFUSION  WO LEXISCAN    3. Essential hypertension  I10     4. Palpitations  R00.2     5. Pure hypercholesterolemia  E78.00 rosuvastatin (CRESTOR) 5 MG tablet    Lipid Panel With LDL/HDL Ratio    Lipoprotein A (LPA)    6. Raynaud's disease without gangrene  I73.00 amLODipine (NORVASC) 10 MG tablet     Orders Placed This Encounter  Procedures   Lipid Panel With LDL/HDL Ratio   Lipoprotein A (LPA)   PCV MYOCARDIAL PERFUSION WO LEXISCAN    Assessment & Recommendations:   61 y.o. Caucasian female with hypertension, elevated coronary calcium score, OSA, Raynaud's syndrome presents for follow-up and management of hypertension, hyperlipidemia and palpitations.  Palpitations: Reviewed the results of the cardiac telemetry, her symptoms correlated with PACs and PVCs.  I simply reassured her.  Advised her this is life altering but not life-threatening.  We will hold off on beta-blockers for now in view of Raynaud's exacerbation.  Hypertension: Reviewed echocardiogram.  Normal LV systolic function.  No significant valvular abnormality.  She is presently tolerating valsartan/amlodipine 10/320 mg combination, however her Raynaud's symptoms have gotten worse since  reducing the dose of the amlodipine.  We will add 10 mg of amlodipine in the evening.  Sometimes Entresto doses of calcium channel blockers are needed.  We will also follow-up on her blood pressure, if blood pressure is still >130/80 mmHg, will consider addition of either spironolactone or a different form of diuretic.  Raynaud's syndrome: Normal ABI, please see above note, adding amlodipine as extra dose.  Elevated calcium score: I reviewed her historical data of her lipids, highest LDL that I could discover was 131.  She has responded well to rosuvastatin 5 mg with LDL goal <70.  I have reinitiated the medication.  We will recheck her lipids in 2 months.   Dyspnea on exertion: In view of gradual onset dyspnea on exertion, markedly elevated coronary calcium score, could be anginal equivalent.  We will schedule her for exercise nuclear stress test.  Repeat visit in 2 months after her lipid panel to follow-up on hypertension, hyperlipidemia and dyspnea.  Thank you for referring the patient to [place]. Please feel free to contact with any questions.    Adrian Prows, MD, [facility] 07/22/2022, 10:43 PM Office: [phone] Fax: [phone] Pager: [phone]

## 2022-07-26 ENCOUNTER — Other Ambulatory Visit: Payer: Self-pay

## 2022-07-26 MED ORDER — AMLODIPINE BESYLATE-VALSARTAN 10-320 MG PO TABS: 1.0000 | ORAL_TABLET | Freq: Every day | ORAL | 1 refills | Status: DC

## 2022-07-29 ENCOUNTER — Telehealth: Payer: Self-pay | Admitting: Psychiatry

## 2022-07-29 NOTE — Telephone Encounter (Signed)
Pt called and said that the nortriptyline 25 mg is not as effective as the liquid. So if we are staying on the pill form she would like to increase the dose. Please call her at [phone]

## 2022-07-29 NOTE — Telephone Encounter (Signed)
Patient called to say that the 25 mg tablet of nortriptyline doesn't seem to be working as well as the suspension did. She said the suspension helped with depression and pain much better. She is asking to go back to the liquid.   Take 15 mLs (30 mg total) by mouth at bedtime. Dispense: 473 mL, Refills: 0 ordered  This looks like the last dosing.

## 2022-07-29 NOTE — Telephone Encounter (Signed)
She may be right bc the capsule is 20% lower dose than the liquid.  So with the liquid she was getting 30 mg and now with capsule 25 mg.  Using the capsules we could add 10 mg capsule with the 25 mg capusle to equal 35 mg daily or we can go back to the liquid 30 mg .  Whichever she prefers.

## 2022-07-30 NOTE — Telephone Encounter (Signed)
LVM to [facility] 

## 2022-07-30 NOTE — Telephone Encounter (Signed)
Patient opts for the 10 mg tablet. Told her it would be Monday before it was sent and she said that was fine.

## 2022-08-02 MED ORDER — NORTRIPTYLINE HCL 10 MG PO CAPS: 10.0000 mg | ORAL_CAPSULE | Freq: Every day | ORAL | 0 refills | Status: DC

## 2022-08-02 NOTE — Telephone Encounter (Signed)
Sent!

## 2022-08-17 ENCOUNTER — Ambulatory Visit: Payer: Medicare Other

## 2022-08-17 DIAGNOSIS — R931 Abnormal findings on diagnostic imaging of heart and coronary circulation: Secondary | ICD-10-CM

## 2022-08-17 DIAGNOSIS — R0609 Other forms of dyspnea: Secondary | ICD-10-CM

## 2022-08-22 NOTE — Progress Notes (Signed)
Please let patient know theat I sent her a mychart message. Very mildly abnormal stress and I would prefer to continue present medications and not necessary to proceed with coronary angiogram for now. Ask her to check MyChart Message

## 2022-08-23 NOTE — Progress Notes (Signed)
Called patient, left message regarding results being sent to her MyChart.

## 2022-08-23 NOTE — Progress Notes (Signed)
Called and spoke with patient about stress test results. She acknowledged and had no further questions.

## 2022-08-24 ENCOUNTER — Telehealth: Payer: Self-pay

## 2022-08-24 ENCOUNTER — Other Ambulatory Visit: Payer: Self-pay

## 2022-08-24 NOTE — Telephone Encounter (Signed)
Yes, that is fine. Would prefer her BP to be 130/80 or less. What dose of Amlo/Val? It would have saved time if you only had asked dont you think!!

## 2022-08-24 NOTE — Telephone Encounter (Signed)
Called patient to inform Felicia Wilcox that her PCP changed her bp medication. Patient mention she is now take Amlodipine/benazepril 10/40. Patient was taking Amlodipine/Valsartan. Patient has been on this medication for a month and would like to know if it is ok for her to take it. Patient bp today is 136/86

## 2022-08-25 ENCOUNTER — Other Ambulatory Visit: Payer: Self-pay | Admitting: Psychiatry

## 2022-08-25 NOTE — Telephone Encounter (Signed)
Patient is informed.

## 2022-08-30 ENCOUNTER — Telehealth: Payer: Self-pay | Admitting: Psychiatry

## 2022-08-30 NOTE — Telephone Encounter (Signed)
A RF for 10 mg was sent on 11/29. In September an Rx for #90 x1 was sent, so patient had refills available for both doses at the pharmacy. Notified patient.

## 2022-08-30 NOTE — Telephone Encounter (Signed)
Pt completely out of Nortriptyline 10 mg. [facility] [address]

## 2022-09-27 ENCOUNTER — Other Ambulatory Visit: Payer: Self-pay | Admitting: Psychiatry

## 2022-09-28 ENCOUNTER — Ambulatory Visit (INDEPENDENT_AMBULATORY_CARE_PROVIDER_SITE_OTHER): Payer: Medicare Other | Admitting: Psychiatry

## 2022-09-28 ENCOUNTER — Encounter: Payer: Self-pay | Admitting: Psychiatry

## 2022-09-28 DIAGNOSIS — G894 Chronic pain syndrome: Secondary | ICD-10-CM | POA: Diagnosis not present

## 2022-09-28 DIAGNOSIS — F5105 Insomnia due to other mental disorder: Secondary | ICD-10-CM

## 2022-09-28 DIAGNOSIS — F4310 Post-traumatic stress disorder, unspecified: Secondary | ICD-10-CM

## 2022-09-28 DIAGNOSIS — F331 Major depressive disorder, recurrent, moderate: Secondary | ICD-10-CM | POA: Diagnosis not present

## 2022-09-28 MED ORDER — NORTRIPTYLINE HCL 10 MG PO CAPS: 10.0000 mg | ORAL_CAPSULE | Freq: Every day | ORAL | 0 refills | Status: DC

## 2022-09-28 MED ORDER — NORTRIPTYLINE HCL 25 MG PO CAPS: 25.0000 mg | ORAL_CAPSULE | Freq: Every day | ORAL | 1 refills | Status: DC

## 2022-09-28 NOTE — Telephone Encounter (Signed)
Appt today

## 2022-09-28 NOTE — Progress Notes (Signed)
Felicia Wilcox [id number] [birth year] 62 y.o.  Virtual Visit via Telephone Note  I connected with pt by telephone and verified that I am speaking with the correct person using two identifiers.   I discussed the limitations, risks, security and privacy concerns of performing an evaluation and management service by telephone and the availability of in person appointments. I also discussed with the patient that there may be a patient responsible charge related to this service. The patient expressed understanding and agreed to proceed.  I discussed the assessment and treatment plan with the patient. The patient was provided an opportunity to ask questions and all were answered. The patient agreed with the plan and demonstrated an understanding of the instructions.   The patient was advised to call back or seek an in-person evaluation if the symptoms worsen or if the condition fails to improve as anticipated.  I provided 30 minutes of non-face-to-face time during this encounter.  The patient was located at home and the provider was located office. Session from 230 until 3 PM  Subjective:   Patient ID:  Felicia Wilcox is a 62 y.o. (DOB 10-Oct-1960) female.  Chief Complaint:  Chief Complaint  Patient presents with   Follow-up   Depression   Post-Traumatic Stress Disorder    HPI Felicia Wilcox presents to the office today for follow-up of depression and anxiety and chronic pain problems.    November 2019.  No meds were changed. Continued Fetzima 40mg .   10/2019 appt. Covid free.  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  Stress isolation.  Family far away.  Conflict over politics with friends and family.  Feels like everyone is in the same place.  Not really depression.  Fetzima helps pain. Patient reports stable mood and denies  irritable moods. Little dips into depression and wonders if there is going to be medication options for her given her history of med sensitivity..  Not gracefully aging.   Patient denies any recent difficulty with anxiety.  Patient denies difficulty with sleep initiation or maintenance with hydroxyzine.   Denies appetite disturbance.  Patient reports that energy and motivation have been good.  Patient denies any difficulty with concentration.  Patient denies any suicidal ideation.  Struggles never successful getting into a career.    05/22/21 appt noted: Stayed on Fetzima regularly.  Mirtazapine for sleep once or twice monthly and rare hydroxyzine. Life thrown me a whole lot. B died of alcoholism in 2020/01/06.  She went to take care of things. Got Covid there and lots of family.  Got [employer] and has long haul sx with relentless  medical problems since then.  Brain fog and anxiety at times. Had a lot of medical work up at [place] to take a trial without Fetzima for awhile to see how she feels and also bc of some of the urinary hesitancy. Episodic anxiety and it is better now. Plan: per her request taper Fetzima by taking 20 mg alternating with 40 mg every other day for 2-4 weeks, Then reduce to 20 mg daily for 2-4 weeks, Then reduce to 20 mg every other day for 2 weeks and then stop  11/23/21 appt moved up per her request: Off Fetzima. PT job in [employer] for a couple of years and box fell Jul 16 2021 and gave her concussion and workman's comp and other problems since then.  A lot of physical issues with it. Including migraines and dizziness and tingling in arms and legs.  Hard to  stand for long.   More forgetful.   It has "ruined" me.  Seeing neurologist who gave her some meds.   Has taken trazodone or mirtazapine for sleep.   Workman's comp case denied. Can't do physical activities she did before. Work was good for her physically.  But can't do it the way she did before but needs to work until 62 yo. Is in counseling, Tessa at .  She dx PTSD Has to look for another place to live DT $ px. Plan: No meds RX We discussed the recent accident 06/2021 with concussion  and the problems with getting it resolved.    12/23/2021 phone call: Patient says she is not motivated to do anything, her house is a mess, and she is eating all the time. Her quality of life is poor due to her physical condition. She is inside the majority of the time. She denies SI but says she thinks about not waking up. She feels like the concussion she had in October has caused some neurologic changes. She has migraines and vertigo as a result of the concussion. Her spondylosis doesn't help.  She is taking trazodone from one of her doctors and said she sleeps well. She also uses a CPAP but says she is so tired in the morning. [facility] She said GeneSight testing suggested another medication, but she doesn't remember what it was.  01/19/22 MD repsones: She had the Genesight test may 2019.  Because it is a genetic test that does not change so there is no point to repeating the test.  Unfortunately it does not tell you which medicines are going to work it only suggest which medicines you are likely to have problems tolerating.  She is very med sensitive and has failed multiple medications.  I do not want to change medicines over the phone I need to see her for an appointment before I am going to want to change any medications.  You can put her on the cancellation list if she is not already on it  02/24/2022 appointment with the following noted: Trazodone works for sleep without hangover. Concussion ever changing.  Thinks anxiety is a part of the concussion.   Been through a lot in last couple of years.  Has attorney over the concussion case and is a lot of work. Chronic depression and pain. Plan: She agrees to trial of nortriptyline suspension starting at 2 mg and increasing as tolerated to 10 mg for pain and depression.  03/25/2022 appointment with the following noted: Thinks nortriptyline helping pain and some depression despite "horrific battle" with worker's  comp, MCR etc. Mediation is on July 7.   Feels like she'll collapse any day. No se SE. Vertigo and HA from concussion. On nortriptyline 3 ml (10mg /665ml).  Wonders if she could go up in the dose. Asked if can take Lyrica and nortriptyline. Still needs trazodone for sleep.  No suicidal thoughts. Plan: She agrees to continue trial of nortriptyline suspension increase to 4 mL daily for about 4 days then if tolerated increase to 5 mL daily.  04/26/2022 phone call: She had increased nortriptyline suspension to 6 mL daily.  That was equivalent to 12 mg of nortriptyline.  She wanted to gradually to convert to a capsule so we talked about a plan to gradually increase the liquid until she got to 12 mL nightly at which point we could convert to a capsule. She had some questions about whether nortriptyline  might be affecting her blood pressure and she was encouraged to continue monitoring it.  06/16/2022 appointment noted: Up to nortriptyline 10 ml for a couple of weeks. Tolerating it very well.  No mind fog from it like other antidepressants. Feels it has helped some with the pain and mood is pretty good.  Getting better from concussion finally.  Mirgraines and vertigo less and better function.  Trying to exercise some. Still on Aimovig.  Seeing neuro. Some anxiety chronically.   Life situation is a little more stable and that has helped. SE dryness, but manageable. Not taking trazodone right now. Plan: Switch nortriptyline from 10 mL of suspension to 25 mg capsule 1 nightly. She can take trazodone as needed at the low dose that is typically used for sleep 50 mg.  07/29/22 TC:  Patient called to say that the 25 mg tablet of nortriptyline doesn't seem to be working as well as the suspension did. She said the suspension helped with depression and pain much better. She is asking to go back to the liquid.    Take 15 mLs (30 mg total) by mouth at bedtime. Dispense: 473 mL, Refills: 0 ordered  This looks like  the last dosing.     MD resp:  She may be right bc the capsule is 20% lower dose than the liquid.  So with the liquid she was getting 30 mg and now with capsule 25 mg.  Using the capsules we could add 10 mg capsule with the 25 mg capusle to equal 35 mg daily or we can go back to the liquid 30 mg .  Whichever she prefers.     09/28/22 appt noted: Taking 35 mg nortriptyline daily. Perfect I'm doing great on it.  Pleased with nortriptyline.  Helping her sleep. She quit smoking and also gained some weight and wonders if it is nortriptyline.   Now much better job and better paid.  Not doing physical work.  Is able to exercise  now.  Less tragedy and stress going on now.   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  Works for [employer].   Hard to say how much is related to the med and how much is related to the situation.   Is lonely.  But doesn't want drama either.  Satisfied with meds.   Disability since July 2020 and got Medicaid.  Can only have 22 doctor visits per year.  Chronic $ stress.   Past Psychiatric Medication Trials: Fetzima, duloxetine, sertraline abnormal periods, fluoxetine side effects, paroxetine brief side effects, citalopram, Lexapro 15,, venlafaxine flat,,  Mirtazapine ? effect, nefazodone, buspirone, Wellbutrin no response Nortriptyline 35 mg  Gabapentin Lyrica 100 stoned but helped Hydroxyzine, Ambien, Sonata, trazodone Provigil, lithium brief,  Pindolol,  Chantix Med sensitive.  Review of Systems:  Review of Systems  Constitutional:  Positive for unexpected weight change.       Sweats  Genitourinary:  Negative for difficulty urinating.  Musculoskeletal:  Positive for arthralgias, back pain and neck pain.  Skin:        Hair loss  Neurological:  Negative for dizziness, tremors, weakness and headaches.  Psychiatric/Behavioral:  Negative for decreased concentration.     Medications: I have reviewed the patient's current medications.  Current  Outpatient Medications  Medication Sig Dispense Refill   amLODipine-benazepril (LOTREL) 10-40 MG capsule Take 1 capsule by mouth daily.     Cholecalciferol (VITAMIN D3) 125 MCG (5000 UT) CAPS Take by mouth.  Erenumab-aooe (AIMOVIG) 140 MG/ML SOAJ 15 mg when needed     nortriptyline (PAMELOR) 10 MG capsule Take 1 capsule by mouth at bedtime 30 capsule 0   nortriptyline (PAMELOR) 25 MG capsule Take 1 capsule (25 mg total) by mouth at bedtime. 90 capsule 1   rosuvastatin (CRESTOR) 5 MG tablet Take 1 tablet (5 mg total) by mouth daily. 90 tablet 3   terbinafine (LAMISIL) 250 MG tablet Take 250 mg by mouth daily.     No current facility-administered medications for this visit.    Medication Side Effects: Other: possibly see above  Allergies:  Allergies  Allergen Reactions   Aspartame And Phenylalanine Anaphylaxis   Duloxetine Hcl Other (See Comments)    Other reaction(s): felt like a zombie Other reaction(s): felt like a zombie    Sertraline Hcl Other (See Comments)   Codeine     "Bleed internally"    Sulfa Antibiotics Nausea And Vomiting    Past Medical History:  Diagnosis Date   Anxiety    Arthritis    2 herniated disc lower back   Chronic lower back pain    GERD (gastroesophageal reflux disease)    Sleep apnea    mild - does not use CPAP machine   Smoker     Family History  Problem Relation Age of Onset   Heart disease Mother    Cancer Father    Stroke Sister    Heart disease Sister    Diabetes Maternal Grandmother    Breast cancer Neg Hx     Social History   Socioeconomic History   Marital status: Single    Spouse name: Not on file   Number of children: 1   Years of education: BA   Highest education level: Not on file  Occupational History   Not on file  Tobacco Use   Smoking status: Former    Packs/day: 0.50    Years: 6.00    Total pack years: 3.00    Types: Cigarettes    Quit date: 2019    Years since quitting: 5.0   Smokeless tobacco: Never   Vaping Use   Vaping Use: Former   Quit date: 12/28/2017  Substance and Sexual Activity   Alcohol use: No    Alcohol/week: 0.0 standard drinks of alcohol    Comment: Quits 1 month ago.   Drug use: No   Sexual activity: Not on file  Other Topics Concern   Not on file  Social History Narrative   Drinks 2-3 cups of coffee a day    Social Determinants of Health   Financial Resource Strain: Not on file  Food Insecurity: Not on file  Transportation Needs: Not on file  Physical Activity: Not on file  Stress: Not on file  Social Connections: Not on file  Intimate Partner Violence: Not on file    Past Medical History, Surgical history, Social history, and Family history were reviewed and updated as appropriate.   Please see review of systems for further details on the patient's review from today.   Objective:   Physical Exam:  There were no vitals taken for this visit.  Physical Exam Constitutional:      General: She is not in acute distress.    Appearance: She is well-developed.  Musculoskeletal:        General: No deformity.  Neurological:     Mental Status: She is alert and oriented to person, place, and time.     Coordination: Coordination normal.  Psychiatric:  Attention and Perception: Attention and perception normal. She does not perceive auditory or visual hallucinations.        Mood and Affect: Mood is anxious. Mood is not depressed. Affect is not labile, blunt, angry or inappropriate.        Speech: Speech normal.        Behavior: Behavior normal.        Thought Content: Thought content normal. Thought content is not paranoid or delusional. Thought content does not include homicidal or suicidal ideation. Thought content does not include suicidal plan.        Cognition and Memory: Cognition and memory normal.        Judgment: Judgment normal.     Comments: Depression is improved with the more stable living situation and 35 mg of nortriptyline       Lab  Review:     Component Value Date/Time   NA 140 11/11/2015 1005   K 4.6 11/11/2015 1005   CL 102 11/11/2015 1005   CO2 25 11/11/2015 1005   GLUCOSE 93 11/11/2015 1005   BUN 12 11/11/2015 1005   CREATININE 0.71 11/11/2015 1005   CALCIUM 10.1 11/11/2015 1005   PROT 6.6 11/11/2015 1005   ALBUMIN 4.7 11/11/2015 1005   AST 17 11/11/2015 1005   ALT 34 (H) 11/11/2015 1005   ALKPHOS 44 11/11/2015 1005   BILITOT 0.2 11/11/2015 1005   GFRNONAA 96 11/11/2015 1005   GFRAA 110 11/11/2015 1005       Component Value Date/Time   WBC 7.7 07/31/2020 1324   WBC 5.8 09/13/2014 1205   RBC 5.52 (H) 07/31/2020 1324   RBC 4.37 09/13/2014 1205   HGB 17.2 (H) 07/31/2020 1324   HCT 51.2 (H) 07/31/2020 1324   PLT 337 07/31/2020 1324   MCV 93 07/31/2020 1324   MCH 31.2 07/31/2020 1324   MCH 33.9 09/13/2014 1205   MCHC 33.6 07/31/2020 1324   MCHC 35.1 09/13/2014 1205   RDW 12.2 07/31/2020 1324   LYMPHSABS 1.9 07/31/2020 1324   EOSABS 0.1 07/31/2020 1324   BASOSABS 0.1 07/31/2020 1324    No results found for: "POCLITH", "LITHIUM"   No results found for: "PHENYTOIN", "PHENOBARB", "VALPROATE", "CBMZ"   Genesight testing Jan 31, 2018: SLC 6A4 intermediate response, HDR2A increased sensitivity to adverse effects, CYP enzymes as follows: 1A2 ultra-rapid metabolizer, 2 D6 intermediate metabolizer, U GT1A4 ultra-rapid, The other enzymes were normal.  Folic enzyme conversion was normal  .res Assessment: Plan:    Jaycie was seen today for follow-up, depression and post-traumatic stress disorder.  Diagnoses and all orders for this visit:  Major depressive disorder, recurrent episode, moderate (HCC)  PTSD (post-traumatic stress disorder)  Insomnia due to mental condition  Chronic pain syndrome    Greater than 50% of 30 min face to face time with patient was spent on counseling and coordination of care. Extremely med sensitive.  Long history of difficulty finding a medication that she could  tolerate that would help her.  Doing well with low dose nortriptyline.  She has tolerated nortriptyline much better than other antidepressants.  She does not feel that it alters her personality or makes her foggy the way other antidepressants have done in the past.  She is getting some pain benefit from it as well.  Also the concussion symptoms seem to be improving either due to time or other treatment interventions or perhaps being helped by the nortriptyline. She is satisfied with the current dosage     We discussed  side effects at length and so far she is tolerating it well.   Disc extensiviely .  Disc SE Continue nortriptyline 35 mg HS DT benefit. She can take trazodone as needed at the low dose that is typically used for sleep 50 mg.  Disc her post concussion complaiints  FU 3 to 4 months as long as she is doing okay and earlier as needed  Meredith Staggers MD, DFAPA  Please see After Visit Summary for patient specific instructions.  Future Appointments  Date Time Provider Department Center  09/29/2022  2:30 PM Yates Decamp, MD [facility] None     No orders of the defined types were placed in this encounter.    -------------------------------

## 2022-09-29 ENCOUNTER — Ambulatory Visit: Payer: Medicare Other | Admitting: Cardiology

## 2022-09-29 ENCOUNTER — Encounter: Payer: Self-pay | Admitting: Cardiology

## 2022-09-29 VITALS — BP 118/78 | HR 82 | Resp 16 | Ht 63.0 in | Wt 136.0 lb

## 2022-09-29 DIAGNOSIS — R931 Abnormal findings on diagnostic imaging of heart and coronary circulation: Secondary | ICD-10-CM

## 2022-09-29 DIAGNOSIS — I10 Essential (primary) hypertension: Secondary | ICD-10-CM

## 2022-09-29 DIAGNOSIS — I7300 Raynaud's syndrome without gangrene: Secondary | ICD-10-CM

## 2022-09-29 DIAGNOSIS — R002 Palpitations: Secondary | ICD-10-CM

## 2022-09-29 DIAGNOSIS — E7800 Pure hypercholesterolemia, unspecified: Secondary | ICD-10-CM

## 2022-09-29 NOTE — Progress Notes (Signed)
Patient referred by Jola Baptist, PA-C for hypertension  Subjective:   Felicia Wilcox, female    DOB: 1961-01-07, 62 y.o.   MRN: [id number]   Chief Complaint  Patient presents with   Hypertension   Hyperlipidemia   Palpitations   Follow-up    2 months   HPI  Felicia Wilcox is a 62 y.o. Caucasian female with hypertension, hyperglycemia, elevated coronary calcium score in the 91 percentile, OSA, Raynaud's syndrome presents for follow-up and management of hypertension, hyperlipidemia and palpitations.  She presents here for 39-monthoffice visit.  States that since reducing the dose of the amlodipine symptoms of Raynaud's disease has gotten worse and feels extremely cold in her hands and feet.  Otherwise very mild dyspnea that is chronic, no PND or orthopnea, no leg edema.  She also states that since back pain has resolved, she has not had any more elevation in blood pressure.   Past Medical History:  Diagnosis Date   Anxiety    Arthritis    2 herniated disc lower back   Chronic lower back pain    GERD (gastroesophageal reflux disease)    Hyperlipidemia    Hypertension    Sleep apnea    mild - does not use CPAP machine   Smoker    Past Surgical History:  Procedure Laterality Date   Abalation      BACK SURGERY     x 3 - branch block lower back injections -block nerve endings   BARTHOLIN GLAND CYST EXCISION     CESAREAN SECTION     HYSTEROSCOPY N/A 09/13/2014   Procedure: HYSTEROSCOPY / REMOVAL OF INTRAUTERINE DEVICE;  Surgeon: LLahoma Crocker MD;  [facility];  Service: Gynecology;  Laterality: N/A;  IUD removal   NECK SURGERY     ? C3-4-5 with rods   TONSILLECTOMY     WISDOM TOOTH EXTRACTION     Social History   Tobacco Use  Smoking Status Former   Packs/day: 0.50   Years: 6.00   Total pack years: 3.00   Types: Cigarettes   Quit date: 2019   Years since quitting: 5.0  Smokeless Tobacco Never   Social History   Substance and Sexual Activity   Alcohol Use No   Alcohol/week: 0.0 standard drinks of alcohol   Comment: Quits 1 month ago.   Family History  Problem Relation Age of Onset   Heart disease Mother    Cancer Father    Stroke Sister    Heart disease Sister    Diabetes Maternal Grandmother    Breast cancer Neg Hx    Allergies  Allergen Reactions   Aspartame And Phenylalanine Anaphylaxis   Duloxetine Hcl Other (See Comments)    Other reaction(s): felt like a zombie Other reaction(s): felt like a zombie    Sertraline Hcl Other (See Comments)   Codeine     "Bleed internally"    Sulfa Antibiotics Nausea And Vomiting     Current Outpatient Medications:    amLODipine (NORVASC) 10 MG tablet, Take 1 tablet (10 mg total) by mouth in the morning and at bedtime., Disp: 180 tablet, Rfl: 1   Cholecalciferol (VITAMIN D3) 125 MCG (5000 UT) CAPS, Take by mouth., Disp: , Rfl:    Erenumab-aooe (AIMOVIG) 140 MG/ML SOAJ, 15 mg when needed, Disp: , Rfl:    nortriptyline (PAMELOR) 10 MG capsule, Take 1 capsule (10 mg total) by mouth at bedtime., Disp: 90 capsule, Rfl: 0   nortriptyline (PAMELOR) 25  MG capsule, Take 1 capsule (25 mg total) by mouth at bedtime., Disp: 90 capsule, Rfl: 1   rosuvastatin (CRESTOR) 5 MG tablet, Take 1 tablet (5 mg total) by mouth daily., Disp: 90 tablet, Rfl: 3  EKG   EKG 05/27/2022: Sinus rhythm 84 bpm. Normal EKG  CT cardiac scoring 10/2020: Total score 172, 91st percentile LM: 0.8 LAD: 85.6 Lcx: 85.2 RCA: 0  Extracardiac findings:  Two small pulmonary nodules, largest measuring 3 mm. These small pulmonary nodules are indeterminate. No follow-up needed if patient is low-risk . 3. Visualized liver has low-attenuation. Findings are suggestive for hepatic steatosis.  ABI 06/28/2022: This exam reveals normal perfusion of the right lower extremity (ABI 1.08).  This exam reveals normal perfusion of the left lower extremity (ABI 1.08).  Mildly abnormal biphasic waveform pattern at the level of the  ankles.  PCV ECHOCARDIOGRAM COMPLETE 06/28/2022  Narrative Echocardiogram 06/28/2022: Normal LV systolic function with visual EF 60-65%. Left ventricle cavity is normal in size. Normal left ventricular wall thickness. Normal global wall motion. Normal diastolic filling pattern, normal LAP. Interatrial septum is redundant and grossly intact by color Doppler but a small PFO cannot be ruled out. No significant valvular abnormalities. No prior study for comparison. If clinically indicated consider limited echocardiogram with bubble study.   Mobile cardiac telemetry 13 days 05/27/2022 - 06/10/2022: Dominant rhythm: Sinus. HR 54-132 bpm. Avg HR 84 bpm, in sinus rhythm. 1 episode of atrial tachycardia at 158 bpm for 4 beats. <1% isolated SVE,  couplet/triplets. 0 episodes of VT. <1% isolated VE, couplets. No atrial fibrillation/atrial flutter/SVT/VT/high grade AV block, sinus pause >3sec noted. 8 patient triggered events, correlated with sinus rhythm/VE.  PCV MYOCARDIAL PERFUSION WO LEXISCAN 08/17/2022  Narrative Lexiscan (with Bruce protocol) Nuclear stress test 08/17/2022: There is a small sized very mild reversible defect in the basal inferior-lateral region. Overall LV systolic function is normal without regional wall motion abnormalities. Stress LV EF: 63%. Non-diagnostic ECG stress due inability to achieve THR. Hence test converted to [facility] protocol. Patient exercised on a Bruce protocol. Achieved 71% of MPHR. The patient exercised for 6 minutes and 20 seconds achieving approximately 7.54 METs. Normal BP response. No previous exam available for comparison. Low risk study.   Recent labs:  Labs 08/13/2021:  Total cholesterol 158, triglycerides 116, HDL 67, LDL 68.  Non-HDL cholesterol 91.  05/20/2022: Glucose 89, BUN/Cr 14/0.72. EGFR >90. Na/K 138/3.9. Rest of the CMP normal H/H 15/46. MCV 92. Platelets 306 HbA1C 6.1% TSH 2.3normal  Review of Systems  Cardiovascular:   Negative for chest pain, dyspnea on exertion, leg swelling, palpitations and syncope.   Vitals:   09/29/22 1429 09/29/22 1432  BP: 136/86 118/78  Pulse: 85 82  Resp: 16   SpO2: 98% 100%   Body mass index is 24.09 kg/m. Filed Weights   09/29/22 1429  Weight: 136 lb (61.7 kg)   Objective:   Physical Exam Vitals and nursing note reviewed.  Constitutional:      General: She is not in acute distress. Neck:     Vascular: No JVD.  Cardiovascular:     Rate and Rhythm: Normal rate and regular rhythm.     Heart sounds: Normal heart sounds. No murmur heard. Pulmonary:     Effort: Pulmonary effort is normal.     Breath sounds: Normal breath sounds. No wheezing or rales.  Musculoskeletal:     Right lower leg: No edema.     Left lower leg: No edema.    Visit  diagnoses:   ICD-10-CM   1. Elevated coronary artery calcium score Feb 2022 showed calcium score of 91st percentile  R93.1     2. Essential hypertension  I10 amLODipine (NORVASC) 10 MG tablet    3. Pure hypercholesterolemia  E78.00     4. Palpitations  R00.2     5. Raynaud's disease without gangrene  I73.00 amLODipine (NORVASC) 10 MG tablet     No orders of the defined types were placed in this encounter.   Assessment & Recommendations:   Felicia Wilcox is a 62 y.o. Caucasian female with hypertension, hyperglycemia, elevated coronary calcium score in the 91 percentile, OSA, Raynaud's syndrome presents for follow-up and management of hypertension, hyperlipidemia and palpitations.  1. Elevated coronary artery calcium score Feb 2022 showed calcium score of 91st percentile Patient with markedly elevated coronary calcium score and now has an abnormal nuclear stress test revealing mild small sized basal inferolateral ischemia.  However she is asymptomatic, I have discussed extensively with the patient regarding primary prevention versus secondary prevention and indications for coronary angiography.  She is without anginal  symptoms, hence continue medical therapy is indicated.  She is not a diabetic.  2. Essential hypertension Blood pressure is very well-controlled.  However she is still having significant amount of Raynaud's phenomenon in her hands with cold extremity, on amlodipine 10 mg twice daily she is feeling much better.  As her blood pressure is very soft, will discontinue amlodipine/benazepril 10/40 mg daily and switch her to amlodipine 10 mg twice daily.  To treat Raynaud's phenomena, we will need industrial doses of calcium channel blocker.  3. Pure hypercholesterolemia She needs lipid profile testing, goal LDL <70 if possible closer to 55.  This is in view of high coronary calcium score and abnormal nuclear stress test.  4. Palpitations Symptoms of palpitations have essentially resolved.  She has occasional palpitation but she is not concerned about these.  Office visit in 6 months or sooner if problems, if she remains stable I will see her back on a as needed basis.     Adrian Prows, MD, [facility] 09/29/2022, 4:36 PM Office: [phone] Fax: [phone] Pager: [phone]

## 2022-10-06 LAB — LIPID PANEL WITH LDL/HDL RATIO
Cholesterol, Total: 179 mg/dL (ref 100–199)
HDL: 64 mg/dL (ref >39)
LDL Chol Calc (NIH): 98 mg/dL (ref 0–99)
LDL/HDL Ratio: 1.5 ratio (ref 0.0–3.2)
Triglycerides: 92 mg/dL (ref 0–149)
VLDL Cholesterol Cal: 17 mg/dL (ref 5–40)

## 2022-10-06 LAB — LIPOPROTEIN A (LPA): Lipoprotein (a): 9.2 nmol/L (ref <75.0)

## 2022-10-11 NOTE — Progress Notes (Signed)
  I would like to have your LDL which is the bad cholesterol to at least <70, presently 98.  Previously your cholesterol was excellent.  Labs 08/13/2021:  Total cholesterol 158, triglycerides 116, HDL 67, LDL 68.  Non-HDL cholesterol 91.   We could certainly increase Crestor (rosuvastatin) to either 10 or 20 mg daily and see how you do, please let me know.

## 2022-10-12 ENCOUNTER — Other Ambulatory Visit: Payer: Self-pay | Admitting: Cardiology

## 2022-10-12 ENCOUNTER — Telehealth: Payer: Self-pay

## 2022-10-12 DIAGNOSIS — E7800 Pure hypercholesterolemia, unspecified: Secondary | ICD-10-CM

## 2022-10-12 DIAGNOSIS — R931 Abnormal findings on diagnostic imaging of heart and coronary circulation: Secondary | ICD-10-CM

## 2022-10-12 MED ORDER — AMLODIPINE BESY-BENAZEPRIL HCL 5-20 MG PO CAPS: 1.0000 | ORAL_CAPSULE | Freq: Every day | ORAL | 1 refills | Status: DC

## 2022-10-12 MED ORDER — ROSUVASTATIN CALCIUM 10 MG PO TABS: 10.0000 mg | ORAL_TABLET | Freq: Every day | ORAL | 3 refills | Status: DC

## 2022-10-12 NOTE — Telephone Encounter (Signed)
Patient calling with complaints of swelling of legs. She said this hasn't happened before, but began when she stopped amlodipine-benazepril. Left leg is worse than the right leg, she is concerned because of the pitting. No other symptoms, no difficulty walking.   Please advise and I will call her back. Call back # [phone]

## 2022-10-12 NOTE — Telephone Encounter (Signed)
I had sent an earlier message to you guys.  I would like to have your LDL which is the bad cholesterol to at least <70, presently 98.  Previously your cholesterol was excellent.  Labs 08/13/2021:  Total cholesterol 158, triglycerides 116, HDL 67, LDL 68.  Non-HDL cholesterol 91.   We could certainly increase Crestor (rosuvastatin) to either 10 or 20 mg daily and see how you do, please let me know.   With regards to leg edema, it is related to Amlodipine.  I had increased her amlodipine due to Raynaud's, but if she prefers she can go back to her old drug Amlodipine/Benazepril 5/20 mg daily.

## 2022-10-12 NOTE — Addendum Note (Signed)
Addended by: Kela Millin on: 10/12/2022 04:54 PM   Modules accepted: Orders

## 2022-10-12 NOTE — Progress Notes (Addendum)
Called patient to inform her about her lab results. Patient mention she would like to try 10 mg and she alos mention she has had ankles, feet and knee swollen since last night. Patient denies SOB, difficulty walking. Patient mention her bp is high. 148/88 was his bp this morning.     ICD-10-CM   1. Elevated coronary artery calcium score Feb 2022 showed calcium score of 91st percentile  R93.1 Lipid Panel With LDL/HDL Ratio    Lipoprotein A (LPA)    PCV MYOCARDIAL PERFUSION WO LEXISCAN    rosuvastatin (CRESTOR) 10 MG tablet    DISCONTINUED: rosuvastatin (CRESTOR) 5 MG tablet    2. Dyspnea on exertion  R06.09 PCV MYOCARDIAL PERFUSION WO LEXISCAN    3. Essential hypertension  I10 amLODipine-benazepril (LOTREL) 5-20 MG capsule    4. Palpitations  R00.2     5. Pure hypercholesterolemia  E78.00 Lipid Panel With LDL/HDL Ratio    Lipoprotein A (LPA)    rosuvastatin (CRESTOR) 10 MG tablet    DISCONTINUED: rosuvastatin (CRESTOR) 5 MG tablet    6. Raynaud's disease without gangrene  I73.00 DISCONTINUED: amLODipine (NORVASC) 10 MG tablet     Meds ordered this encounter  Medications   DISCONTD: rosuvastatin (CRESTOR) 5 MG tablet    Sig: Take 1 tablet (5 mg total) by mouth daily.    Dispense:  90 tablet    Refill:  3   DISCONTD: amLODipine (NORVASC) 10 MG tablet    Sig: Take 1 tablet (10 mg total) by mouth every evening.    Dispense:  90 tablet    Refill:  3   rosuvastatin (CRESTOR) 10 MG tablet    Sig: Take 1 tablet (10 mg total) by mouth daily.    Dispense:  90 tablet    Refill:  3   amLODipine-benazepril (LOTREL) 5-20 MG capsule    Sig: Take 1 capsule by mouth daily.    Dispense:  90 capsule    Refill:  1    Discontinued amlodipine due to leg edema and restarted her back on Lotrel that she was on previously.

## 2022-10-15 ENCOUNTER — Encounter: Payer: Self-pay | Admitting: Cardiology

## 2022-10-15 NOTE — Telephone Encounter (Signed)
Tuesday afternoon please schedule. I sent her mychart message

## 2022-10-15 NOTE — Telephone Encounter (Signed)
Patient states that her feet are still very swollen.She is taking Amlodipine/Benazepril 5/20 mg daily. She feels that this is giving her a cough. Can she use nasal spray Oxymetazoline HCI 0.05% for the cough? Patient is also asking do you want her to come in for a appointment today?

## 2022-10-18 NOTE — Telephone Encounter (Signed)
Tuesday afternoon please schedule. I sent her mychart message

## 2022-10-19 ENCOUNTER — Ambulatory Visit: Payer: Medicare Other | Admitting: Cardiology

## 2022-10-19 NOTE — Progress Notes (Signed)
Patient referred by Jola Baptist, PA-C for hypertension  Subjective:   Felicia Wilcox, female    DOB: 12-20-1960, 62 y.o.   MRN: [id number]   No chief complaint on file.  HPI  Felicia Wilcox is a 62 y.o. Caucasian female with hypertension, hyperglycemia, elevated coronary calcium score in the 91 percentile, OSA, Raynaud's syndrome presents for follow-up and management of hypertension, hyperlipidemia and palpitations.  She presents here for 78-month office visit.  States that since reducing the dose of the amlodipine symptoms of Raynaud's disease has gotten worse and feels extremely cold in her hands and feet.  Otherwise very mild dyspnea that is chronic, no PND or orthopnea, no leg edema.  She also states that since back pain has resolved, she has not had any more elevation in blood pressure.   Past Medical History:  Diagnosis Date  . Anxiety   . Arthritis    2 herniated disc lower back  . Chronic lower back pain   . GERD (gastroesophageal reflux disease)   . Hyperlipidemia   . Hypertension   . Sleep apnea    mild - does not use CPAP machine  . Smoker    Past Surgical History:  Procedure Laterality Date  . Abalation     . BACK SURGERY     x 3 - branch block lower back injections -block nerve endings  . BARTHOLIN GLAND CYST EXCISION    . CESAREAN SECTION    . HYSTEROSCOPY N/A 09/13/2014   Procedure: HYSTEROSCOPY / REMOVAL OF INTRAUTERINE DEVICE;  Surgeon: Lahoma Crocker, MD;  Location: [facility];  Service: Gynecology;  Laterality: N/A;  IUD removal  . NECK SURGERY     ? C3-4-5 with rods  . TONSILLECTOMY    . WISDOM TOOTH EXTRACTION     Social History   Tobacco Use  Smoking Status Former  . Packs/day: 0.50  . Years: 6.00  . Total pack years: 3.00  . Types: Cigarettes  . Quit date: 2019  . Years since quitting: 5.0  Smokeless Tobacco Never   Social History   Substance and Sexual Activity  Alcohol Use No  . Alcohol/week: 0.0 standard drinks of alcohol    Comment: Quits 1 month ago.   Family History  Problem Relation Age of Onset  . Heart disease Mother   . Cancer Father   . Stroke Sister   . Heart disease Sister   . Diabetes Maternal Grandmother   . Breast cancer Neg Hx    Allergies  Allergen Reactions  . Aspartame And Phenylalanine Anaphylaxis  . Duloxetine Hcl Other (See Comments)    Other reaction(s): felt like a zombie Other reaction(s): felt like a zombie   . Sertraline Hcl Other (See Comments)  . Codeine     "Bleed internally"   . Sulfa Antibiotics Nausea And Vomiting     Current Outpatient Medications:  .  amLODipine-benazepril (LOTREL) 5-20 MG capsule, Take 1 capsule by mouth daily., Disp: 90 capsule, Rfl: 1 .  Cholecalciferol (VITAMIN D3) 125 MCG (5000 UT) CAPS, Take by mouth., Disp: , Rfl:  .  Erenumab-aooe (AIMOVIG) 140 MG/ML SOAJ, 15 mg when needed, Disp: , Rfl:  .  nortriptyline (PAMELOR) 10 MG capsule, Take 1 capsule (10 mg total) by mouth at bedtime., Disp: 90 capsule, Rfl: 0 .  nortriptyline (PAMELOR) 25 MG capsule, Take 1 capsule (25 mg total) by mouth at bedtime., Disp: 90 capsule, Rfl: 1 .  rosuvastatin (CRESTOR) 10 MG tablet, Take 1 tablet (  10 mg total) by mouth daily., Disp: 90 tablet, Rfl: 3  EKG   EKG 05/27/2022: Sinus rhythm 84 bpm. Normal EKG  CT cardiac scoring 10/2020: Total score 172, 91st percentile LM: 0.8 LAD: 85.6 Lcx: 85.2 RCA: 0  Extracardiac findings:  Two small pulmonary nodules, largest measuring 3 mm. These small pulmonary nodules are indeterminate. No follow-up needed if patient is low-risk . 3. Visualized liver has low-attenuation. Findings are suggestive for hepatic steatosis.  ABI 06/28/2022: This exam reveals normal perfusion of the right lower extremity (ABI 1.08).  This exam reveals normal perfusion of the left lower extremity (ABI 1.08).  Mildly abnormal biphasic waveform pattern at the level of the ankles.  PCV ECHOCARDIOGRAM COMPLETE  06/28/2022  Narrative Echocardiogram 06/28/2022: Normal LV systolic function with visual EF 60-65%. Left ventricle cavity is normal in size. Normal left ventricular wall thickness. Normal global wall motion. Normal diastolic filling pattern, normal LAP. Interatrial septum is redundant and grossly intact by color Doppler but a small PFO cannot be ruled out. No significant valvular abnormalities. No prior study for comparison. If clinically indicated consider limited echocardiogram with bubble study.   Mobile cardiac telemetry 13 days 05/27/2022 - 06/10/2022: Dominant rhythm: Sinus. HR 54-132 bpm. Avg HR 84 bpm, in sinus rhythm. 1 episode of atrial tachycardia at 158 bpm for 4 beats. <1% isolated SVE,  couplet/triplets. 0 episodes of VT. <1% isolated VE, couplets. No atrial fibrillation/atrial flutter/SVT/VT/high grade AV block, sinus pause >3sec noted. 8 patient triggered events, correlated with sinus rhythm/VE.  PCV MYOCARDIAL PERFUSION WO LEXISCAN 08/17/2022  Narrative Lexiscan (with Bruce protocol) Nuclear stress test 08/17/2022: There is a small sized very mild reversible defect in the basal inferior-lateral region. Overall LV systolic function is normal without regional wall motion abnormalities. Stress LV EF: 63%. Non-diagnostic ECG stress due inability to achieve THR. Hence test converted to [facility] protocol. Patient exercised on a Bruce protocol. Achieved 71% of MPHR. The patient exercised for 6 minutes and 20 seconds achieving approximately 7.54 METs. Normal BP response. No previous exam available for comparison. Low risk study.   Recent labs:  Lab Results  Component Value Date   CHOL 179 10/05/2022   HDL 64 10/05/2022   LDLCALC 98 10/05/2022   TRIG 92 10/05/2022    Labs 08/13/2021:  Total cholesterol 158, triglycerides 116, HDL 67, LDL 68.  Non-HDL cholesterol 91.  05/20/2022: Glucose 89, BUN/Cr 14/0.72. EGFR >90. Na/K 138/3.9. Rest of the CMP normal H/H 15/46. MCV  92. Platelets 306 HbA1C 6.1% TSH 2.3normal  Review of Systems  Cardiovascular:  Negative for chest pain, dyspnea on exertion, leg swelling, palpitations and syncope.   There were no vitals filed for this visit.  There is no height or weight on file to calculate BMI. There were no vitals filed for this visit.  Objective:   Physical Exam Vitals and nursing note reviewed.  Constitutional:      General: She is not in acute distress. Neck:     Vascular: No JVD.  Cardiovascular:     Rate and Rhythm: Normal rate and regular rhythm.     Heart sounds: Normal heart sounds. No murmur heard. Pulmonary:     Effort: Pulmonary effort is normal.     Breath sounds: Normal breath sounds. No wheezing or rales.  Musculoskeletal:     Right lower leg: No edema.     Left lower leg: No edema.   Visit diagnoses:   ICD-10-CM   1. Elevated coronary artery calcium score Feb 2022 showed  calcium score of 91st percentile  R93.1     2. Pure hypercholesterolemia  E78.00     3. Raynaud's disease without gangrene  I73.00     4. Essential hypertension  I10      No orders of the defined types were placed in this encounter.   Assessment & Recommendations:   Felicia Wilcox is a 62 y.o. Caucasian female with hypertension, hyperglycemia, elevated coronary calcium score in the 91 percentile, OSA, Raynaud's syndrome presents for follow-up and management of hypertension, hyperlipidemia and palpitations.  1. Elevated coronary artery calcium score Feb 2022 showed calcium score of 91st percentile Patient with markedly elevated coronary calcium score and now has an abnormal nuclear stress test revealing mild small sized basal inferolateral ischemia.  However she is asymptomatic, I have discussed extensively with the patient regarding primary prevention versus secondary prevention and indications for coronary angiography.  She is without anginal symptoms, hence continue medical therapy is indicated.  She is not a  diabetic.  2. Essential hypertension Blood pressure is very well-controlled.  However she is still having significant amount of Raynaud's phenomenon in her hands with cold extremity, on amlodipine 10 mg twice daily she is feeling much better.  As her blood pressure is very soft, will discontinue amlodipine/benazepril 10/40 mg daily and switch her to amlodipine 10 mg twice daily.  To treat Raynaud's phenomena, we will need industrial doses of calcium channel blocker.  3. Pure hypercholesterolemia She needs lipid profile testing, goal LDL <70 if possible closer to 55.  This is in view of high coronary calcium score and abnormal nuclear stress test.  4. Palpitations Symptoms of palpitations have essentially resolved.  She has occasional palpitation but she is not concerned about these.  Office visit in 6 months or sooner if problems, if she remains stable I will see her back on a as needed basis.     Adrian Prows, MD, [facility] 10/19/2022, 2:09 PM Office: [phone] Fax: [phone] Pager: [phone]

## 2022-10-26 ENCOUNTER — Telehealth: Payer: Self-pay

## 2022-10-26 NOTE — Telephone Encounter (Signed)
I had set her for an  ov and she was no show.

## 2022-10-26 NOTE — Telephone Encounter (Signed)
Patient complains of swelling of lower legs, knees and ankles. She is also having some facial and stomach swelling. Bp is 140/90. She is taking Amlodipine-benazepril 5-20 in the morning and also taking amLODipine (NORVASC) 10 MG tablet  at bedtime.  I advised patient of the mychart communication that was sent, she says she has no access to [employer]. Patient says she did not know that she was scheduled an appointment with you and no- showed.

## 2022-10-27 ENCOUNTER — Telehealth: Payer: Self-pay

## 2022-10-27 NOTE — Telephone Encounter (Signed)
Spoke with patient to let her know that Dr. Einar Gip wants her to stop her Amlodipine 10mg . Patient verbalized understanding. He will discuss this further at her upcoming visit.

## 2022-10-28 ENCOUNTER — Encounter: Payer: Self-pay | Admitting: Cardiology

## 2022-10-28 ENCOUNTER — Ambulatory Visit: Payer: Medicare Other | Admitting: Cardiology

## 2022-10-28 VITALS — BP 141/91 | HR 86 | Resp 16 | Ht 63.0 in | Wt 137.8 lb

## 2022-10-28 DIAGNOSIS — R931 Abnormal findings on diagnostic imaging of heart and coronary circulation: Secondary | ICD-10-CM

## 2022-10-28 DIAGNOSIS — I10 Essential (primary) hypertension: Secondary | ICD-10-CM

## 2022-10-28 DIAGNOSIS — R600 Localized edema: Secondary | ICD-10-CM

## 2022-10-28 DIAGNOSIS — I7300 Raynaud's syndrome without gangrene: Secondary | ICD-10-CM

## 2022-10-28 DIAGNOSIS — E7800 Pure hypercholesterolemia, unspecified: Secondary | ICD-10-CM

## 2022-10-28 MED ORDER — SPIRONOLACTONE 25 MG PO TABS: 25.0000 mg | ORAL_TABLET | ORAL | 2 refills | Status: DC

## 2022-10-28 NOTE — Progress Notes (Signed)
Patient referred by Jola Baptist, PA-C for hypertension  Subjective:   Felicia Wilcox, female    DOB: 1961-06-10, 62 y.o.   MRN: [id number]   Chief Complaint  Patient presents with   Raynaud's disease without gangrene   Bilateral leg edema   HPI  Felicia Wilcox is a 62 y.o. Caucasian female with hypertension, hyperglycemia, elevated coronary calcium score in the 91 percentile, OSA, Raynaud's syndrome, I had increased the dose of amlodipine, she was previously on amlodipine 5 mg along with benazepril 20 mg which she was tolerating, after I had added additional 10 mg amlodipine, she has developed mild fluid overload state, abdominal distention and constipation as a severe side effect of amlodipine.  She now presents to discuss medication use and her symptoms.  Otherwise very mild dyspnea that is chronic, no PND or orthopnea, no leg edema.  She also states that since back pain has resolved, she has not had any more elevation in blood pressure.  Symptoms of Raynaud's are slowly getting worse with cold hands.  No gangrene.   Past Medical History:  Diagnosis Date   Anxiety    Arthritis    2 herniated disc lower back   Chronic lower back pain    GERD (gastroesophageal reflux disease)    Hyperlipidemia    Hypertension    Sleep apnea    mild - does not use CPAP machine   Smoker    Past Surgical History:  Procedure Laterality Date   Abalation      BACK SURGERY     x 3 - branch block lower back injections -block nerve endings   BARTHOLIN GLAND CYST EXCISION     CESAREAN SECTION     HYSTEROSCOPY N/A 09/13/2014   Procedure: HYSTEROSCOPY / REMOVAL OF INTRAUTERINE DEVICE;  Surgeon: Lahoma Crocker, MD;  Location: [facility];  Service: Gynecology;  Laterality: N/A;  IUD removal   NECK SURGERY     ? C3-4-5 with rods   TONSILLECTOMY     WISDOM TOOTH EXTRACTION     Social History   Tobacco Use  Smoking Status Former   Packs/day: 0.50   Years: 6.00   Total pack years: 3.00    Types: Cigarettes   Quit date: 2019   Years since quitting: 5.0  Smokeless Tobacco Never   Social History   Substance and Sexual Activity  Alcohol Use No   Alcohol/week: 0.0 standard drinks of alcohol   Comment: Quits 1 month ago.   Family History  Problem Relation Age of Onset   Heart disease Mother    Cancer Father    Stroke Sister    Heart disease Sister    Diabetes Maternal Grandmother    Breast cancer Neg Hx    Allergies  Allergen Reactions   Aspartame And Phenylalanine Anaphylaxis   Duloxetine Hcl Other (See Comments)    Other reaction(s): felt like a zombie Other reaction(s): felt like a zombie    Sertraline Hcl Other (See Comments)   Codeine     "Bleed internally"    Sulfa Antibiotics Nausea And Vomiting     Current Outpatient Medications:    amLODipine-benazepril (LOTREL) 5-20 MG capsule, Take 1 capsule by mouth daily., Disp: 90 capsule, Rfl: 1   Cholecalciferol (VITAMIN D3) 125 MCG (5000 UT) CAPS, Take by mouth., Disp: , Rfl:    donepezil (ARICEPT) 5 MG tablet, Take 5 mg by mouth daily., Disp: , Rfl:    nortriptyline (PAMELOR) 10 MG capsule, Take 1  capsule (10 mg total) by mouth at bedtime., Disp: 90 capsule, Rfl: 0   nortriptyline (PAMELOR) 25 MG capsule, Take 1 capsule (25 mg total) by mouth at bedtime., Disp: 90 capsule, Rfl: 1   rosuvastatin (CRESTOR) 10 MG tablet, Take 1 tablet (10 mg total) by mouth daily., Disp: 90 tablet, Rfl: 3   spironolactone (ALDACTONE) 25 MG tablet, Take 1 tablet (25 mg total) by mouth every morning., Disp: 30 tablet, Rfl: 2   omeprazole (PRILOSEC) 40 MG capsule, Take 40 mg by mouth every morning. (Patient not taking: Reported on 10/28/2022), Disp: , Rfl:   EKG   CT cardiac scoring 10/2020: Total score 172, 91st percentile LM: 0.8 LAD: 85.6 Lcx: 85.2 RCA: 0  Extracardiac findings:  Two small pulmonary nodules, largest measuring 3 mm. These small pulmonary nodules are indeterminate. No follow-up needed if patient is low-risk  . 3. Visualized liver has low-attenuation. Findings are suggestive for hepatic steatosis.  ABI 06/28/2022: This exam reveals normal perfusion of the right lower extremity (ABI 1.08).  This exam reveals normal perfusion of the left lower extremity (ABI 1.08).  Mildly abnormal biphasic waveform pattern at the level of the ankles.  PCV ECHOCARDIOGRAM COMPLETE 06/28/2022  Narrative Echocardiogram 06/28/2022: Normal LV systolic function with visual EF 60-65%. Left ventricle cavity is normal in size. Normal left ventricular wall thickness. Normal global wall motion. Normal diastolic filling pattern, normal LAP. Interatrial septum is redundant and grossly intact by color Doppler but a small PFO cannot be ruled out. No significant valvular abnormalities. No prior study for comparison. If clinically indicated consider limited echocardiogram with bubble study.   Mobile cardiac telemetry 13 days 05/27/2022 - 06/10/2022: Dominant rhythm: Sinus. HR 54-132 bpm. Avg HR 84 bpm, in sinus rhythm. 1 episode of atrial tachycardia at 158 bpm for 4 beats. <1% isolated SVE,  couplet/triplets. 0 episodes of VT. <1% isolated VE, couplets. No atrial fibrillation/atrial flutter/SVT/VT/high grade AV block, sinus pause >3sec noted. 8 patient triggered events, correlated with sinus rhythm/VE.  PCV MYOCARDIAL PERFUSION WO LEXISCAN 08/17/2022  Narrative Lexiscan (with Bruce protocol) Nuclear stress test 08/17/2022: There is a small sized very mild reversible defect in the basal inferior-lateral region. Overall LV systolic function is normal without regional wall motion abnormalities. Stress LV EF: 63%. Non-diagnostic ECG stress due inability to achieve THR. Hence test converted to [facility] protocol. Patient exercised on a Bruce protocol. Achieved 71% of MPHR. The patient exercised for 6 minutes and 20 seconds achieving approximately 7.54 METs. Normal BP response. No previous exam available for comparison. Low risk  study.  EKG:   EKG 10/28/2022: Normal sinus rhythm at rate of 84 bpm, normal axis, no evidence of ischemia, normal EKG.  Compared to 05/27/2022, no change.   Recent labs:  Lab Results  Component Value Date   CHOL 179 10/05/2022   HDL 64 10/05/2022   LDLCALC 98 10/05/2022   TRIG 92 10/05/2022    Labs 08/13/2021:  Total cholesterol 158, triglycerides 116, HDL 67, LDL 68.  Non-HDL cholesterol 91.  05/20/2022: Glucose 89, BUN/Cr 14/0.72. EGFR >90. Na/K 138/3.9. Rest of the CMP normal H/H 15/46. MCV 92. Platelets 306 HbA1C 6.1% TSH 2.3normal  Review of Systems  Cardiovascular:  Positive for dyspnea on exertion and leg swelling. Negative for chest pain.  Gastrointestinal:  Positive for bloating.   Vitals:   10/28/22 1431  BP: (!) 141/91  Pulse: 86  Resp: 16  SpO2: 97%   Body mass index is 24.41 kg/m. Filed Weights   10/28/22 1431  Weight: 137 lb 12.8 oz (62.5 kg)   Objective:   Physical Exam Neck:     Vascular: No carotid bruit or JVD.  Cardiovascular:     Rate and Rhythm: Normal rate and regular rhythm.     Pulses: Intact distal pulses.     Heart sounds: Normal heart sounds. No murmur heard.    No gallop.  Pulmonary:     Effort: Pulmonary effort is normal.     Breath sounds: Normal breath sounds.  Abdominal:     General: Bowel sounds are normal.     Palpations: Abdomen is soft.     Comments: Mild distention of the abdomen present.  Musculoskeletal:     Right lower leg: Edema (2+ below-knee pitting) present.     Left lower leg: Edema (2+ below-knee pitting) present.    Visit diagnoses:   ICD-10-CM   1. Raynaud's disease without gangrene  I73.00     2. Bilateral leg edema  R60.0 EKG 12-Lead    3. Essential hypertension  I10 spironolactone (ALDACTONE) 25 MG tablet    Basic metabolic panel    4. Elevated coronary artery calcium score Feb 2022 showed calcium score of 91st percentile  R93.1     5. Pure hypercholesterolemia  E78.00      Orders Placed This  Encounter  Procedures   Basic metabolic panel   EKG [id number]    Assessment & Recommendations:   Felicia Wilcox is a 62 y.o. Caucasian female with hypertension, hyperglycemia, elevated coronary calcium score in the 91 percentile, OSA, Raynaud's syndrome presents for follow-up and management of hypertension, hyperlipidemia and palpitations.  1. Raynaud's disease without gangrene Patient with Raynaud's disease, I had increased the dose of amlodipine, she was previously on amlodipine 5 mg along with benazepril 20 mg which she was tolerating, after I had added additional 10 mg amlodipine, she has developed mild fluid overload state, abdominal distention and constipation as a severe side effect of amlodipine.  Hence this was discontinued yesterday.  I will consider a different vasodilator drug once she improves with her symptomatology as she is presently volume overloaded and feels poorly.  2. Bilateral leg edema I have prescribed her spironolactone 25 mg in the morning.  Will obtain BMP in 2 weeks.  Hopefully with this edema will improve and also blood pressure will be improved as well.  Advised her to keep her feet elevated, not to force herself with excess fluid and also to avoid salty food.  3. Essential hypertension Blood pressure is elevated today, with addition of spironolactone hopefully blood pressure will also improve.  I will continue to see her on a very close fashion, in 4 weeks.  4. Elevated coronary artery calcium score Feb 2022 showed calcium score of 91st percentile Patient's coronary calcium score in the 91st percentile, presently on Crestor 10 mg daily.  She would certainly need improvement in her lipids, LDL is not at goal, <70 at least if not 55.  However as she is having abdominal discomfort and bloating, will hold off on making any changes until she settles down with fluid overload state.  5. Pure hypercholesterolemia As dictated above, will make further recommendation  regarding lipid therapy on her next office visit.    Adrian Prows, MD, [facility] 10/28/2022, 4:51 PM Office: [phone] Fax: [phone] Pager: [phone]

## 2022-10-30 ENCOUNTER — Encounter: Payer: Self-pay | Admitting: Cardiology

## 2022-11-01 ENCOUNTER — Telehealth: Payer: Self-pay

## 2022-11-01 DIAGNOSIS — I10 Essential (primary) hypertension: Secondary | ICD-10-CM

## 2022-11-01 MED ORDER — NEBIVOLOL HCL 10 MG PO TABS: 10.0000 mg | ORAL_TABLET | Freq: Every day | ORAL | 2 refills | Status: DC

## 2022-11-01 NOTE — Telephone Encounter (Signed)
Patient says her bp is running between 161/98 and 171/103. Should she increase her Spironolactone?

## 2022-11-01 NOTE — Telephone Encounter (Signed)
ICD-10-CM   1. Essential hypertension  I10 nebivolol (BYSTOLIC) 10 MG tablet     Meds ordered this encounter  Medications   nebivolol (BYSTOLIC) 10 MG tablet    Sig: Take 1 tablet (10 mg total) by mouth daily.    Dispense:  30 tablet    Refill:  2

## 2022-11-03 NOTE — Telephone Encounter (Signed)
Spoke with patient and let her know that Dr. Einar Gip sent in Hiram 10mg . She is to take this in addition to Spironolactone. She verbalized understanding.

## 2022-11-09 LAB — BASIC METABOLIC PANEL
BUN/Creatinine Ratio: 22 (ref 12–28)
BUN: 18 mg/dL (ref 8–27)
CO2: 20 mmol/L (ref 20–29)
Calcium: 9.7 mg/dL (ref 8.7–10.3)
Chloride: 99 mmol/L (ref 96–106)
Creatinine, Ser: 0.81 mg/dL (ref 0.57–1.00)
Glucose: 198 mg/dL — ABNORMAL HIGH (ref 70–99)
Potassium: 4.7 mmol/L (ref 3.5–5.2)
Sodium: 136 mmol/L (ref 134–144)
eGFR: 82 mL/min/{1.73_m2} (ref >59)

## 2022-11-13 ENCOUNTER — Encounter: Payer: Self-pay | Admitting: Cardiology

## 2022-11-26 ENCOUNTER — Ambulatory Visit: Payer: Medicare Other | Admitting: Cardiology

## 2022-11-26 ENCOUNTER — Encounter: Payer: Self-pay | Admitting: Cardiology

## 2022-11-26 VITALS — BP 147/84 | HR 55 | Resp 16 | Ht 63.0 in | Wt 140.0 lb

## 2022-11-26 DIAGNOSIS — I7300 Raynaud's syndrome without gangrene: Secondary | ICD-10-CM

## 2022-11-26 DIAGNOSIS — R600 Localized edema: Secondary | ICD-10-CM

## 2022-11-26 DIAGNOSIS — I10 Essential (primary) hypertension: Secondary | ICD-10-CM

## 2022-11-26 DIAGNOSIS — E7800 Pure hypercholesterolemia, unspecified: Secondary | ICD-10-CM

## 2022-11-26 DIAGNOSIS — R931 Abnormal findings on diagnostic imaging of heart and coronary circulation: Secondary | ICD-10-CM

## 2022-11-26 MED ORDER — NEBIVOLOL HCL 20 MG PO TABS: 20.0000 mg | ORAL_TABLET | Freq: Every day | ORAL | 3 refills | Status: DC

## 2022-11-26 MED ORDER — SPIRONOLACTONE 50 MG PO TABS: 50.0000 mg | ORAL_TABLET | ORAL | 1 refills | Status: DC

## 2022-11-26 NOTE — Progress Notes (Signed)
Patient referred by Jola Baptist, PA-C for hypertension  Subjective:   Felicia Wilcox, female    DOB: [birth year], 62 y.o.   MRN: [id number]   Chief Complaint  Patient presents with   Hypertension   Leg Swelling   Hyperlipidemia   Follow-up    4 weeks   HPI  Felicia Wilcox is a 62 y.o. Caucasian female with hypertension, hyperglycemia, elevated coronary calcium score in the 91 percentile, OSA, Raynaud's syndrome, presents for follow-up of leg edema, hypertension and hyperlipidemia management.  On her last office visit I discontinued amlodipine due to leg edema and also weight gain and fluid overload state.  I had started her on Bystolic and also spironolactone, she is tolerating this but states that her blood pressure is still elevated.  Leg edema has resolved.  Dyspnea has improved. Symptoms of Raynaud's are slowly getting worse with cold hands.  No gangrene.   Past Medical History:  Diagnosis Date   Anxiety    Arthritis    2 herniated disc lower back   Chronic lower back pain    GERD (gastroesophageal reflux disease)    Hyperlipidemia    Hypertension    Sleep apnea    mild - does not use CPAP machine   Smoker    Past Surgical History:  Procedure Laterality Date   Abalation      BACK SURGERY     x 3 - branch block lower back injections -block nerve endings   BARTHOLIN GLAND CYST EXCISION     CESAREAN SECTION     HYSTEROSCOPY N/A 09/13/2014   Procedure: HYSTEROSCOPY / REMOVAL OF INTRAUTERINE DEVICE;  Surgeon: Lahoma Crocker, MD;  Location: [facility];  Service: Gynecology;  Laterality: N/A;  IUD removal   NECK SURGERY     ? C3-4-5 with rods   TONSILLECTOMY     WISDOM TOOTH EXTRACTION     Social History   Tobacco Use  Smoking Status Former   Packs/day: 0.50   Years: 6.00   Total pack years: 3.00   Types: Cigarettes   Quit date: 2019   Years since quitting: 5.1  Smokeless Tobacco Never   Social History   Substance and Sexual Activity  Alcohol Use  No   Alcohol/week: 0.0 standard drinks of alcohol   Comment: Quits 1 month ago.   Family History  Problem Relation Age of Onset   Heart disease Mother    Cancer Father    Stroke Sister    Heart disease Sister    Diabetes Maternal Grandmother    Breast cancer Neg Hx    Allergies  Allergen Reactions   Aspartame And Phenylalanine Anaphylaxis   Duloxetine Hcl Other (See Comments)    Other reaction(s): felt like a zombie Other reaction(s): felt like a zombie    Sertraline Hcl Other (See Comments)   Codeine     "Bleed internally"    Sulfa Antibiotics Nausea And Vomiting     Current Outpatient Medications:    Cholecalciferol (VITAMIN D3) 125 MCG (5000 UT) CAPS, Take by mouth., Disp: , Rfl:    donepezil (ARICEPT) 5 MG tablet, Take 5 mg by mouth daily., Disp: , Rfl:    nortriptyline (PAMELOR) 10 MG capsule, Take 1 capsule (10 mg total) by mouth at bedtime., Disp: 90 capsule, Rfl: 0   nortriptyline (PAMELOR) 25 MG capsule, Take 1 capsule (25 mg total) by mouth at bedtime., Disp: 90 capsule, Rfl: 1   omeprazole (PRILOSEC) 40 MG capsule, Take 40  mg by mouth every morning., Disp: , Rfl:    rosuvastatin (CRESTOR) 10 MG tablet, Take 1 tablet (10 mg total) by mouth daily., Disp: 90 tablet, Rfl: 3   nebivolol 20 MG TABS, Take 1 tablet (20 mg total) by mouth daily., Disp: 90 tablet, Rfl: 3   spironolactone (ALDACTONE) 50 MG tablet, Take 1 tablet (50 mg total) by mouth every morning., Disp: 90 tablet, Rfl: 1  EKG   CT cardiac scoring 10/2020: Total score 172, 91st percentile LM: 0.8 LAD: 85.6 Lcx: 85.2 RCA: 0  Extracardiac findings:  Two small pulmonary nodules, largest measuring 3 mm. These small pulmonary nodules are indeterminate. No follow-up needed if patient is low-risk . 3. Visualized liver has low-attenuation. Findings are suggestive for hepatic steatosis.  ABI 06/28/2022: This exam reveals normal perfusion of the right lower extremity (ABI 1.08).  This exam reveals normal  perfusion of the left lower extremity (ABI 1.08).  Mildly abnormal biphasic waveform pattern at the level of the ankles.  PCV ECHOCARDIOGRAM COMPLETE 06/28/2022  Narrative Echocardiogram 06/28/2022: Normal LV systolic function with visual EF 60-65%. Left ventricle cavity is normal in size. Normal left ventricular wall thickness. Normal global wall motion. Normal diastolic filling pattern, normal LAP. Interatrial septum is redundant and grossly intact by color Doppler but a small PFO cannot be ruled out. No significant valvular abnormalities. No prior study for comparison. If clinically indicated consider limited echocardiogram with bubble study.   Mobile cardiac telemetry 13 days 05/27/2022 - 06/10/2022: Dominant rhythm: Sinus. HR 54-132 bpm. Avg HR 84 bpm, in sinus rhythm. 1 episode of atrial tachycardia at 158 bpm for 4 beats. <1% isolated SVE,  couplet/triplets. 0 episodes of VT. <1% isolated VE, couplets. No atrial fibrillation/atrial flutter/SVT/VT/high grade AV block, sinus pause >3sec noted. 8 patient triggered events, correlated with sinus rhythm/VE.  PCV MYOCARDIAL PERFUSION WO LEXISCAN 08/17/2022  Narrative Lexiscan (with Bruce protocol) Nuclear stress test 08/17/2022: There is a small sized very mild reversible defect in the basal inferior-lateral region. Overall LV systolic function is normal without regional wall motion abnormalities. Stress LV EF: 63%. Non-diagnostic ECG stress due inability to achieve THR. Hence test converted to [facility] protocol. Patient exercised on a Bruce protocol. Achieved 71% of MPHR. The patient exercised for 6 minutes and 20 seconds achieving approximately 7.54 METs. Normal BP response. No previous exam available for comparison. Low risk study.  EKG:   EKG 10/28/2022: Normal sinus rhythm at rate of 84 bpm, normal axis, no evidence of ischemia, normal EKG.  Compared to 05/27/2022, no change.   Recent labs:  Lab Results  Component Value Date    NA 136 11/08/2022   K 4.7 11/08/2022   CO2 20 11/08/2022   GLUCOSE 198 (H) 11/08/2022   BUN 18 11/08/2022   CREATININE 0.81 11/08/2022   CALCIUM 9.7 11/08/2022   EGFR 82 11/08/2022   GFRNONAA 96 11/11/2015    Lab Results  Component Value Date   CHOL 179 10/05/2022   HDL 64 10/05/2022   LDLCALC 98 10/05/2022   TRIG 92 10/05/2022    Labs 08/13/2021:  Total cholesterol 158, triglycerides 116, HDL 67, LDL 68.  Non-HDL cholesterol 91.  05/20/2022: Glucose 89, BUN/Cr 14/0.72. EGFR >90. Na/K 138/3.9. Rest of the CMP normal H/H 15/46. MCV 92. Platelets 306 HbA1C 6.1% TSH 2.3normal  Review of Systems  Cardiovascular:  Positive for dyspnea on exertion and leg swelling. Negative for chest pain.  Gastrointestinal:  Positive for bloating.   Vitals:   11/26/22 1548 11/26/22 1549  BP: (!) 177/93 (!) 147/84  Pulse: 67 (!) 55  Resp: 16   SpO2: 98%     Body mass index is 24.8 kg/m. Filed Weights   11/26/22 1548  Weight: 140 lb (63.5 kg)    Objective:   Physical Exam Neck:     Vascular: No carotid bruit or JVD.  Cardiovascular:     Rate and Rhythm: Normal rate and regular rhythm.     Pulses: Intact distal pulses.     Heart sounds: Normal heart sounds. No murmur heard.    No gallop.  Pulmonary:     Effort: Pulmonary effort is normal.     Breath sounds: Normal breath sounds.  Abdominal:     General: Bowel sounds are normal.     Palpations: Abdomen is soft.     Comments: Mild distention of the abdomen present.  Musculoskeletal:     Right lower leg: Edema (2+ below-knee pitting) present.     Left lower leg: Edema (2+ below-knee pitting) present.    Visit diagnoses:   ICD-10-CM   1. Essential hypertension  I10 nebivolol 20 MG TABS    spironolactone (ALDACTONE) 50 MG tablet    2. Raynaud's disease without gangrene  I73.00     3. Bilateral leg edema  R60.0     4. Elevated coronary artery calcium score Feb 2022 showed calcium score of 91st percentile  R93.1     5.  Pure hypercholesterolemia  E78.00      No orders of the defined types were placed in this encounter.   Assessment & Recommendations:   Felicia Wilcox is a 62 y.o. Caucasian female with hypertension, hyperglycemia, elevated coronary calcium score in the 91 percentile, OSA, Raynaud's syndrome presents for follow-up and management of hypertension, hyperlipidemia.  1. Essential hypertension Patient blood pressure remains elevated.  Will increase Bystolic from 10 mg to 20 mg daily, hopefully this will help with Raynaud's phenomena with vasodilator properties as well.  2. Raynaud's disease without gangrene Could not use amlodipine due to severe leg edema.  Could consider addition of isosorbide dinitrate 3 times daily to help with symptoms, will discuss this on her next office visit.  3. Bilateral leg edema There has been resolution of bilateral leg edema since amlodipine was discontinued.  Blood pressure is not well-controlled, she is tolerating spironolactone, will increase to 50 mg daily.  4. Elevated coronary artery calcium score Feb 2022 showed calcium score of 91st percentile In view of elevated coronary calcium score, I have started her on Crestor 10 mg daily which she is tolerating.  She will eventually need lipid profile testing.  5. Pure hypercholesterolemia LDL mildly elevated but tolerating Crestor.  In view of marked elevated coronary calcium score, will be aggressive and try to get her LDL down to <70 if not closer to 55.  I also noticed that her blood sugar was 198 on nonfasting blood that was performed as a part of BMP, she has hyperglycemia as a diagnosis, may need to be further evaluated to exclude diabetes mellitus.  Office visit in 3 months.    Adrian Prows, MD, [facility] 11/26/2022, 5:28 PM Office: [phone] Fax: [phone] Pager: [phone]

## 2022-12-28 ENCOUNTER — Other Ambulatory Visit: Payer: Self-pay | Admitting: Psychiatry

## 2022-12-28 DIAGNOSIS — F331 Major depressive disorder, recurrent, moderate: Secondary | ICD-10-CM

## 2022-12-28 DIAGNOSIS — G894 Chronic pain syndrome: Secondary | ICD-10-CM

## 2022-12-28 DIAGNOSIS — F4310 Post-traumatic stress disorder, unspecified: Secondary | ICD-10-CM

## 2023-02-01 ENCOUNTER — Ambulatory Visit (INDEPENDENT_AMBULATORY_CARE_PROVIDER_SITE_OTHER): Payer: Medicare Other | Admitting: Obstetrics

## 2023-02-01 ENCOUNTER — Encounter: Payer: Self-pay | Admitting: Obstetrics

## 2023-02-01 ENCOUNTER — Other Ambulatory Visit (HOSPITAL_COMMUNITY)
Admission: RE | Admit: 2023-02-01 | Discharge: 2023-02-01 | Disposition: A | Discharge status: Home / Self Care | Payer: Medicare Other | Source: Ambulatory Visit | Attending: Obstetrics | Admitting: Obstetrics

## 2023-02-01 VITALS — BP 140/81 | HR 53 | Wt 142.8 lb

## 2023-02-01 DIAGNOSIS — N951 Menopausal and female climacteric states: Secondary | ICD-10-CM | POA: Diagnosis not present

## 2023-02-01 DIAGNOSIS — N898 Other specified noninflammatory disorders of vagina: Secondary | ICD-10-CM | POA: Insufficient documentation

## 2023-02-01 DIAGNOSIS — Z780 Asymptomatic menopausal state: Secondary | ICD-10-CM | POA: Diagnosis not present

## 2023-02-01 DIAGNOSIS — I10 Essential (primary) hypertension: Secondary | ICD-10-CM

## 2023-02-01 MED ORDER — CLIMARA PRO 0.045-0.015 MG/DAY TD PTWK
1.0000 | MEDICATED_PATCH | TRANSDERMAL | 12 refills | Status: DC
Start: 2023-02-01 — End: 2023-02-15

## 2023-02-01 NOTE — Progress Notes (Unsigned)
Pt is in the office for reporting vaginal discharge, sweating, odor and would like to self swab and discuss hormonal issues.  Reports rapid weight gain in the last 6 mths

## 2023-02-01 NOTE — Progress Notes (Unsigned)
Patient ID: Felicia Wilcox, female   DOB: May 13, 1961, 62 y.o.   MRN: [id number]  Chief Complaint  Patient presents with   GYN    HPI Felicia Wilcox is a 62 y.o. female.  Complains of hot flashes.  Also has vaginal discharge. HPI  Past Medical History:  Diagnosis Date   Anxiety    Arthritis    2 herniated disc lower back   Chronic lower back pain    GERD (gastroesophageal reflux disease)    Hyperlipidemia    Hypertension    Sleep apnea    mild - does not use CPAP machine   Smoker     Past Surgical History:  Procedure Laterality Date   Abalation      BACK SURGERY     x 3 - branch block lower back injections -block nerve endings   BARTHOLIN GLAND CYST EXCISION     CESAREAN SECTION     HYSTEROSCOPY N/A 09/13/2014   Procedure: HYSTEROSCOPY / REMOVAL OF INTRAUTERINE DEVICE;  Surgeon: Antionette Char, MD;  Location: [facility];  Service: Gynecology;  Laterality: N/A;  IUD removal   NECK SURGERY     ? C3-4-5 with rods   TONSILLECTOMY     WISDOM TOOTH EXTRACTION      Family History  Problem Relation Age of Onset   Heart disease Mother    Cancer Father    Stroke Sister    Heart disease Sister    Diabetes Maternal Grandmother    Breast cancer Neg Hx     Social History Social History   Tobacco Use   Smoking status: Former    Packs/day: 0.50    Years: 6.00    Additional pack years: 0.00    Total pack years: 3.00    Types: Cigarettes    Quit date: 2019    Years since quitting: 5.3   Smokeless tobacco: Never  Vaping Use   Vaping Use: Former   Quit date: 12/28/2017  Substance Use Topics   Alcohol use: No    Alcohol/week: 0.0 standard drinks of alcohol    Comment: Quits 1 month ago.   Drug use: No    Allergies  Allergen Reactions   Aspartame And Phenylalanine Anaphylaxis   Duloxetine Hcl Other (See Comments)    Other reaction(s): felt like a zombie Other reaction(s): felt like a zombie    Sertraline Hcl Other (See Comments)   Codeine     "Bleed  internally"    Sulfa Antibiotics Nausea And Vomiting    Current Outpatient Medications  Medication Sig Dispense Refill   estradiol-levonorgestrel (CLIMARA PRO) 0.045-0.015 MG/DAY Place 1 patch onto the skin once a week. 4 patch 12   nebivolol 20 MG TABS Take 1 tablet (20 mg total) by mouth daily. 90 tablet 3   nortriptyline (PAMELOR) 10 MG capsule Take 1 capsule by mouth at bedtime 90 capsule 0   nortriptyline (PAMELOR) 25 MG capsule Take 1 capsule (25 mg total) by mouth at bedtime. 90 capsule 1   omeprazole (PRILOSEC) 40 MG capsule Take 40 mg by mouth every morning.     rosuvastatin (CRESTOR) 10 MG tablet Take 1 tablet (10 mg total) by mouth daily. 90 tablet 3   spironolactone (ALDACTONE) 50 MG tablet Take 1 tablet (50 mg total) by mouth every morning. 90 tablet 1   Cholecalciferol (VITAMIN D3) 125 MCG (5000 UT) CAPS Take by mouth. (Patient not taking: Reported on 02/01/2023)     donepezil (ARICEPT) 5 MG tablet Take 5  mg by mouth daily. (Patient not taking: Reported on 02/01/2023)     No current facility-administered medications for this visit.    Review of Systems Review of Systems Constitutional: negative for fatigue and weight loss Respiratory: negative for cough and wheezing Cardiovascular: negative for chest pain, fatigue and palpitations Gastrointestinal: negative for abdominal pain and change in bowel habits Genitourinary:positive for hot flashes Integument/breast: negative for nipple discharge Musculoskeletal:negative for myalgias Neurological: negative for gait problems and tremors Behavioral/Psych: negative for abusive relationship, depression Endocrine: negative for temperature intolerance      Blood pressure (!) 140/81, pulse (!) 53, weight 142 lb 12.8 oz (64.8 kg).  Physical Exam Physical Exam General:   Alert and no distress  Skin:   no rash or abnormalities  Lungs:   clear to auscultation bilaterally  Heart:   regular rate and rhythm, S1, S2 normal, no murmur,  click, rub or gallop  Pelvic exam:  Deferred   I have spent a total of 15 minutes of face-to-face time, excluding clinical staff time, reviewing notes and preparing to see patient, ordering tests and/or medications, and counseling the patient.   Data Reviewed Labs  Assessment     1. Vaginal discharge Rx: - Cervicovaginal ancillary only( [place])  2. Menopause  3. Hot flashes due to menopause Rx: - estradiol-levonorgestrel (CLIMARA PRO) 0.045-0.015 MG/DAY; Place 1 patch onto the skin once a week.  Dispense: 4 patch; Refill: 12  4. HTN (hypertension), benign - clinically stable on meds     Plan   Follow up in 3 weeks   Meds ordered this encounter  Medications   estradiol-levonorgestrel (CLIMARA PRO) 0.045-0.015 MG/DAY    Sig: Place 1 patch onto the skin once a week.    Dispense:  4 patch    Refill:  12    Brock Bad, MD 02/02/2023 12:56 PM

## 2023-02-03 ENCOUNTER — Telehealth: Payer: Self-pay | Admitting: *Deleted

## 2023-02-03 LAB — CERVICOVAGINAL ANCILLARY ONLY
Bacterial Vaginitis (gardnerella): POSITIVE — AB
Candida Glabrata: NEGATIVE
Candida Vaginitis: NEGATIVE
Chlamydia: NEGATIVE
Comment: NEGATIVE
Comment: NEGATIVE
Comment: NEGATIVE
Comment: NEGATIVE
Comment: NEGATIVE
Comment: NORMAL
Neisseria Gonorrhea: NEGATIVE
Trichomonas: NEGATIVE

## 2023-02-03 NOTE — Telephone Encounter (Signed)
TC from pt regarding Climara Pro RX. RX not covered by insurance and will cost $200-300. Alternate preferred meds listed. Information forwarded to Dr. Clearance Coots who will check to see if alternate meds are appropriate.

## 2023-02-04 ENCOUNTER — Other Ambulatory Visit: Payer: Self-pay | Admitting: Obstetrics

## 2023-02-04 DIAGNOSIS — B9689 Other specified bacterial agents as the cause of diseases classified elsewhere: Secondary | ICD-10-CM

## 2023-02-04 MED ORDER — METRONIDAZOLE 500 MG PO TABS
500.0000 mg | ORAL_TABLET | Freq: Two times a day (BID) | ORAL | 2 refills | Status: DC
Start: 2023-02-04 — End: 2023-03-23

## 2023-02-08 ENCOUNTER — Telehealth: Payer: Self-pay

## 2023-02-08 MED ORDER — METRONIDAZOLE 0.75 % VA GEL: 1.0000 | Freq: Two times a day (BID) | VAGINAL | 0 refills | Status: AC

## 2023-02-08 NOTE — Telephone Encounter (Signed)
S/w pt and advised of results, pt requested metrogel vs pills, updated rx with provider approval

## 2023-02-15 ENCOUNTER — Ambulatory Visit (INDEPENDENT_AMBULATORY_CARE_PROVIDER_SITE_OTHER): Payer: Medicare Other | Admitting: Obstetrics

## 2023-02-15 ENCOUNTER — Encounter: Payer: Self-pay | Admitting: Obstetrics

## 2023-02-15 VITALS — BP 136/89 | HR 62 | Ht 63.0 in | Wt 142.0 lb

## 2023-02-15 DIAGNOSIS — Z780 Asymptomatic menopausal state: Secondary | ICD-10-CM

## 2023-02-15 DIAGNOSIS — N951 Menopausal and female climacteric states: Secondary | ICD-10-CM

## 2023-02-15 DIAGNOSIS — Z1239 Encounter for other screening for malignant neoplasm of breast: Secondary | ICD-10-CM | POA: Diagnosis not present

## 2023-02-15 DIAGNOSIS — E2839 Other primary ovarian failure: Secondary | ICD-10-CM

## 2023-02-15 DIAGNOSIS — I10 Essential (primary) hypertension: Secondary | ICD-10-CM

## 2023-02-15 DIAGNOSIS — Z01419 Encounter for gynecological examination (general) (routine) without abnormal findings: Secondary | ICD-10-CM | POA: Diagnosis not present

## 2023-02-15 MED ORDER — ESTRADIOL-NORETHINDRONE ACET 0.5-0.1 MG PO TABS
1.0000 | ORAL_TABLET | Freq: Every day | ORAL | 12 refills | Status: DC
Start: 2023-02-15 — End: 2023-11-08

## 2023-02-15 NOTE — Progress Notes (Unsigned)
Patient presents for AEX. Patient would like to discuss rx for hormone replacement. Patient states that the Climara was to expensive, but states that her insurance will cover Jinteli and Mimve Y. Would like to discuss changing to one of these and your recommendations.  Last Pap: 02/11/2022 Normal Last MM:

## 2023-02-15 NOTE — Progress Notes (Unsigned)
Subjective:        Felicia Wilcox is a 62 y.o. female here for a routine exam.  Current complaints: Severe hot flashes..    Personal health questionnaire:  Is patient Ashkenazi Jewish, have a family history of breast and/or ovarian cancer: no Is there a family history of uterine cancer diagnosed at age < 20, gastrointestinal cancer, urinary tract cancer, family member who is a Personnel officer syndrome-associated carrier: no Is the patient overweight and hypertensive, family history of diabetes, personal history of gestational diabetes, preeclampsia or PCOS: no Is patient over 80, have PCOS,  family history of premature CHD under age 84, diabetes, smoke, have hypertension or peripheral artery disease:  no At any time, has a partner hit, kicked or otherwise hurt or frightened you?: no Over the past 2 weeks, have you felt down, depressed or hopeless?: no Over the past 2 weeks, have you felt little interest or pleasure in doing things?:no   Gynecologic History No LMP recorded. Patient is postmenopausal. Contraception: post menopausal status Last Pap: 2023. Results were: normal Last mammogram: 2023. Results were: normal  Obstetric History OB History  Gravida Para Term Preterm AB Living  1         1  SAB IAB Ectopic Multiple Live Births          1    # Outcome Date GA Lbr Len/2nd Weight Sex Delivery Anes PTL Lv  1 Gravida 07/05/91    M CS-LTranv EPI  LIV    Past Medical History:  Diagnosis Date   Anxiety    Arthritis    2 herniated disc lower back   Chronic lower back pain    GERD (gastroesophageal reflux disease)    Hyperlipidemia    Hypertension    Sleep apnea    mild - does not use CPAP machine   Smoker     Past Surgical History:  Procedure Laterality Date   Abalation      BACK SURGERY     x 3 - branch block lower back injections -block nerve endings   BARTHOLIN GLAND CYST EXCISION     CESAREAN SECTION     HYSTEROSCOPY N/A 09/13/2014   Procedure: HYSTEROSCOPY / REMOVAL  OF INTRAUTERINE DEVICE;  Surgeon: Antionette Char, MD;  Location: [facility];  Service: Gynecology;  Laterality: N/A;  IUD removal   NECK SURGERY     ? C3-4-5 with rods   TONSILLECTOMY     WISDOM TOOTH EXTRACTION       Current Outpatient Medications:    Estradiol-Norethindrone Acet 0.5-0.1 MG tablet, Take 1 tablet by mouth daily., Disp: 30 tablet, Rfl: 12   metroNIDAZOLE (FLAGYL) 500 MG tablet, Take 1 tablet (500 mg total) by mouth 2 (two) times daily., Disp: 14 tablet, Rfl: 2   nebivolol 20 MG TABS, Take 1 tablet (20 mg total) by mouth daily., Disp: 90 tablet, Rfl: 3   nortriptyline (PAMELOR) 10 MG capsule, Take 1 capsule by mouth at bedtime, Disp: 90 capsule, Rfl: 0   nortriptyline (PAMELOR) 25 MG capsule, Take 1 capsule (25 mg total) by mouth at bedtime., Disp: 90 capsule, Rfl: 1   omeprazole (PRILOSEC) 40 MG capsule, Take 40 mg by mouth every morning., Disp: , Rfl:    rosuvastatin (CRESTOR) 10 MG tablet, Take 1 tablet (10 mg total) by mouth daily., Disp: 90 tablet, Rfl: 3   spironolactone (ALDACTONE) 50 MG tablet, Take 1 tablet (50 mg total) by mouth every morning., Disp: 90 tablet, Rfl: 1  Cholecalciferol (VITAMIN D3) 125 MCG (5000 UT) CAPS, Take by mouth. (Patient not taking: Reported on 02/15/2023), Disp: , Rfl:    donepezil (ARICEPT) 5 MG tablet, Take 5 mg by mouth daily. (Patient not taking: Reported on 02/01/2023), Disp: , Rfl:  Allergies  Allergen Reactions   Aspartame And Phenylalanine Anaphylaxis   Duloxetine Hcl Other (See Comments)    Other reaction(s): felt like a zombie Other reaction(s): felt like a zombie    Sertraline Hcl Other (See Comments)   Codeine     "Bleed internally"    Sulfa Antibiotics Nausea And Vomiting    Social History   Tobacco Use   Smoking status: Former    Packs/day: 0.50    Years: 6.00    Additional pack years: 0.00    Total pack years: 3.00    Types: Cigarettes    Quit date: 2019    Years since quitting: 5.3   Smokeless tobacco: Never   Substance Use Topics   Alcohol use: No    Alcohol/week: 0.0 standard drinks of alcohol    Comment: Quits 1 month ago.    Family History  Problem Relation Age of Onset   Heart disease Mother    Cancer Father    Stroke Sister    Heart disease Sister    Diabetes Maternal Grandmother    Breast cancer Neg Hx       Review of Systems  Constitutional: negative for fatigue and weight loss Respiratory: negative for cough and wheezing Cardiovascular: negative for chest pain, fatigue and palpitations Gastrointestinal: negative for abdominal pain and change in bowel habits Musculoskeletal:negative for myalgias Neurological: negative for gait problems and tremors Behavioral/Psych: negative for abusive relationship, depression Endocrine: negative for temperature intolerance    Genitourinary:negative for abnormal menstrual periods, genital lesions, hot flashes, sexual problems and vaginal discharge Integument/breast: negative for breast lump, breast tenderness, nipple discharge and skin lesion(s)    Objective:       BP 136/89   Pulse 62   Ht 5\' 3"  (1.6 m)   Wt 142 lb (64.4 kg)   BMI 25.15 kg/m  General:   alert  Skin:   no rash or abnormalities  Lungs:   clear to auscultation bilaterally  Heart:   regular rate and rhythm, S1, S2 normal, no murmur, click, rub or gallop  Breasts:   normal without suspicious masses, skin or nipple changes or axillary nodes  Abdomen:  normal findings: no organomegaly, soft, non-tender and no hernia  Pelvis:  External genitalia: normal general appearance Urinary system: urethral meatus normal and bladder without fullness, nontender Vaginal: normal without tenderness, induration or masses Cervix: normal appearance Adnexa: normal bimanual exam Uterus: anteverted and non-tender, normal size   Lab Review Urine pregnancy test Labs reviewed yes Radiologic studies reviewed yes  I have spent a total of 20 minutes of face-to-face time, excluding clinical  staff time, reviewing notes and preparing to see patient, ordering tests and/or medications, and counseling the patient.   Assessment:    1. Encounter for gynecological examination with Papanicolaou smear of cervix Rx: - Cytology - PAP  2. Menopause  3. Hypoestrogenemia  4. Screening breast examination R x: - MM Digital Screening; Future  5. HTN (hypertension), benign - managed by PC  6. Hot flashes due to menopause *** - Estradiol-Norethindrone Acet 0.5-0.1 MG tablet; Take 1 tablet by mouth daily.  Dispense: 30 tablet; Refill: 12    Plan:    Education reviewed: calcium supplements, depression evaluation, low fat, low cholesterol  diet, safe sex/STD prevention, self breast exams, skin cancer screening, and weight bearing exercise. Hormone replacement therapy: hormone replacement therapy: Estradiol 0.5 / Norethindrone ).0.1 MG, , risks and benefits reviewed, prescription for 12 months, and follow up appointment recommended. Mammogram ordered. Follow up in: 6 months. Bone Density Study done    Meds ordered this encounter  Medications   Estradiol-Norethindrone Acet 0.5-0.1 MG tablet    Sig: Take 1 tablet by mouth daily.    Dispense:  30 tablet    Refill:  12   Orders Placed This Encounter  Procedures   MM Digital Screening    Standing Status:   Future    Standing Expiration Date:   02/15/2024    Order Specific Question:   Reason for Exam (SYMPTOM  OR DIAGNOSIS REQUIRED)    Answer:   Screening    Order Specific Question:   Preferred imaging location?    Answer:   [facility]    Brock Bad, MD 02/15/2023 4:24 PM

## 2023-02-23 LAB — CYTOLOGY - PAP: Adequacy: ABNORMAL

## 2023-03-11 ENCOUNTER — Ambulatory Visit
Admission: RE | Admit: 2023-03-11 | Discharge: 2023-03-11 | Disposition: A | Discharge status: Home / Self Care | Payer: Medicare Other | Source: Ambulatory Visit | Attending: Obstetrics | Admitting: Obstetrics

## 2023-03-11 DIAGNOSIS — Z1239 Encounter for other screening for malignant neoplasm of breast: Secondary | ICD-10-CM

## 2023-03-15 ENCOUNTER — Encounter: Payer: Self-pay | Admitting: Obstetrics

## 2023-03-15 ENCOUNTER — Ambulatory Visit (INDEPENDENT_AMBULATORY_CARE_PROVIDER_SITE_OTHER): Payer: Medicare Other | Admitting: Obstetrics

## 2023-03-15 ENCOUNTER — Other Ambulatory Visit (HOSPITAL_COMMUNITY)
Admission: RE | Admit: 2023-03-15 | Discharge: 2023-03-15 | Disposition: A | Discharge status: Home / Self Care | Payer: Medicare Other | Source: Ambulatory Visit | Attending: Obstetrics | Admitting: Obstetrics

## 2023-03-15 VITALS — BP 149/80 | HR 54 | Wt 142.7 lb

## 2023-03-15 DIAGNOSIS — Z780 Asymptomatic menopausal state: Secondary | ICD-10-CM

## 2023-03-15 DIAGNOSIS — Z01419 Encounter for gynecological examination (general) (routine) without abnormal findings: Secondary | ICD-10-CM | POA: Insufficient documentation

## 2023-03-15 DIAGNOSIS — N951 Menopausal and female climacteric states: Secondary | ICD-10-CM | POA: Diagnosis not present

## 2023-03-15 DIAGNOSIS — R87615 Unsatisfactory cytologic smear of cervix: Secondary | ICD-10-CM | POA: Diagnosis not present

## 2023-03-15 DIAGNOSIS — Z1151 Encounter for screening for human papillomavirus (HPV): Secondary | ICD-10-CM | POA: Insufficient documentation

## 2023-03-15 LAB — HEMOGLOBIN A1C: Hemoglobin A1C: 6.3

## 2023-03-15 LAB — AMB RESULTS CONSOLE CBG: Glucose: 139

## 2023-03-15 NOTE — Progress Notes (Unsigned)
Patient ID: Felicia Wilcox, female   DOB: 01-Sep-1961, 62 y.o.   MRN: [id number]  Chief Complaint  Patient presents with   Gynecologic Exam    HPI Felicia Wilcox is a 62 y.o. female.  Presents for repeat pat due to inadequate specimen quality on last pap because cells were obscured by vaginal medication. HPI  Past Medical History:  Diagnosis Date   Anxiety    Arthritis    2 herniated disc lower back   Chronic lower back pain    GERD (gastroesophageal reflux disease)    Hyperlipidemia    Hypertension    Sleep apnea    mild - does not use CPAP machine   Smoker     Past Surgical History:  Procedure Laterality Date   Abalation      BACK SURGERY     x 3 - branch block lower back injections -block nerve endings   BARTHOLIN GLAND CYST EXCISION     CESAREAN SECTION     HYSTEROSCOPY N/A 09/13/2014   Procedure: HYSTEROSCOPY / REMOVAL OF INTRAUTERINE DEVICE;  Surgeon: Antionette Char, MD;  Location: [facility];  Service: Gynecology;  Laterality: N/A;  IUD removal   NECK SURGERY     ? C3-4-5 with rods   TONSILLECTOMY     WISDOM TOOTH EXTRACTION      Family History  Problem Relation Age of Onset   Heart disease Mother    Cancer Father    Stroke Sister    Heart disease Sister    Diabetes Maternal Grandmother    Breast cancer Neg Hx     Social History Social History   Tobacco Use   Smoking status: Former    Packs/day: 0.50    Years: 6.00    Additional pack years: 0.00    Total pack years: 3.00    Types: Cigarettes    Quit date: 2019    Years since quitting: 5.4   Smokeless tobacco: Never  Vaping Use   Vaping Use: Former   Quit date: 12/28/2017  Substance Use Topics   Alcohol use: No    Alcohol/week: 0.0 standard drinks of alcohol    Comment: Quits 1 month ago.   Drug use: No    Allergies  Allergen Reactions   Aspartame And Phenylalanine Anaphylaxis   Duloxetine Hcl Other (See Comments)    Other reaction(s): felt like a zombie Other reaction(s): felt like a  zombie    Sertraline Hcl Other (See Comments)   Codeine     "Bleed internally"    Sulfa Antibiotics Nausea And Vomiting    Current Outpatient Medications  Medication Sig Dispense Refill   Cholecalciferol (VITAMIN D3) 125 MCG (5000 UT) CAPS Take by mouth. (Patient not taking: Reported on 02/15/2023)     donepezil (ARICEPT) 5 MG tablet Take 5 mg by mouth daily. (Patient not taking: Reported on 02/01/2023)     Estradiol-Norethindrone Acet 0.5-0.1 MG tablet Take 1 tablet by mouth daily. 30 tablet 12   metroNIDAZOLE (FLAGYL) 500 MG tablet Take 1 tablet (500 mg total) by mouth 2 (two) times daily. 14 tablet 2   nebivolol 20 MG TABS Take 1 tablet (20 mg total) by mouth daily. 90 tablet 3   nortriptyline (PAMELOR) 10 MG capsule Take 1 capsule by mouth at bedtime 90 capsule 0   nortriptyline (PAMELOR) 25 MG capsule Take 1 capsule (25 mg total) by mouth at bedtime. 90 capsule 1   omeprazole (PRILOSEC) 40 MG capsule Take 40 mg by mouth every  morning.     rosuvastatin (CRESTOR) 10 MG tablet Take 1 tablet (10 mg total) by mouth daily. 90 tablet 3   spironolactone (ALDACTONE) 50 MG tablet Take 1 tablet (50 mg total) by mouth every morning. 90 tablet 1   No current facility-administered medications for this visit.    Review of Systems Review of Systems Constitutional: negative for fatigue and weight loss Respiratory: negative for cough and wheezing Cardiovascular: negative for chest pain, fatigue and palpitations Gastrointestinal: negative for abdominal pain and change in bowel habits Genitourinary:negative Integument/breast: negative for nipple discharge Musculoskeletal:negative for myalgias Neurological: negative for gait problems and tremors Behavioral/Psych: negative for abusive relationship, depression Endocrine: negative for temperature intolerance      Blood pressure (!) 149/80, pulse (!) 54, weight 142 lb 11.2 oz (64.7 kg).  Physical Exam Physical Exam General:   Alert and no distress    Skin:   no rash or abnormalities  Lungs:   clear to auscultation bilaterally  Heart:   regular rate and rhythm, S1, S2 normal, no murmur, click, rub or gallop  Breasts:   normal without suspicious masses, skin or nipple changes or axillary nodes  Abdomen:  normal findings: no organomegaly, soft, non-tender and no hernia  Pelvis:  External genitalia: normal general appearance Urinary system: urethral meatus normal and bladder without fullness, nontender Vaginal: normal without tenderness, induration or masses Cervix: normal appearance Adnexa: normal bimanual exam Uterus: anteverted and non-tender, normal size    I have spent a total of 20 minutes of face-to-face and non-face-to-face time, excluding clinical staff time, reviewing notes and preparing to see patient, ordering tests and/or medications, and counseling the patient.   Data Reviewed Pap Smear  Assessment     1. Encounter for gynecological examination with Papanicolaou smear of cervix Rx: - Cytology - PAP( [place])  2. Menopause  3. Hot flashes due to menopause - continue HRT     Plan   Follow up in 3 months  Brock Bad, MD 03/15/2023 4:13 PM

## 2023-03-15 NOTE — Progress Notes (Unsigned)
Pt presents for repeat PAP due to insufficient collection. Also desires increase in estrogen supplement.

## 2023-03-18 LAB — CYTOLOGY - PAP
Comment: NEGATIVE
Diagnosis: NEGATIVE
High risk HPV: NEGATIVE

## 2023-03-23 ENCOUNTER — Encounter: Payer: Self-pay | Admitting: Cardiology

## 2023-03-23 ENCOUNTER — Ambulatory Visit: Payer: Medicare Other | Admitting: Cardiology

## 2023-03-23 VITALS — BP 157/81 | HR 65 | Resp 16 | Ht 63.0 in | Wt 142.2 lb

## 2023-03-23 DIAGNOSIS — I10 Essential (primary) hypertension: Secondary | ICD-10-CM

## 2023-03-23 DIAGNOSIS — R931 Abnormal findings on diagnostic imaging of heart and coronary circulation: Secondary | ICD-10-CM

## 2023-03-23 DIAGNOSIS — E7800 Pure hypercholesterolemia, unspecified: Secondary | ICD-10-CM

## 2023-03-23 MED ORDER — SPIRONOLACTONE 50 MG PO TABS
25.0000 mg | ORAL_TABLET | Freq: Two times a day (BID) | ORAL | 1 refills | Status: DC
Start: 2023-03-23 — End: 2023-06-21

## 2023-03-23 NOTE — Progress Notes (Signed)
Patient referred by Verlee Rossetti, PA-C for hypertension  Subjective:   Felicia Wilcox, female    DOB: 10/22/1960, 62 y.o.   MRN: [id number]   Chief Complaint  Patient presents with   Essential hypertension   Elevated coronary artery calcium score Feb 2022 showed calc   Hyperlipidemia   Follow-up    6 months   HPI  Felicia Wilcox is a 62 y.o. Caucasian female with hypertension, hyperglycemia, elevated coronary calcium score in the 91 percentile, OSA on CPAP and compliant, Raynaud's syndrome, presents for follow-up of leg edema, hypertension and hyperlipidemia management.  She is tolerating increased dose of spironolactone and presently tolerating 20 mg of Bystolic without side effects, no worsening Raynaud's symptoms.  Patient has noticed very well-controlled blood pressure in the morning and in the evenings but during work to have her blood pressure is markedly elevated.  In the afternoon around noon through 4 PM, states that her blood pressure becomes really soft around 90 to 110 mmHg systolic, feels extremely fatigued and tired.  Other times it is elevated.   Past Medical History:  Diagnosis Date   Anxiety    Arthritis    2 herniated disc lower back   Chronic lower back pain    GERD (gastroesophageal reflux disease)    Hyperlipidemia    Hypertension    Sleep apnea    mild - does not use CPAP machine   Smoker    Past Surgical History:  Procedure Laterality Date   Abalation      BACK SURGERY     x 3 - branch block lower back injections -block nerve endings   BARTHOLIN GLAND CYST EXCISION     CESAREAN SECTION     HYSTEROSCOPY N/A 09/13/2014   Procedure: HYSTEROSCOPY / REMOVAL OF INTRAUTERINE DEVICE;  Surgeon: Antionette Char, MD;  Location: [facility];  Service: Gynecology;  Laterality: N/A;  IUD removal   NECK SURGERY     ? C3-4-5 with rods   TONSILLECTOMY     WISDOM TOOTH EXTRACTION     Social History   Tobacco Use  Smoking Status Former   Packs/day:  0.50   Years: 6.00   Additional pack years: 0.00   Total pack years: 3.00   Types: Cigarettes   Quit date: 2019   Years since quitting: 5.4  Smokeless Tobacco Never   Social History   Substance and Sexual Activity  Alcohol Use No   Alcohol/week: 0.0 standard drinks of alcohol   Comment: Quits 1 month ago.   Family History  Problem Relation Age of Onset   Heart disease Mother    Cancer Father    Stroke Sister    Heart disease Sister    Diabetes Maternal Grandmother    Breast cancer Neg Hx    Allergies  Allergen Reactions   Aspartame And Phenylalanine Anaphylaxis   Duloxetine Hcl Other (See Comments)    Other reaction(s): felt like a zombie Other reaction(s): felt like a zombie    Sertraline Hcl Other (See Comments)   Codeine     "Bleed internally"    Sulfa Antibiotics Nausea And Vomiting     Current Outpatient Medications:    Estradiol-Norethindrone Acet 0.5-0.1 MG tablet, Take 1 tablet by mouth daily., Disp: 30 tablet, Rfl: 12   famotidine (PEPCID) 20 MG tablet, Take 20 mg by mouth daily., Disp: , Rfl:    LINZESS 72 MCG capsule, Take 72 mcg by mouth every morning., Disp: , Rfl:  nebivolol 20 MG TABS, Take 1 tablet (20 mg total) by mouth daily., Disp: 90 tablet, Rfl: 3   nortriptyline (PAMELOR) 10 MG capsule, Take 1 capsule by mouth at bedtime, Disp: 90 capsule, Rfl: 0   nortriptyline (PAMELOR) 25 MG capsule, Take 1 capsule (25 mg total) by mouth at bedtime., Disp: 90 capsule, Rfl: 1   rosuvastatin (CRESTOR) 10 MG tablet, Take 1 tablet (10 mg total) by mouth daily., Disp: 90 tablet, Rfl: 3   spironolactone (ALDACTONE) 50 MG tablet, Take 0.5 tablets (25 mg total) by mouth 2 (two) times daily at 10 am and 4 pm., Disp: 90 tablet, Rfl: 1  EKG   CT cardiac scoring 10/2020: Total score 172, 91st percentile LM: 0.8 LAD: 85.6 Lcx: 85.2 RCA: 0  Extracardiac findings:  Two small pulmonary nodules, largest measuring 3 mm. These small pulmonary nodules are  indeterminate. No follow-up needed if patient is low-risk . 3. Visualized liver has low-attenuation. Findings are suggestive for hepatic steatosis.  ABI 06/28/2022: This exam reveals normal perfusion of the right lower extremity (ABI 1.08).  This exam reveals normal perfusion of the left lower extremity (ABI 1.08).  Mildly abnormal biphasic waveform pattern at the level of the ankles.  PCV ECHOCARDIOGRAM COMPLETE 06/28/2022  Narrative Echocardiogram 06/28/2022: Normal LV systolic function with visual EF 60-65%. Left ventricle cavity is normal in size. Normal left ventricular wall thickness. Normal global wall motion. Normal diastolic filling pattern, normal LAP. Interatrial septum is redundant and grossly intact by color Doppler but a small PFO cannot be ruled out. No significant valvular abnormalities. No prior study for comparison. If clinically indicated consider limited echocardiogram with bubble study.   Mobile cardiac telemetry 13 days 05/27/2022 - 06/10/2022: Dominant rhythm: Sinus. HR 54-132 bpm. Avg HR 84 bpm, in sinus rhythm. 1 episode of atrial tachycardia at 158 bpm for 4 beats. <1% isolated SVE,  couplet/triplets. 0 episodes of VT. <1% isolated VE, couplets. No atrial fibrillation/atrial flutter/SVT/VT/high grade AV block, sinus pause >3sec noted. 8 patient triggered events, correlated with sinus rhythm/VE.  PCV MYOCARDIAL PERFUSION WO LEXISCAN 08/17/2022  Narrative Lexiscan (with Bruce protocol) Nuclear stress test 08/17/2022: There is a small sized very mild reversible defect in the basal inferior-lateral region. Overall LV systolic function is normal without regional wall motion abnormalities. Stress LV EF: 63%. Non-diagnostic ECG stress due inability to achieve THR. Hence test converted to [facility] protocol. Patient exercised on a Bruce protocol. Achieved 71% of MPHR. The patient exercised for 6 minutes and 20 seconds achieving approximately 7.54 METs. Normal BP  response. No previous exam available for comparison. Low risk study.  EKG:   EKG 10/28/2022: Normal sinus rhythm at rate of 84 bpm, normal axis, no evidence of ischemia, normal EKG.  Compared to 05/27/2022, no change.   Recent labs:  Lab Results  Component Value Date   NA 136 11/08/2022   K 4.7 11/08/2022   CO2 20 11/08/2022   GLUCOSE 198 (H) 11/08/2022   BUN 18 11/08/2022   CREATININE 0.81 11/08/2022   CALCIUM 9.7 11/08/2022   EGFR 82 11/08/2022   GFRNONAA 96 11/11/2015    Lab Results  Component Value Date   CHOL 179 10/05/2022   HDL 64 10/05/2022   LDLCALC 98 10/05/2022   TRIG 92 10/05/2022    Labs 08/13/2021:  Total cholesterol 158, triglycerides 116, HDL 67, LDL 68.  Non-HDL cholesterol 91.  05/20/2022: Glucose 89, BUN/Cr 14/0.72. EGFR >90. Na/K 138/3.9. Rest of the CMP normal H/H 15/46. MCV 92. Platelets 306  HbA1C 6.1% TSH 2.3normal  Review of Systems  Cardiovascular:  Negative for chest pain, dyspnea on exertion and leg swelling.  Gastrointestinal:  Positive for bloating.   Vitals:   03/23/23 1455  BP: (!) 157/81  Pulse: 65  Resp: 16  SpO2: 98%     Body mass index is 25.19 kg/m. Filed Weights   03/23/23 1455  Weight: 142 lb 3.2 oz (64.5 kg)     Objective:   Physical Exam Neck:     Vascular: No carotid bruit or JVD.  Cardiovascular:     Rate and Rhythm: Normal rate and regular rhythm.     Pulses: Intact distal pulses.     Heart sounds: Normal heart sounds. No murmur heard.    No gallop.  Pulmonary:     Effort: Pulmonary effort is normal.     Breath sounds: Normal breath sounds.  Abdominal:     General: Bowel sounds are normal.     Palpations: Abdomen is soft.     Comments: Mild distention of the abdomen present.  Musculoskeletal:     Right lower leg: No edema.     Left lower leg: No edema.    Visit diagnoses:   ICD-10-CM   1. Essential hypertension  I10 spironolactone (ALDACTONE) 50 MG tablet    2. Pure hypercholesterolemia  E78.00      3. Elevated coronary artery calcium score  R93.1 CANCELED: EKG 12-Lead     No orders of the defined types were placed in this encounter.   Assessment & Recommendations:   Felicia Wilcox is a 62 y.o. Caucasian female with hypertension, hyperglycemia, elevated coronary calcium score in the 91 percentile, OSA on CPAP, Raynaud's syndrome presents for follow-up and management of hypertension, hyperlipidemia.  1. Essential hypertension Patient states that since increasing the dose of the Aldactone to 50 mg and adding Bystolic 20 mg daily, she has noticed her blood pressure to be very low in the afternoon to the point where she feels extremely dizzy and woozy and very fatigued.  As she has been tolerating spironolactone, complete resolution of leg edema, advised her to split the 50 mg pill 1 in the morning and 1 x 4 p.m. or 5 PM this patient will have sustained effect of blood pressure control and continue Bystolic at 20 mg daily in the evening.  She will let me know how her numbers look going forward.  As her blood pressure is very low in the afternoon I did not want to add or change her medications except change the timing.  With marked fluctuation in blood pressure, I also wonder if her CPAP needs to be titrated again.  She will contact her sleep medicine physician and discussed with him regarding this. - spironolactone (ALDACTONE) 50 MG tablet; Take 0.5 tablets (25 mg total) by mouth 2 (two) times daily at 10 am and 4 pm.  Dispense: 90 tablet; Refill: 1  2. Pure hypercholesterolemia Patient has elevated coronary calcium score, LDL goal <70.  She is getting her labs done by her PCP in the next 2 to 3 days.  Would add Zetia 10 mg daily if LDL is >70.  3. Elevated coronary artery calcium score In view of elevated coronary calcium score aggressive primary prevention is indicated.  As dictated above will target her LDL.  Otherwise she remains stable from cardiac standpoint, I will see her back  once or sooner if problems.     Yates Decamp, MD, [facility] 03/23/2023, 6:59 PM Office: [phone] Fax:  [phone] Pager: [phone]

## 2023-03-29 ENCOUNTER — Other Ambulatory Visit: Payer: Self-pay | Admitting: Psychiatry

## 2023-03-29 DIAGNOSIS — F4310 Post-traumatic stress disorder, unspecified: Secondary | ICD-10-CM

## 2023-03-29 DIAGNOSIS — F331 Major depressive disorder, recurrent, moderate: Secondary | ICD-10-CM

## 2023-03-29 DIAGNOSIS — G894 Chronic pain syndrome: Secondary | ICD-10-CM

## 2023-04-04 ENCOUNTER — Encounter: Payer: Self-pay | Admitting: Cardiology

## 2023-04-04 NOTE — Telephone Encounter (Signed)
Labs 03/17/2023:  A1c 6.9%.  TSH normal at 1.959.  Sodium 135, potassium 4.1, BUN 16, creatinine 0.85, EGFR 76,, LFTs normal.  Total cholesterol 174, triglycerides 298, HDL 58, LDL 80.  Hb 16.1/HCT 45.7, platelets 297.  Your labs suggest diabetes mellitus.  Your cholesterol was well-controlled on 10/05/2022, now the triglycerides have increased probably related to diabetes mellitus.  On 10/05/2022, your total cholesterol was 179, triglycerides 92, HDL 64 and LDL was 98.

## 2023-04-04 NOTE — Telephone Encounter (Signed)
From pt

## 2023-04-08 ENCOUNTER — Other Ambulatory Visit: Payer: Self-pay | Admitting: Psychiatry

## 2023-04-08 DIAGNOSIS — G894 Chronic pain syndrome: Secondary | ICD-10-CM

## 2023-04-08 DIAGNOSIS — F4310 Post-traumatic stress disorder, unspecified: Secondary | ICD-10-CM

## 2023-04-08 DIAGNOSIS — F331 Major depressive disorder, recurrent, moderate: Secondary | ICD-10-CM

## 2023-04-08 NOTE — Telephone Encounter (Signed)
Pt called today stating that this is the 3 time that the pharmacy has reached out. She is out and needs this filled today. She is out

## 2023-05-27 ENCOUNTER — Telehealth: Payer: Self-pay | Admitting: Family Medicine

## 2023-05-27 MED ORDER — METRONIDAZOLE 0.75 % VA GEL: VAGINAL | 1 refills | Status: DC

## 2023-05-27 NOTE — Telephone Encounter (Signed)
Felicia Wilcox called, she is a long time Dr. Clearance Coots patient.  She called requesting Metrogel be sent in.  This is a recurrent issue that Dr. Clearance Coots treats her for.   One refill routed to pharmacy.

## 2023-06-18 ENCOUNTER — Other Ambulatory Visit: Payer: Self-pay | Admitting: Cardiology

## 2023-06-18 DIAGNOSIS — I10 Essential (primary) hypertension: Secondary | ICD-10-CM

## 2023-06-20 ENCOUNTER — Telehealth: Payer: Self-pay | Admitting: Cardiology

## 2023-06-20 DIAGNOSIS — I10 Essential (primary) hypertension: Secondary | ICD-10-CM

## 2023-06-20 NOTE — Telephone Encounter (Signed)
Spoke with pt who reports no active symptoms at this time but does complain of dizziness with hearing a whooshing sound when she changes from sitting to standing.  This has been gong on for 2 months.  The patient does not routinely check her blood pressure.  She does state that she takes her medications as prescribed.  She states her symptoms improve in the afternoon.   Encouraged pt to check her BP regularly 2 hours after taking her morning medications and encouraged her to even do orthostatic blood pressures.(Explained to pt how to do this)  Encouraged hydration. Reviewed ED precautions.  Will forward message to Dr Jacinto Halim for review and further advisement.  Pt verbalizes understanding and agrees with current plan.

## 2023-06-20 NOTE — Telephone Encounter (Signed)
STAT if patient feels like he/she is going to faint   1. Are you feeling dizzy, lightheaded, or faint right now? No, but did have episode of dizziness today.     2. Have you passed out?  No    3. Do you have any other symptoms? Can hear "whooshing" noise in head that last for 15-30 mins, lips are white, & tingling in hands   4. Have you checked your HR and BP (record if available)? No    Has been occurring for past 2-3 months. Occurs when getting up from sitting for over 5-10 mins.   Patient states she will try to check BP next time a dizzy spell occurs.   She would like an appt to be seen in regards to this.  Please advise.

## 2023-06-20 NOTE — Telephone Encounter (Signed)
Patient returned RN's call confirming she is taking the spironolactone 0.5 tablet twice daily as listed in chart.

## 2023-06-20 NOTE — Telephone Encounter (Signed)
Chart indicates patient is taking  spironolactone (ALDACTONE) 50 MG tablet, Take 0.5 tablets (25 mg total) by mouth 2 (two) times daily at 10 am and 4 pm.,  Refill request is for Spironolactone 50 mg daily   Per last office note-  1. Essential hypertension Patient states that since increasing the dose of the Aldactone to 50 mg and adding Bystolic 20 mg daily, she has noticed her blood pressure to be very low in the afternoon to the point where she feels extremely dizzy and woozy and very fatigued.   As she has been tolerating spironolactone, complete resolution of leg edema, advised her to split the 50 mg pill 1 in the morning and 1 x 4 p.m. or 5 PM this patient will have sustained effect of blood pressure control and continue Bystolic at 20 mg daily in the evening.  She will let me know how her numbers look going forward.  As her blood pressure is very low in the afternoon I did not want to add or change her medications except change the timing   I placed call to patient to see how she has been taking.Left message to call office.  I checked with [facility] and they have Spironolactone 50 mg daily as medication to be refilled.  I let pharmacist know I have reached out to patient as chart indicates she has been taking differently

## 2023-06-20 NOTE — Telephone Encounter (Signed)
*  STAT* If patient is at the pharmacy, call can be transferred to refill team.   1. Which medications need to be refilled? (please list name of each medication and dose if known) spironolactone (ALDACTONE) 50 MG tablet    2. Which pharmacy/location (including street and city if local pharmacy) is medication to be sent to? [facility] - [place], [place] - [address]  3. Do they need a 30 day or 90 day supply?  90 day supply   Dominique with [facility] is requesting a status update on refill request. She states the patient was at the pharmacy when she called, so the patient may like an update as well.

## 2023-06-22 NOTE — Telephone Encounter (Signed)
Gree with the reassurance. I do not thinks she needs further evaluation. Thank you

## 2023-06-23 MED ORDER — SPIRONOLACTONE 50 MG PO TABS: 25.0000 mg | ORAL_TABLET | Freq: Two times a day (BID) | ORAL | 3 refills | Status: DC

## 2023-06-23 NOTE — Addendum Note (Signed)
Addended by: Lendon Ka on: 06/23/2023 10:38 AM   Modules accepted: Orders

## 2023-06-23 NOTE — Telephone Encounter (Signed)
Updated prescription to reflect.

## 2023-06-28 NOTE — Telephone Encounter (Signed)
Returned call to patient to let her know that no further evaluation needed per Dr. Jacinto Halim.  She is still going to check BP sitting then standing, record and let us know if significantly lower.   She said this does not happen to her on Saturday or Sunday mornings, only work days.  She said she described the visual change that she has when she walks through the building at work to her neurologist and he told her these are migraines.   Unsure if dizziness is part of that.  She thanked me for the follow up and will let us know how BPs are.

## 2023-06-28 NOTE — Telephone Encounter (Signed)
Patient called to follow-up on next steps regarding her dizziness.

## 2023-07-04 ENCOUNTER — Other Ambulatory Visit: Payer: Self-pay | Admitting: Psychiatry

## 2023-07-04 DIAGNOSIS — F331 Major depressive disorder, recurrent, moderate: Secondary | ICD-10-CM

## 2023-07-04 DIAGNOSIS — F4310 Post-traumatic stress disorder, unspecified: Secondary | ICD-10-CM

## 2023-07-04 DIAGNOSIS — G894 Chronic pain syndrome: Secondary | ICD-10-CM

## 2023-07-04 NOTE — Telephone Encounter (Signed)
Please call to schedule an appt, is past due.  

## 2023-08-06 ENCOUNTER — Encounter: Payer: Self-pay | Admitting: Cardiology

## 2023-08-29 ENCOUNTER — Encounter: Payer: Self-pay | Admitting: Psychiatry

## 2023-08-29 ENCOUNTER — Ambulatory Visit (INDEPENDENT_AMBULATORY_CARE_PROVIDER_SITE_OTHER): Payer: Medicare Other | Admitting: Psychiatry

## 2023-08-29 DIAGNOSIS — F4310 Post-traumatic stress disorder, unspecified: Secondary | ICD-10-CM | POA: Diagnosis not present

## 2023-08-29 DIAGNOSIS — F5105 Insomnia due to other mental disorder: Secondary | ICD-10-CM

## 2023-08-29 DIAGNOSIS — G894 Chronic pain syndrome: Secondary | ICD-10-CM | POA: Diagnosis not present

## 2023-08-29 DIAGNOSIS — F331 Major depressive disorder, recurrent, moderate: Secondary | ICD-10-CM

## 2023-08-29 MED ORDER — HYDROXYZINE HCL 10 MG PO TABS: 10.0000 mg | ORAL_TABLET | Freq: Three times a day (TID) | ORAL | 1 refills | Status: DC | PRN

## 2023-08-29 MED ORDER — NORTRIPTYLINE HCL 10 MG PO CAPS
10.0000 mg | ORAL_CAPSULE | Freq: Every day | ORAL | 1 refills | Status: DC
Start: 2023-08-29 — End: 2024-03-27

## 2023-08-29 MED ORDER — NORTRIPTYLINE HCL 25 MG PO CAPS
25.0000 mg | ORAL_CAPSULE | Freq: Every day | ORAL | 1 refills | Status: DC
Start: 2023-08-29 — End: 2024-03-27

## 2023-08-29 NOTE — Progress Notes (Signed)
Felicia Wilcox [id number] [birth year] 63 y.o.   Subjective:   Patient ID:  Felicia Wilcox is a 62 y.o. (DOB 21-Oct-1960) female.  Chief Complaint:  Chief Complaint  Patient presents with   Follow-up    Mood, anxiety, sleep, meds    HPI Felicia Wilcox presents to the office today for follow-up of depression and anxiety and chronic pain problems.    November 2019.  No meds were changed. Continued Fetzima 40mg .   10/2019 appt. Covid free.  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  Stress isolation.  Family far away.  Conflict over politics with friends and family.  Feels like everyone is in the same place.  Not really depression.  Fetzima helps pain. Patient reports stable mood and denies  irritable moods. Little dips into depression and wonders if there is going to be medication options for her given her history of med sensitivity..  Not gracefully aging.  Patient denies any recent difficulty with anxiety.  Patient denies difficulty with sleep initiation or maintenance with hydroxyzine.   Denies appetite disturbance.  Patient reports that energy and motivation have been good.  Patient denies any difficulty with concentration.  Patient denies any suicidal ideation.  Struggles never successful getting into a career.    05/22/21 appt noted: Stayed on Fetzima regularly.  Mirtazapine for sleep once or twice monthly and rare hydroxyzine. Life thrown me a whole lot. B died of alcoholism in September 23, 2020.  She went to take care of things. Got Covid there and lots of family.  Got [employer] and has long haul sx with relentless  medical problems since then.  Brain fog and anxiety at times. Had a lot of medical work up at [facility]. Wants to take a trial without Fetzima for awhile to see how she feels and also bc of some of the urinary hesitancy. Episodic anxiety and it is better now. Plan: per her request taper Fetzima by taking 20 mg alternating with 40 mg every other day for 2-4 weeks, Then reduce to 20 mg daily  for 2-4 weeks, Then reduce to 20 mg every other day for 2 weeks and then stop  11/23/21 appt moved up per her request: Off Fetzima. PT job in [employer] for a couple of years and box fell Jul 16 2021 and gave her concussion and workman's comp and other problems since then.  A lot of physical issues with it. Including migraines and dizziness and tingling in arms and legs.  Hard to stand for long.   More forgetful.   It has "ruined" me.  Seeing neurologist who gave her some meds.   Has taken trazodone or mirtazapine for sleep.   Workman's comp case denied. Can't do physical activities she did before. Work was good for her physically.  But can't do it the way she did before but needs to work until 62 yo. Is in counseling, Tessa at .  She dx PTSD Has to look for another place to live DT $ px. Plan: No meds RX We discussed the recent accident 06/2021 with concussion and the problems with getting it resolved.    12/23/2021 phone call: Patient says she is not motivated to do anything, her house is a mess, and she is eating all the time. Her quality of life is poor due to her physical condition. She is inside the majority of the time. She denies SI but says she thinks about not waking up. She feels like the concussion she had in October has  caused some neurologic changes. She has migraines and vertigo as a result of the concussion. Her spondylosis doesn't help.  She is taking trazodone from one of her doctors and said she sleeps well. She also uses a CPAP but says she is so tired in the morning. Due to urinary hesitancy she can't take the [facility]. She said GeneSight testing suggested another medication, but she doesn't remember what it was.  01/19/22 MD repsones: She had the Genesight test may 2019.  Because it is a genetic test that does not change so there is no point to repeating the test.  Unfortunately it does not tell you which medicines are going to work it only suggest which medicines you are likely to  have problems tolerating.  She is very med sensitive and has failed multiple medications.  I do not want to change medicines over the phone I need to see her for an appointment before I am going to want to change any medications.  You can put her on the cancellation list if she is not already on it  02/24/2022 appointment with the following noted: Trazodone works for sleep without hangover. Concussion ever changing.  Thinks anxiety is a part of the concussion.   Been through a lot in last couple of years.  Has attorney over the concussion case and is a lot of work. Chronic depression and pain. Plan: She agrees to trial of nortriptyline suspension starting at 2 mg and increasing as tolerated to 10 mg for pain and depression.  03/25/2022 appointment with the following noted: Thinks nortriptyline helping pain and some depression despite "horrific battle" with worker's comp, MCR etc. Mediation is on July 7.   Feels like she'll collapse any day. No se SE. Vertigo and HA from concussion. On nortriptyline 3 ml (10mg /23ml).  Wonders if she could go up in the dose. Asked if can take Lyrica and nortriptyline. Still needs trazodone for sleep.  No suicidal thoughts. Plan: She agrees to continue trial of nortriptyline suspension increase to 4 mL daily for about 4 days then if tolerated increase to 5 mL daily.  04/26/2022 phone call: She had increased nortriptyline suspension to 6 mL daily.  That was equivalent to 12 mg of nortriptyline.  She wanted to gradually to convert to a capsule so we talked about a plan to gradually increase the liquid until she got to 12 mL nightly at which point we could convert to a capsule. She had some questions about whether nortriptyline might be affecting her blood pressure and she was encouraged to continue monitoring it.  06/16/2022 appointment noted: Up to nortriptyline 10 ml for a couple of weeks. Tolerating it very well.  No mind fog from it like other  antidepressants. Feels it has helped some with the pain and mood is pretty good.  Getting better from concussion finally.  Mirgraines and vertigo less and better function.  Trying to exercise some. Still on Aimovig.  Seeing neuro. Some anxiety chronically.   Life situation is a little more stable and that has helped. SE dryness, but manageable. Not taking trazodone right now. Plan: Switch nortriptyline from 10 mL of suspension to 25 mg capsule 1 nightly. She can take trazodone as needed at the low dose that is typically used for sleep 50 mg.  07/29/22 TC:  Patient called to say that the 25 mg tablet of nortriptyline doesn't seem to be working as well as the suspension did. She said the suspension helped with depression and pain much better.  She is asking to go back to the liquid.    Take 15 mLs (30 mg total) by mouth at bedtime. Dispense: 473 mL, Refills: 0 ordered  This looks like the last dosing.     MD resp:  She may be right bc the capsule is 20% lower dose than the liquid.  So with the liquid she was getting 30 mg and now with capsule 25 mg.  Using the capsules we could add 10 mg capsule with the 25 mg capusle to equal 35 mg daily or we can go back to the liquid 30 mg .  Whichever she prefers.     09/28/22 appt noted: Taking 35 mg nortriptyline daily. Perfect I'm doing great on it.  Pleased with nortriptyline.  Helping her sleep. She quit smoking and also gained some weight and wonders if it is nortriptyline.   Now much better job and better paid.  Not doing physical work.  Is able to exercise  now.  Less tragedy and stress going on now.   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  Works for [employer].   Hard to say how much is related to the med and how much is related to the situation.   Is lonely.  But doesn't want drama either.  Satisfied with meds. Plan: Continue nortriptyline 35 mg HS DT benefit. She can take trazodone as needed at the low dose that is  typically used for sleep 50 mg.  08/29/23 appt noted: Taking 35 mg nortriptyline HS. Has counselor.  Says she catastrophisizes.   Situational anxiety.  Sometimes can't think straight and some panic and more anxious than should be.  Not all the time.  Doesn't want scheduled med.  Wants prn and before took hydroxyzine.   Saw pain clinic and wants something for pain.   Feels memory is worse since Covid twice and concussion.  Don't communicate like she used to do, formulation of sentences, staying on subject.   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   Remimazolam caused negative interaction with her.    Asks about whether Genesight testing could guide pain clinic selecting meds.   Disability since July 2020 and got Medicaid.  Can only have 22 doctor visits per year.  Chronic $ stress.   Past Psychiatric Medication Trials: Fetzima, duloxetine, sertraline abnormal periods, fluoxetine side effects, paroxetine brief side effects, citalopram, Lexapro 15,, venlafaxine flat,,  Mirtazapine ? effect, nefazodone,  Nortriptyline 35 mg  Wellbutrin no response buspirone,  Gabapentin Lyrica 100 stoned but helped Hydroxyzine, Ambien, Sonata, trazodone Provigil, lithium brief,  Pindolol,  Chantix Donepezil NM Med sensitive.  Review of Systems:  Review of Systems  Constitutional:  Positive for unexpected weight change.       Sweats  Genitourinary:  Negative for difficulty urinating.  Musculoskeletal:  Positive for arthralgias, back pain and neck pain.  Skin:        Hair loss  Neurological:  Positive for headaches. Negative for dizziness and tremors.  Psychiatric/Behavioral:  Negative for decreased concentration.     Medications: I have reviewed the patient's current medications.  Current Outpatient Medications  Medication Sig Dispense Refill   Estradiol-Norethindrone Acet 0.5-0.1 MG tablet Take 1 tablet by mouth daily. 30 tablet 12   famotidine (PEPCID) 20 MG  tablet Take 20 mg by mouth daily.     LINZESS 72 MCG capsule Take 72 mcg by mouth every morning.     nebivolol 20  MG TABS Take 1 tablet (20 mg total) by mouth daily. 90 tablet 3   nortriptyline (PAMELOR) 10 MG capsule Take 1 capsule by mouth at bedtime 90 capsule 1   nortriptyline (PAMELOR) 25 MG capsule Take 1 capsule by mouth at bedtime 90 capsule 1   rosuvastatin (CRESTOR) 10 MG tablet Take 1 tablet (10 mg total) by mouth daily. 90 tablet 3   spironolactone (ALDACTONE) 50 MG tablet Take 0.5 tablets (25 mg total) by mouth 2 (two) times daily. 90 tablet 3   hydrOXYzine (ATARAX) 10 MG tablet Take 1 tablet (10 mg total) by mouth 3 (three) times daily as needed. 30 tablet 1   metroNIDAZOLE (METROGEL) 0.75 % vaginal gel Place 1 Applicatorful vaginally 2 (two) times daily for 5 days (Patient not taking: Reported on 08/29/2023) 100 g 1   No current facility-administered medications for this visit.    Medication Side Effects: Other: possibly see above  Allergies:  Allergies  Allergen Reactions   Aspartame And Phenylalanine Anaphylaxis   Duloxetine Hcl Other (See Comments)    Other reaction(s): felt like a zombie Other reaction(s): felt like a zombie    Sertraline Hcl Other (See Comments)   Codeine     "Bleed internally"    Sulfa Antibiotics Nausea And Vomiting    Past Medical History:  Diagnosis Date   Anxiety    Arthritis    2 herniated disc lower back   Chronic lower back pain    GERD (gastroesophageal reflux disease)    Hyperlipidemia    Hypertension    Sleep apnea    mild - does not use CPAP machine   Smoker     Family History  Problem Relation Age of Onset   Heart disease Mother    Cancer Father    Stroke Sister    Heart disease Sister    Diabetes Maternal Grandmother    Breast cancer Neg Hx     Social History   Socioeconomic History   Marital status: Single    Spouse name: Not on file   Number of children: 1   Years of education: BA   Highest education  level: Not on file  Occupational History   Not on file  Tobacco Use   Smoking status: Former    Current packs/day: 0.00    Average packs/day: 0.5 packs/day for 6.0 years (3.0 ttl pk-yrs)    Types: Cigarettes    Start date: 2013    Quit date: 2019    Years since quitting: 5.9   Smokeless tobacco: Never  Vaping Use   Vaping status: Former   Quit date: 12/28/2017  Substance and Sexual Activity   Alcohol use: No    Alcohol/week: 0.0 standard drinks of alcohol    Comment: Quits 1 month ago.   Drug use: No   Sexual activity: Not Currently    Birth control/protection: Post-menopausal  Other Topics Concern   Not on file  Social History Narrative   Drinks 2-3 cups of coffee a day    Social Determinants of Health   Financial Resource Strain: Not on file  Food Insecurity: Unknown (05/18/2022)   [facility]   Hunger Vital Sign    Worried About Running Out of Food in the Last Year: Not on file    Ran Out of Food in the Last Year: Patient declined  Transportation Needs: No Transportation Needs (05/18/2022)   [facility] prior to 11/27/2022.,  [facility] prior to 11/27/2022.   PRAPARE - Administrator, Civil Service (Medical): No    Lack of Transportation (Non-Medical): No  Physical Activity: Not on file  Stress: Not on file  Social Connections: Unknown (01/29/2022)   [facility] from [facility]   Social Network    Social Network: Not on file  Intimate Partner Violence: Not At Risk (04/07/2023)   Received from [facility]   HITS    Over the last 12 months how often did your partner physically hurt you?: Never    Over the last 12 months how often did your partner insult you or talk down to you?: Never    Over the last 12 months how often did your partner threaten you with physical harm?: Never    Over the last 12  months how often did your partner scream or curse at you?: Never    Past Medical History, Surgical history, Social history, and Family history were reviewed and updated as appropriate.   Please see review of systems for further details on the patient's review from today.   Objective:   Physical Exam:  There were no vitals taken for this visit.  Physical Exam Constitutional:      General: She is not in acute distress.    Appearance: She is well-developed.  Musculoskeletal:        General: No deformity.  Neurological:     Mental Status: She is alert and oriented to person, place, and time.     Coordination: Coordination normal.  Psychiatric:        Attention and Perception: Attention and perception normal. She does not perceive auditory or visual hallucinations.        Mood and Affect: Mood is anxious. Mood is not depressed. Affect is not labile, blunt, angry or inappropriate.        Speech: Speech normal.        Behavior: Behavior normal.        Thought Content: Thought content normal. Thought content is not paranoid or delusional. Thought content does not include homicidal or suicidal ideation. Thought content does not include suicidal plan.        Cognition and Memory: Cognition and memory normal.        Judgment: Judgment normal.     Comments: Depression is improved with the more stable living situation and 35 mg of nortriptyline       Lab Review:     Component Value Date/Time   NA 136 11/08/2022 0847   K 4.7 11/08/2022 0847   CL 99 11/08/2022 0847   CO2 20 11/08/2022 0847   GLUCOSE 198 (H) 11/08/2022 0847   BUN 18 11/08/2022 0847   CREATININE 0.81 11/08/2022 0847   CALCIUM 9.7 11/08/2022 0847   PROT 6.6 11/11/2015 1005   ALBUMIN 4.7 11/11/2015 1005   AST 17 11/11/2015 1005   ALT 34 (H) 11/11/2015 1005   ALKPHOS 44 11/11/2015 1005   BILITOT 0.2 11/11/2015 1005   GFRNONAA 96 11/11/2015 1005   GFRAA 110 11/11/2015 1005       Component Value Date/Time   WBC  7.7 07/31/2020 1324   WBC 5.8 09/13/2014 1205   RBC 5.52 (H) 07/31/2020 1324   RBC 4.37 09/13/2014 1205   HGB 17.2 (H) 07/31/2020 1324   HCT 51.2 (H) 07/31/2020 1324   PLT 337 07/31/2020 1324   MCV 93 07/31/2020 1324   MCH 31.2  07/31/2020 1324   MCH 33.9 09/13/2014 1205   MCHC 33.6 07/31/2020 1324   MCHC 35.1 09/13/2014 1205   RDW 12.2 07/31/2020 1324   LYMPHSABS 1.9 07/31/2020 1324   EOSABS 0.1 07/31/2020 1324   BASOSABS 0.1 07/31/2020 1324    No results found for: "POCLITH", "LITHIUM"   No results found for: "PHENYTOIN", "PHENOBARB", "VALPROATE", "CBMZ"   Genesight testing Jan 31, 2018: SLC 6A4 intermediate response, HDR2A increased sensitivity to adverse effects, CYP enzymes as follows: 1A2 ultra-rapid metabolizer, 2 D6 intermediate metabolizer, U GT1A4 ultra-rapid, The other enzymes were normal.  Folic enzyme conversion was normal  .res Assessment: Plan:    Daicy was seen today for follow-up.  Diagnoses and all orders for this visit:  Major depressive disorder, recurrent episode, moderate (HCC)  PTSD (post-traumatic stress disorder) -     hydrOXYzine (ATARAX) 10 MG tablet; Take 1 tablet (10 mg total) by mouth 3 (three) times daily as needed.  Insomnia due to mental condition  Chronic pain syndrome    30 min face to face time with patient was spent on counseling and coordination of care. Extremely med sensitive.  Long history of difficulty finding a medication that she could tolerate that would help her.  Doing well with low dose nortriptyline.  Not clear as to whether Genesight would help guide pain clinic.  She has a copy of the result.  She has tolerated nortriptyline much better than other antidepressants.  She does not feel that it alters her personality or makes her foggy the way other antidepressants have done in the past.  She is getting some pain benefit from it as well.  Also the concussion symptoms seem to be improving either due to time or other  treatment interventions or perhaps being helped by the nortriptyline. She is satisfied with the current dosage     We discussed side effects at length and so far she is tolerating it well.   Disc extensiviely .  Disc SE Continue nortriptyline 35 mg HS DT benefit.  (NAC) N-Acetylcysteine 2 of the  600 mg capsules daily to help with mild cognitive problems.  It can be combined with a B-complex vitamin as the B-12 and folate which can sometimes enhance the effect.  Option memantine for memory concerns  FU 3 to 4 months as long as she is doing okay and earlier as needed  Meredith Staggers MD, DFAPA  Please see After Visit Summary for patient specific instructions.  Future Appointments  Date Time Provider Department Center  11/08/2023  3:00 PM Yates Decamp, MD [id number]     No orders of the defined types were placed in this encounter.    -------------------------------

## 2023-08-29 NOTE — Patient Instructions (Addendum)
(  NAC) N-Acetylcysteine 2 of the  600 mg capsules daily to help with mild cognitive problems.  It can be combined with a B-complex vitamin as the B-12 and folate which can sometimes enhance the effect.  Option Memantine (Namenda) for memory

## 2023-09-01 ENCOUNTER — Telehealth: Payer: Self-pay | Admitting: Psychiatry

## 2023-09-08 ENCOUNTER — Encounter (HOSPITAL_COMMUNITY): Payer: Self-pay | Admitting: Specialist

## 2023-09-12 ENCOUNTER — Ambulatory Visit: Payer: Self-pay | Admitting: Cardiology

## 2023-09-13 ENCOUNTER — Other Ambulatory Visit: Payer: Self-pay

## 2023-09-13 DIAGNOSIS — F4310 Post-traumatic stress disorder, unspecified: Secondary | ICD-10-CM

## 2023-09-13 MED ORDER — HYDROXYZINE HCL 10 MG PO TABS: 10.0000 mg | ORAL_TABLET | Freq: Three times a day (TID) | ORAL | 1 refills | Status: DC | PRN

## 2023-09-13 NOTE — Telephone Encounter (Signed)
Pt lvm that the hydroxyzine script is wrong  she takes three a day and the quanitty needs to be changed. Please call her at [phone]

## 2023-09-13 NOTE — Telephone Encounter (Signed)
 Lvm to rc

## 2023-09-13 NOTE — Telephone Encounter (Signed)
Spoke with patient, she did pu the rx for 30. I sent in a rx for qty 60 to requested pharmacy.

## 2023-09-14 ENCOUNTER — Other Ambulatory Visit (HOSPITAL_COMMUNITY): Payer: Self-pay | Admitting: Specialist

## 2023-09-14 DIAGNOSIS — F0280 Dementia in other diseases classified elsewhere without behavioral disturbance: Secondary | ICD-10-CM

## 2023-11-08 ENCOUNTER — Ambulatory Visit: Payer: Medicare Other | Attending: Cardiology | Admitting: Cardiology

## 2023-11-08 ENCOUNTER — Encounter: Payer: Self-pay | Admitting: Cardiology

## 2023-11-08 VITALS — BP 142/87 | HR 67 | Resp 16 | Ht 63.0 in | Wt 137.6 lb

## 2023-11-08 DIAGNOSIS — I952 Hypotension due to drugs: Secondary | ICD-10-CM | POA: Diagnosis not present

## 2023-11-08 DIAGNOSIS — G4733 Obstructive sleep apnea (adult) (pediatric): Secondary | ICD-10-CM | POA: Diagnosis present

## 2023-11-08 DIAGNOSIS — I10 Essential (primary) hypertension: Secondary | ICD-10-CM | POA: Diagnosis present

## 2023-11-08 DIAGNOSIS — E7800 Pure hypercholesterolemia, unspecified: Secondary | ICD-10-CM | POA: Diagnosis present

## 2023-11-08 DIAGNOSIS — R931 Abnormal findings on diagnostic imaging of heart and coronary circulation: Secondary | ICD-10-CM

## 2023-11-08 MED ORDER — ROSUVASTATIN CALCIUM 10 MG PO TABS: 10.0000 mg | ORAL_TABLET | Freq: Every day | ORAL | 3 refills | Status: AC

## 2023-11-08 NOTE — Progress Notes (Signed)
Cardiology Office Note:  .   Date:  11/08/2023  ID:  Felicia Wilcox, DOB [birth year], MRN [id number] PCP: [facility] Providers Cardiologist:  Yates Decamp, MD   History of Present Illness: .   Felicia Wilcox is a 63 y.o. Caucasian female with hypertension, hyperglycemia, elevated coronary calcium score in the 91 percentile, OSA on CPAP and compliant, Raynaud's syndrome presents for annual visit.  Discussed the use of AI scribe software for clinical note transcription with the patient, who gave verbal consent to proceed.  History of Present Illness   The patient, with a history of hypertension, high cholesterol, and sleep apnea, presents with symptoms suggestive of orthostatic hypotension. She describes a sensation of feeling like she's going to pass out when transitioning from a sitting to standing position. This has been ongoing for 3-4 weeks and has not improved despite discontinuing memantine and nifedipine <a week ago, which were prescribed by her neurologist for memory issues and Raynaud's respectively.   The patient also reports feeling excessively tired, sleeping for 9-10 hours a night, and still feeling fatigued upon waking. She has been using a CPAP machine for sleep apnea, but usage has been inconsistent due to issues with the machine. The patient also reports concerns about her memory and has been experiencing headaches, particularly in the back of the head. She has noticed visual disturbances when feeling like she's going to pass out, seeing colors around the edges of her vision.   She had stopped taking Crestor as well as she was feeling poorly recently and is wondering whether she should restart the medication.  Denies chest pain, dyspnea, PND or orthopnea.    Labs   Lab Results   Component Value Date   CHOL 179 10/05/2022   HDL 64 10/05/2022   LDLCALC 98 10/05/2022   TRIG 92 10/05/2022   External Labs:  Care Everywhere labs 07/25/2023:  A1c 6.4%.  Sodium 140, potassium 4.2, BUN 14, creatinine 0.76, EGFR 87 mL.  Total cholesterol 116, triglycerides 68, HDL 57, LDL 45.  Labs 10/27/2023:  TSH normal at 1.758.  Vitamin D 36.1.  B12 809, iron panel normal.  Review of Systems  Cardiovascular:  Negative for chest pain, dyspnea on exertion and leg swelling.  Neurological:  Positive for dizziness.   Physical Exam:   VS:  BP (!) 142/87 (BP Location: Left Arm, Patient Position: Sitting, Cuff Size: Normal)   Pulse 67   Resp 16   Ht 5\' 3"  (1.6 m)   Wt 137 lb 9.6 oz (62.4 kg)   SpO2 99%   BMI 24.37 kg/m    Wt Readings from Last 3 Encounters:  11/08/23 137 lb 9.6 oz (62.4 kg)  03/23/23 142 lb 3.2 oz (64.5 kg)  03/15/23 142 lb 11.2 oz (64.7 kg)    Orthostatic Vitals for the past 48 hrs (Last 6 readings):  Patient Position Orthostatic BP Orthostatic Pulse BP Pulse BP Location Cuff Size  11/08/23 1541 Sitting -- -- (!) 142/87 67 Left Arm Normal  11/08/23 1542 Supine (!) 160/95 66 -- -- Left Arm Normal  11/08/23 1543 Sitting 151/84 64 -- -- Left Arm Normal  11/08/23 1544 Standing 137/78 64 -- -- Left Arm Normal   Physical Exam Neck:     Vascular: No carotid bruit or JVD.  Cardiovascular:     Rate and Rhythm: Normal rate and regular rhythm.     Pulses: Intact distal pulses.     Heart sounds: Normal heart  sounds. No murmur heard.    No gallop.  Pulmonary:     Effort: Pulmonary effort is normal.     Breath sounds: Normal breath sounds.  Abdominal:     General: Bowel sounds are normal.     Palpations: Abdomen is soft.  Musculoskeletal:     Right lower leg: No edema.     Left lower leg: No edema.    Studies Reviewed: .    NA EKG:    EKG Interpretation Date/Time:  Tuesday November 08 2023 15:28:01 EST Ventricular Rate:  66 PR  Interval:  160 QRS Duration:  88 QT Interval:  382 QTC Calculation: 400 R Axis:   7  Text Interpretation: EKG 11/08/2023: Normal sinus rhythm at rate of 66 bpm, normal EKG. Confirmed by Delrae Rend [id number]) on 11/08/2023 3:46:23 PM    Medications and allergies    Allergies  Allergen Reactions   Aspartame And Phenylalanine Anaphylaxis   Duloxetine Hcl Other (See Comments)    Other reaction(s): felt like a zombie Other reaction(s): felt like a zombie    Sertraline Hcl Other (See Comments)   Codeine     "Bleed internally"    Sulfa Antibiotics Nausea And Vomiting     Current Outpatient Medications:    ACETYLCARNITINE HCL, NUTRIENT, PO, Take by mouth., Disp: , Rfl:    B Complex-Biotin-FA (B COMPLETE PO), Take by mouth., Disp: , Rfl:    hydrOXYzine (ATARAX) 10 MG tablet, Take 1 tablet (10 mg total) by mouth 3 (three) times daily as needed., Disp: 60 tablet, Rfl: 1   MAGNESIUM LACTATE PO, Take by mouth., Disp: , Rfl:    nebivolol 20 MG TABS, Take 1 tablet (20 mg total) by mouth daily., Disp: 90 tablet, Rfl: 3   nortriptyline (PAMELOR) 10 MG capsule, Take 1 capsule (10 mg total) by mouth at bedtime., Disp: 90 capsule, Rfl: 1   nortriptyline (PAMELOR) 25 MG capsule, Take 1 capsule (25 mg total) by mouth at bedtime., Disp: 90 capsule, Rfl: 1   omega-3 acid ethyl esters (LOVAZA) 1 g capsule, Take 1 capsule by mouth daily., Disp: , Rfl:    Probiotic Product (PROBIOTIC DAILY PO), Take by mouth., Disp: , Rfl:    rosuvastatin (CRESTOR) 10 MG tablet, Take 1 tablet (10 mg total) by mouth daily., Disp: 90 tablet, Rfl: 3   spironolactone (ALDACTONE) 50 MG tablet, Take 0.5 tablets (25 mg total) by mouth 2 (two) times daily., Disp: 90 tablet, Rfl: 3   traMADol (ULTRAM) 50 MG tablet, Take 50 mg by mouth every 6 (six) hours as needed., Disp: , Rfl:    metroNIDAZOLE (METROGEL) 0.75 % vaginal gel, Place 1 Applicatorful vaginally 2 (two) times daily for 5 days (Patient not taking: Reported on  11/08/2023), Disp: 100 g, Rfl: 1   ASSESSMENT AND PLAN: .      ICD-10-CM   1. Hypotension due to drugs  I95.2     2. Essential hypertension  I10 EKG 12-Lead    3. Elevated coronary artery calcium score 10/2020: Total score 172, MESA database percentile 91.  R93.1     4. Pure hypercholesterolemia  E78.00 rosuvastatin (CRESTOR) 10 MG tablet    5. OSA on CPAP  G47.33      Assessment and Plan    Orthostatic Hypotension and supine hypertension Orthostatic hypotension is likely due to interactions between nifedipine, memantine, and nortriptyline, causing dizziness, near-syncope upon standing, and visual disturbances. Blood pressure readings show significant drops from supine to standing positions. Risks of untreated  orthostatic hypotension, such as falls and potential injury, were discussed.  Since she has discontinued nifedipine and also memantine, improvement is expected with time and monitoring. Blood pressure should be monitored in supine, sitting, and standing positions every 2-3 days. Use thigh-high support stockings, dangle feet or perform light exercises before standing, and recline slightly when sleeping.   Continue spironolactone 0.5 tablet in the morning and afternoon, and nebivolol 20 mg in the evening. Follow up in 2 weeks via MyChart if symptoms persist.  Supine hypertension Hypertension with supine readings up to 160/97 mmHg is complicated by orthostatic hypotension. Current medications include spironolactone and nebivolol. The risks of untreated hypertension, such as heart attacks and strokes, and the need to balance treatment to avoid exacerbating orthostatic hypotension were discussed. Continue spironolactone 0.5 tablet in the morning and afternoon, and nebivolol 20 mg in the evening. Regular blood pressure monitoring is advised.  Advised her to sleep reclined to decrease the effects of hypertension on CNS.  Elevated coronary calcium score in the 91st percentile and  hypercholesterolemia Hyperlipidemia is well-controlled with statin therapy, with a recent LDL level of 45 mg/dL. The importance of continuing statin therapy to reduce cardiovascular risk was discussed. Restart Crestor (rosuvastatin) 10 mg daily and send a prescription for Crestor.  She had discontinued this recently as she was having significant dizziness when she stood up.  Advised her that symptoms are not related to statins.  Sleep Apnea Sleep apnea is managed with CPAP therapy, though inconsistent use is reported due to equipment issues. The importance of consistent CPAP use for improving sleep quality and reducing daytime fatigue was discussed.    Prediabetes Prediabetes is indicated by recent labs showing borderline glucose levels. Enrollment in a diabetes prevention class was discussed, emphasizing the importance of lifestyle modifications to prevent progression to diabetes. Continue the diabetes prevention class and monitor blood glucose levels.  General Health Maintenance The importance of smokin g cessation and regular monitoring of health parameters was discussed. The risks of nicotine use, including vaping, were highlighted, and cessation was encouraged. Use MyChart for communication and follow-up.  Follow-up Follow up in 6 months for orthostatic hypotension. Use MyChart for interim updates and concerns.    Signed,  Yates Decamp, MD, [facility] 11/08/2023, 5:31 PM [facility] [address] [place], [place] [address] Phone: [phone]. Fax:  [phone]

## 2023-11-08 NOTE — Patient Instructions (Signed)
Medication Instructions:  Your physician has recommended you make the following change in your medication:  Start Rosuvastatin 10 mg by mouth daily   *If you need a refill on your cardiac medications before your next appointment, please call your pharmacy*   Lab Work: none If you have labs (blood work) drawn today and your tests are completely normal, you will receive your results only by: MyChart Message (if you have MyChart) OR A paper copy in the mail If you have any lab test that is abnormal or we need to change your treatment, we will call you to review the results.   Testing/Procedures: none   Follow-Up: At [facility], you and your health needs are our priority.  As part of our continuing mission to provide you with exceptional heart care, we have created designated Provider Care Teams.  These Care Teams include your primary Cardiologist (physician) and Advanced Practice Providers (APPs -  Physician Assistants and Nurse Practitioners) who all work together to provide you with the care you need, when you need it.  We recommend signing up for the patient portal called "MyChart".  Sign up information is provided on this After Visit Summary.  MyChart is used to connect with patients for Virtual Visits (Telemedicine).  Patients are able to view lab/test results, encounter notes, upcoming appointments, etc.  Non-urgent messages can be sent to your provider as well.   To learn more about what you can do with MyChart, go to [email or web address].    Your next appointment:   6 month(s)  Provider:   Dr Jacinto Halim    Other Instructions

## 2023-12-11 ENCOUNTER — Other Ambulatory Visit: Payer: Self-pay | Admitting: Cardiology

## 2023-12-11 DIAGNOSIS — I10 Essential (primary) hypertension: Secondary | ICD-10-CM

## 2023-12-13 ENCOUNTER — Other Ambulatory Visit: Payer: Self-pay

## 2023-12-13 DIAGNOSIS — I10 Essential (primary) hypertension: Secondary | ICD-10-CM

## 2023-12-13 MED ORDER — NEBIVOLOL HCL 20 MG PO TABS: 20.0000 mg | ORAL_TABLET | Freq: Every day | ORAL | 3 refills | Status: DC

## 2024-02-27 ENCOUNTER — Ambulatory Visit: Payer: Medicare Other | Admitting: Psychiatry

## 2024-03-27 ENCOUNTER — Ambulatory Visit: Admitting: Psychiatry

## 2024-03-27 ENCOUNTER — Encounter: Payer: Self-pay | Admitting: Psychiatry

## 2024-03-27 DIAGNOSIS — F4310 Post-traumatic stress disorder, unspecified: Secondary | ICD-10-CM | POA: Diagnosis not present

## 2024-03-27 DIAGNOSIS — G894 Chronic pain syndrome: Secondary | ICD-10-CM | POA: Diagnosis not present

## 2024-03-27 DIAGNOSIS — F331 Major depressive disorder, recurrent, moderate: Secondary | ICD-10-CM | POA: Diagnosis not present

## 2024-03-27 DIAGNOSIS — F5105 Insomnia due to other mental disorder: Secondary | ICD-10-CM

## 2024-03-27 MED ORDER — NORTRIPTYLINE HCL 25 MG PO CAPS: 25.0000 mg | ORAL_CAPSULE | Freq: Every day | ORAL | 1 refills | Status: AC

## 2024-03-27 MED ORDER — NORTRIPTYLINE HCL 10 MG PO CAPS: 10.0000 mg | ORAL_CAPSULE | Freq: Every day | ORAL | 1 refills | Status: AC

## 2024-03-27 NOTE — Patient Instructions (Signed)
[employer]

## 2024-03-27 NOTE — Progress Notes (Signed)
 RINI MOFFIT [id number] June 03, 1961 63 y.o.   Subjective:   Patient ID:  ARMANDA FORAND is a 63 y.o. (DOB Mar 30, 1961) female.  Chief Complaint:  Chief Complaint  Patient presents with   Follow-up   Depression   Anxiety    HPI MANON BANBURY presents to the office today for follow-up of depression and anxiety and chronic pain problems.    November 2019.  No meds were changed. Continued Fetzima  40mg .   10/2019 appt. Covid free.  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  Stress isolation.  Family far away.  Conflict over politics with friends and family.  Feels like everyone is in the same place.  Not really depression.  Fetzima  helps pain. Patient reports stable mood and denies  irritable moods. Little dips into depression and wonders if there is going to be medication options for her given her history of med sensitivity..  Not gracefully aging.  Patient denies any recent difficulty with anxiety.  Patient denies difficulty with sleep initiation or maintenance with hydroxyzine .   Denies appetite disturbance.  Patient reports that energy and motivation have been good.  Patient denies any difficulty with concentration.  Patient denies any suicidal ideation.  Struggles never successful getting into a career.    05/22/21 appt noted: Stayed on Fetzima  regularly.  Mirtazapine for sleep once or twice monthly and rare hydroxyzine . Life thrown me a whole lot. B died of alcoholism in 04-16-20.  She went to take care of things. Got Covid there and lots of family.  Got [employer] and has long haul sx with relentless  medical problems since then.  Brain fog and anxiety at times. Had a lot of medical work up at [facility]. Wants to take a trial without Fetzima  for awhile to see how she feels and also bc of some of the urinary hesitancy. Episodic anxiety and it is better now. Plan: per her request taper Fetzima  by taking 20 mg alternating with 40 mg every other day for 2-4 weeks, Then reduce to 20 mg daily for 2-4  weeks, Then reduce to 20 mg every other day for 2 weeks and then stop  11/23/21 appt moved up per her request: Off Fetzima . PT job in [employer] for a couple of years and box fell Jul 16 2021 and gave her concussion and workman's comp and other problems since then.  A lot of physical issues with it. Including migraines and dizziness and tingling in arms and legs.  Hard to stand for long.   More forgetful.   It has ruined me.  Seeing neurologist who gave her some meds.   Has taken trazodone or mirtazapine for sleep.   Workman's comp case denied. Can't do physical activities she did before. Work was good for her physically.  But can't do it the way she did before but needs to work until 63 yo. Is in counseling, Tessa at .  She dx PTSD Has to look for another place to live DT $ px. Plan: No meds RX We discussed the recent accident 06/2021 with concussion and the problems with getting it resolved.    12/23/2021 phone call: Patient says she is not motivated to do anything, her house is a mess, and she is eating all the time. Her quality of life is poor due to her physical condition. She is inside the majority of the time. She denies SI but says she thinks about not waking up. She feels like the concussion she had in October has caused  some neurologic changes. She has migraines and vertigo as a result of the concussion. Her spondylosis doesn't help.  She is taking trazodone from one of her doctors and said she sleeps well. She also uses a CPAP but says she is so tired in the morning. Due to urinary hesitancy she can't take the Fetzima . She said GeneSight testing suggested another medication, but she doesn't remember what it was.  01/19/22 MD repsones: She had the Genesight test may 2019.  Because it is a genetic test that does not change so there is no point to repeating the test.  Unfortunately it does not tell you which medicines are going to work it only suggest which medicines you are likely to have  problems tolerating.  She is very med sensitive and has failed multiple medications.  I do not want to change medicines over the phone I need to see her for an appointment before I am going to want to change any medications.  You can put her on the cancellation list if she is not already on it  02/24/2022 appointment with the following noted: Trazodone works for sleep without hangover. Concussion ever changing.  Thinks anxiety is a part of the concussion.   Been through a lot in last couple of years.  Has attorney over the concussion case and is a lot of work. Chronic depression and pain. Plan: She agrees to trial of nortriptyline  suspension starting at 2 mg and increasing as tolerated to 10 mg for pain and depression.  03/25/2022 appointment with the following noted: Thinks nortriptyline  helping pain and some depression despite horrific battle with worker's comp, MCR etc. Mediation is on July 7.   Feels like she'll collapse any day. No se SE. Vertigo and HA from concussion. On nortriptyline  3 ml (10mg /63ml).  Wonders if she could go up in the dose. Asked if can take Lyrica and nortriptyline . Still needs trazodone for sleep.  No suicidal thoughts. Plan: She agrees to continue trial of nortriptyline  suspension increase to 4 mL daily for about 4 days then if tolerated increase to 5 mL daily.  04/26/2022 phone call: She had increased nortriptyline  suspension to 6 mL daily.  That was equivalent to 12 mg of nortriptyline .  She wanted to gradually to convert to a capsule so we talked about a plan to gradually increase the liquid until she got to 12 mL nightly at which point we could convert to a capsule. She had some questions about whether nortriptyline  might be affecting her blood pressure and she was encouraged to continue monitoring it.  06/16/2022 appointment noted: Up to nortriptyline  10 ml for a couple of weeks. Tolerating it very well.  No mind fog from it like other antidepressants. Feels it  has helped some with the pain and mood is pretty good.  Getting better from concussion finally.  Mirgraines and vertigo less and better function.  Trying to exercise some. Still on Aimovig.  Seeing neuro. Some anxiety chronically.   Life situation is a little more stable and that has helped. SE dryness, but manageable. Not taking trazodone right now. Plan: Switch nortriptyline  from 10 mL of suspension to 25 mg capsule 1 nightly. She can take trazodone as needed at the low dose that is typically used for sleep 50 mg.  07/29/22 TC:  Patient called to say that the 25 mg tablet of nortriptyline  doesn't seem to be working as well as the suspension did. She said the suspension helped with depression and pain much better. She  is asking to go back to the liquid.    Take 15 mLs (30 mg total) by mouth at bedtime. Dispense: 473 mL, Refills: 0 ordered  This looks like the last dosing.     MD resp:  She may be right bc the capsule is 20% lower dose than the liquid.  So with the liquid she was getting 30 mg and now with capsule 25 mg.  Using the capsules we could add 10 mg capsule with the 25 mg capusle to equal 35 mg daily or we can go back to the liquid 30 mg .  Whichever she prefers.     09/28/22 appt noted: Taking 35 mg nortriptyline  daily. Perfect I'm doing great on it.  Pleased with nortriptyline .  Helping her sleep. She quit smoking and also gained some weight and wonders if it is nortriptyline .   Now much better job and better paid.  Not doing physical work.  Is able to exercise  now.  Less tragedy and stress going on now.   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  Works for [employer].   Hard to say how much is related to the med and how much is related to the situation.   Is lonely.  But doesn't want drama either.  Satisfied with meds. Plan: Continue nortriptyline  35 mg HS DT benefit. She can take trazodone as needed at the low dose that is typically used for sleep 50  mg.  08/29/23 appt noted: Taking 35 mg nortriptyline  HS. Has counselor.  Says she catastrophisizes.   Situational anxiety.  Sometimes can't think straight and some panic and more anxious than should be.  Not all the time.  Doesn't want scheduled med.  Wants prn and before took hydroxyzine .   Saw pain clinic and wants something for pain.   Feels memory is worse since Covid twice and concussion.  Don't communicate like she used to do, formulation of sentences, staying on subject.   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   alprazolam caused negative interaction with her.    Asks about whether Genesight testing could guide pain clinic selecting meds. Plan: add prn hydroxyzine   03/27/24 appt noted:  Med: 35 mg nortriptyline  HS.  Also on buprenorphine 10 mcg patch  Has DJD.  Patch helped with pain.  Has recommended surgery but she's getting third opinion. Has gotten 2 diff recommendations for different surgeries.   Tired of being alone.  Single for a long time.    SE U hesitation.  But could be an issue with bladder from pinched nerves.   Pain patch helps lower pain if doesn't do anything to aggrivate back.   Pain can interfere with sleep.     Disability since July 2020 and got Medicaid.  Can only have 22 doctor visits per year.  Chronic $ stress.   Past Psychiatric Medication Trials:  Fetzima , duloxetine, sertraline abnormal periods, fluoxetine side effects, paroxetine brief side effects, citalopram, Lexapro 15,, venlafaxine flat,,  Mirtazapine ? effect, nefazodone,  Nortriptyline  35 mg  Wellbutrin no response buspirone,  Gabapentin Lyrica 100 stoned but helped Hydroxyzine , Ambien, Sonata, trazodone Provigil, lithium brief,  Pindolol,  Chantix Donepezil NM Med sensitive.  remimazolam caused negative interaction with her.     Review of Systems:  Review of Systems  Constitutional:  Positive for unexpected weight change.       Sweats  Genitourinary:   Positive for difficulty urinating.  Musculoskeletal:  Positive for arthralgias, back pain and neck pain.  Neurological:  Positive for headaches. Negative for dizziness and tremors.  Psychiatric/Behavioral:  Negative for decreased concentration.     Medications: I have reviewed the patient's current medications.  Current Outpatient Medications  Medication Sig Dispense Refill   ACETYLCARNITINE HCL, NUTRIENT, PO Take by mouth.     B Complex-Biotin-FA (B COMPLETE PO) Take by mouth.     buprenorphine (BUTRANS) 10 MCG/HR PTWK Place 1 patch onto the skin once a week.     hydrOXYzine  (ATARAX ) 10 MG tablet Take 1 tablet (10 mg total) by mouth 3 (three) times daily as needed. 60 tablet 1   MAGNESIUM LACTATE PO Take by mouth.     Nebivolol  HCl 20 MG TABS Take 1 tablet (20 mg total) by mouth daily. 90 tablet 3   omega-3 acid ethyl esters (LOVAZA) 1 g capsule Take 1 capsule by mouth daily.     Probiotic Product (PROBIOTIC DAILY PO) Take by mouth.     spironolactone  (ALDACTONE ) 50 MG tablet Take 0.5 tablets (25 mg total) by mouth 2 (two) times daily. 90 tablet 3   metroNIDAZOLE  (METROGEL ) 0.75 % vaginal gel Place 1 Applicatorful vaginally 2 (two) times daily for 5 days (Patient not taking: Reported on 11/08/2023) 100 g 1   nortriptyline  (PAMELOR ) 10 MG capsule Take 1 capsule (10 mg total) by mouth at bedtime. 90 capsule 1   nortriptyline  (PAMELOR ) 25 MG capsule Take 1 capsule (25 mg total) by mouth at bedtime. 90 capsule 1   rosuvastatin  (CRESTOR ) 10 MG tablet Take 1 tablet (10 mg total) by mouth daily. 90 tablet 3   traMADol (ULTRAM) 50 MG tablet Take 50 mg by mouth every 6 (six) hours as needed. (Patient not taking: Reported on 03/27/2024)     No current facility-administered medications for this visit.    Medication Side Effects: Other: possibly see above  Allergies:  Allergies  Allergen Reactions   Aspartame And Phenylalanine Anaphylaxis   Duloxetine Hcl Other (See Comments)    Other  reaction(s): felt like a zombie Other reaction(s): felt like a zombie    Sertraline Hcl Other (See Comments)   Codeine     Bleed internally    Sulfa Antibiotics Nausea And Vomiting    Past Medical History:  Diagnosis Date   Anxiety    Arthritis    2 herniated disc lower back   Chronic lower back pain    GERD (gastroesophageal reflux disease)    Hyperlipidemia    Hypertension    Sleep apnea    mild - does not use CPAP machine   Smoker     Family History  Problem Relation Age of Onset   Heart disease Mother    Cancer Father    Stroke Sister    Heart disease Sister    Diabetes Maternal Grandmother    Breast cancer Neg Hx     Social History   Socioeconomic History   Marital status: Single    Spouse name: Not on file   Number of children: 1   Years of education: BA   Highest education level: Not on file  Occupational History   Not on file  Tobacco Use   Smoking status: Former    Current packs/day: 0.00    Average packs/day: 0.5 packs/day for 6.0 years (3.0 ttl pk-yrs)    Types: Cigarettes    Start date: 2013    Quit date: 2019    Years since quitting: 6.5   Smokeless  tobacco: Never  Vaping Use   Vaping status: Former   Quit date: 12/28/2017  Substance and Sexual Activity   Alcohol use: No    Alcohol/week: 0.0 standard drinks of alcohol    Comment: Quits 1 month ago.   Drug use: No   Sexual activity: Not Currently    Birth control/protection: Post-menopausal  Other Topics Concern   Not on file  Social History Narrative   Drinks 2-3 cups of coffee a day    Social Drivers of Health   Financial Resource Strain: Low Risk  (02/29/2024)   Received from [facility]   Overall Financial Resource Strain (CARDIA)    Difficulty of Paying Living Expenses: Not hard at all  Food Insecurity: No Food Insecurity (02/29/2024)   Received from [facility]   Hunger Vital Sign    Within the past 12 months, you worried that your food would run out before you got the  money to buy more.: Never true    Within the past 12 months, the food you bought just didn't last and you didn't have money to get more.: Never true  Transportation Needs: No Transportation Needs (02/29/2024)   Received from [facility] - Transportation    Lack of Transportation (Medical): No    Lack of Transportation (Non-Medical): No  Physical Activity: Not on file  Stress: Not on file  Social Connections: Unknown (01/29/2022)   Received from [facility]   Social Network    Social Network: Not on file  Intimate Partner Violence: Not At Risk (04/07/2023)   Received from [facility]   HITS    Over the last 12 months how often did your partner physically hurt you?: Never    Over the last 12 months how often did your partner insult you or talk down to you?: Never    Over the last 12 months how often did your partner threaten you with physical harm?: Never    Over the last 12 months how often did your partner scream or curse at you?: Never    Past Medical History, Surgical history, Social history, and Family history were reviewed and updated as appropriate.   Please see review of systems for further details on the patient's review from today.   Objective:   Physical Exam:  There were no vitals taken for this visit.  Physical Exam Constitutional:      General: She is not in acute distress.    Appearance: She is well-developed.   Musculoskeletal:        General: No deformity.   Neurological:     Mental Status: She is alert and oriented to person, place, and time.     Coordination: Coordination normal.   Psychiatric:        Attention and Perception: Attention and perception normal. She does not perceive auditory or visual hallucinations.        Mood and Affect: Mood is anxious. Mood is not depressed. Affect is not labile, blunt, angry or inappropriate.        Speech: Speech normal.        Behavior: Behavior normal.        Thought Content: Thought content normal.  Thought content is not paranoid or delusional. Thought content does not include homicidal or suicidal ideation. Thought content does not include suicidal plan.        Cognition and Memory: Cognition and memory normal.        Judgment: Judgment normal.  Comments: Depression is improved with the more stable living situation and 35 mg of nortriptyline .       Lab Review:     Component Value Date/Time   NA 136 11/08/2022 0847   K 4.7 11/08/2022 0847   CL 99 11/08/2022 0847   CO2 20 11/08/2022 0847   GLUCOSE 198 (H) 11/08/2022 0847   BUN 18 11/08/2022 0847   CREATININE 0.81 11/08/2022 0847   CALCIUM  9.7 11/08/2022 0847   PROT 6.6 11/11/2015 1005   ALBUMIN 4.7 11/11/2015 1005   AST 17 11/11/2015 1005   ALT 34 (H) 11/11/2015 1005   ALKPHOS 44 11/11/2015 1005   BILITOT 0.2 11/11/2015 1005   GFRNONAA 96 11/11/2015 1005   GFRAA 110 11/11/2015 1005       Component Value Date/Time   WBC 7.7 07/31/2020 1324   WBC 5.8 09/13/2014 1205   RBC 5.52 (H) 07/31/2020 1324   RBC 4.37 09/13/2014 1205   HGB 17.2 (H) 07/31/2020 1324   HCT 51.2 (H) 07/31/2020 1324   PLT 337 07/31/2020 1324   MCV 93 07/31/2020 1324   MCH 31.2 07/31/2020 1324   MCH 33.9 09/13/2014 1205   MCHC 33.6 07/31/2020 1324   MCHC 35.1 09/13/2014 1205   RDW 12.2 07/31/2020 1324   LYMPHSABS 1.9 07/31/2020 1324   EOSABS 0.1 07/31/2020 1324   BASOSABS 0.1 07/31/2020 1324    No results found for: POCLITH, LITHIUM   No results found for: PHENYTOIN, PHENOBARB, VALPROATE, CBMZ   Genesight testing Jan 31, 2018: SLC 6A4 intermediate response, HDR2A increased sensitivity to adverse effects, CYP enzymes as follows: 1A2 ultra-rapid metabolizer, 2 D6 intermediate metabolizer, UGT1A4 ultra-rapid, The other enzymes were normal.  Folic enzyme conversion was normal  .res Assessment: Plan:    Svea was seen today for follow-up, depression and anxiety.  Diagnoses and all orders for this visit:  Major depressive  disorder, recurrent episode, moderate (HCC) -     nortriptyline  (PAMELOR ) 10 MG capsule; Take 1 capsule (10 mg total) by mouth at bedtime. -     nortriptyline  (PAMELOR ) 25 MG capsule; Take 1 capsule (25 mg total) by mouth at bedtime.  PTSD (post-traumatic stress disorder) -     nortriptyline  (PAMELOR ) 10 MG capsule; Take 1 capsule (10 mg total) by mouth at bedtime. -     nortriptyline  (PAMELOR ) 25 MG capsule; Take 1 capsule (25 mg total) by mouth at bedtime.  Insomnia due to mental condition  Chronic pain syndrome -     nortriptyline  (PAMELOR ) 10 MG capsule; Take 1 capsule (10 mg total) by mouth at bedtime. -     nortriptyline  (PAMELOR ) 25 MG capsule; Take 1 capsule (25 mg total) by mouth at bedtime.   30 min face to face time with patient was spent on counseling and coordination of care. Extremely med sensitive.  Long history of difficulty finding a medication that she could tolerate that would help her.  Doing well with low dose nortriptyline  except ? Bladder hesitancy.   Not clear as to whether Genesight would help guide pain clinic.  She has a copy of the result.  She has tolerated nortriptyline  much better than other antidepressants.  She does not feel that it alters her personality or makes her foggy the way other antidepressants have done in the past.  She is getting some pain benefit from it as well.  Also the concussion symptoms seem to be improving either due to time or other treatment interventions or perhaps being helped by the  nortriptyline . She is satisfied with the current dosage     We discussed side effects at length and so far she is tolerating it well.   Disc extensiviely .  Disc SE Continue nortriptyline  35 mg HS DT benefit. Option reduce to see if U hesitancy is better.   She prefers no change  Option switch to [employer].  Disc SE.  She wants to defer.   (NAC) N-Acetylcysteine 2 of the  600 mg capsules daily to help with mild cognitive problems.  It can be combined  with a B-complex vitamin as the B-12 and folate which can sometimes enhance the effect.  Option memantine for memory concerns  FU 3 to 4 months as long as she is doing okay and earlier as needed  Lorene Macintosh MD, DFAPA  Please see After Visit Summary for patient specific instructions.  No future appointments.    No orders of the defined types were placed in this encounter.    -------------------------------

## 2024-04-23 ENCOUNTER — Other Ambulatory Visit: Payer: Self-pay | Admitting: Obstetrics

## 2024-04-23 DIAGNOSIS — B9689 Other specified bacterial agents as the cause of diseases classified elsewhere: Secondary | ICD-10-CM

## 2024-04-24 ENCOUNTER — Other Ambulatory Visit: Payer: Self-pay

## 2024-04-24 MED ORDER — METRONIDAZOLE 500 MG PO TABS: 500.0000 mg | ORAL_TABLET | Freq: Two times a day (BID) | ORAL | 0 refills | Status: DC

## 2024-04-27 ENCOUNTER — Other Ambulatory Visit: Payer: Self-pay | Admitting: Physician Assistant

## 2024-04-27 DIAGNOSIS — Z1231 Encounter for screening mammogram for malignant neoplasm of breast: Secondary | ICD-10-CM

## 2024-05-16 ENCOUNTER — Ambulatory Visit

## 2024-05-30 ENCOUNTER — Ambulatory Visit

## 2024-05-30 ENCOUNTER — Ambulatory Visit
Admission: RE | Admit: 2024-05-30 | Discharge: 2024-05-30 | Disposition: A | Discharge status: Home / Self Care | Source: Ambulatory Visit | Attending: Physician Assistant | Admitting: Physician Assistant

## 2024-05-30 DIAGNOSIS — Z1231 Encounter for screening mammogram for malignant neoplasm of breast: Secondary | ICD-10-CM

## 2024-06-18 ENCOUNTER — Telehealth: Payer: Self-pay | Admitting: Cardiology

## 2024-06-18 DIAGNOSIS — I10 Essential (primary) hypertension: Secondary | ICD-10-CM

## 2024-06-18 MED ORDER — SPIRONOLACTONE 50 MG PO TABS: 25.0000 mg | ORAL_TABLET | Freq: Two times a day (BID) | ORAL | 0 refills | Status: DC

## 2024-06-18 NOTE — Telephone Encounter (Signed)
 Filled

## 2024-06-18 NOTE — Telephone Encounter (Signed)
*  STAT* If patient is at the pharmacy, call can be transferred to refill team.   1. Which medications need to be refilled? (please list name of each medication and dose if known)   spironolactone  (ALDACTONE ) 50 MG tablet     4. Which pharmacy/location (including street and city if local pharmacy) is medication to be sent to? [facility] - [place], [place] - [address]    5. Do they need a 30 day or 90 day supply? 90   Pt scheduled 11/4

## 2024-07-31 ENCOUNTER — Encounter: Payer: Self-pay | Admitting: Cardiology

## 2024-07-31 ENCOUNTER — Ambulatory Visit: Attending: Cardiology | Admitting: Cardiology

## 2024-07-31 VITALS — BP 114/72 | HR 58 | Ht 63.0 in | Wt 143.0 lb

## 2024-07-31 DIAGNOSIS — I10 Essential (primary) hypertension: Secondary | ICD-10-CM | POA: Insufficient documentation

## 2024-07-31 DIAGNOSIS — I499 Cardiac arrhythmia, unspecified: Secondary | ICD-10-CM | POA: Diagnosis present

## 2024-07-31 DIAGNOSIS — R931 Abnormal findings on diagnostic imaging of heart and coronary circulation: Secondary | ICD-10-CM | POA: Diagnosis not present

## 2024-07-31 DIAGNOSIS — E7800 Pure hypercholesterolemia, unspecified: Secondary | ICD-10-CM | POA: Insufficient documentation

## 2024-07-31 DIAGNOSIS — G4733 Obstructive sleep apnea (adult) (pediatric): Secondary | ICD-10-CM | POA: Diagnosis present

## 2024-07-31 NOTE — Progress Notes (Signed)
 scalp granulating Cardiology Office Note:  .   Date:  07/31/2024  ID:  Felicia Wilcox, DOB Apr 17, 1961, MRN [id number] PCP: Felicia Elsie JINNY DEVONNA  [facility] Providers Cardiologist:  Gordy Bergamo, MD {  History of Present Illness: .   Felicia Wilcox is a 63 y.o. female with history of hypertension, elevated CAC score, hyperlipidemia, OSA, Raynaud's syndrome.    CAD CAC scoring 172 07/2022 Lexi scan with very small mild reversible defect in the basal inferior lateral region.  EF was preserved.  Completed greater than 7 METS.  Felt to be overall at low risk study without indications for invasive strategies.  Social history  Former smoker.  Quit 6 years ago. No drugs or alcohol. Lately active 1 to 2 days/week.      Patient with history of elevated CAC score 172.  She was last seen 10/2023 with complaints of orthostatic hypotension.  Thought to be related to polypharmacy and discontinuation of her memantine and nifedipine had improved her symptoms.  She had been on this chronically for memory issues and Raynaud's.  Also with history of OSA, intermittently compliant with CPAP therapy.  Additionally, she has had a recent bilateral decompression and microdiscectomy due to history of spondylosis and spine degeneration.  Today patient presents for follow-up.  She continues to report orthostatic hypotension like symptoms with positional changes and accompanied dizziness when she stands up abruptly.  She reports that she still feels like she is in her younger years and often times will just get up from a seated position.  She has not had any episodes where she passed out but does get significantly dizzy.  Otherwise she she has no other acute complaints today.  She is trying to be more active but gradually gaining back activity but with her recent spine surgery she has been going at a slow pace.   ROS: Denies: Chest pain, shortness of breath, orthopnea, peripheral edema, palpitations, decreased  exercise intolerance, fatigue, lightheadedness.   Studies Reviewed: Felicia Wilcox    EKG Interpretation Date/Time:  Tuesday July 31 2024 15:17:27 EST Ventricular Rate:  58 PR Interval:  154 QRS Duration:  84 QT Interval:  388 QTC Calculation: 380 R Axis:   2  Text Interpretation: Sinus bradycardia with sinus arrhythmia When compared with ECG of 08-Nov-2023 15:28, No significant change was found Confirmed by Darryle Currier [phone]) on 07/31/2024 3:19:57 PM    Risk Assessment/Calculations:           Physical Exam:   VS:  BP 114/72   Pulse (!) 58   Ht 5' 3 (1.6 m)   Wt 143 lb (64.9 kg)   SpO2 98%   BMI 25.33 kg/m    Wt Readings from Last 3 Encounters:  07/31/24 143 lb (64.9 kg)  11/08/23 137 lb 9.6 oz (62.4 kg)  03/23/23 142 lb 3.2 oz (64.5 kg)    GEN: Well nourished, well developed in no acute distress NECK: No JVD; No carotid bruits CARDIAC: RRR, no murmurs, rubs, gallops RESPIRATORY:  Clear to auscultation without rales, wheezing or rhonchi  ABDOMEN: Soft, non-tender, non-distended EXTREMITIES:  No edema; No deformity   ASSESSMENT AND PLAN: .    Orthostatic hypotension Continues to be a persistent issue.  She admits that she quickly and abruptly will stand up from a seated position.  Think she could be better about p.o. hydration.  Would plan for conservative management. Ensure adequate p.o. hydration, compression stockings, slower transitions. She is on spironolactone  12.5 mg twice daily.  Will drop this to 12.5 mg daily to see if this gives her a little bit more blood pressure room and improved symptoms.  Elevated CAC Hyperlipidemia CAC 172, 91 percentile.  She does not have any symptoms currently that would prompt further evaluation.  Previously had a Lexiscan  07/2022 that was felt to be overall low risk and with no indications of further invasive testing.  In the future should she have concerning features could consider coronary CTA. Continue with rosuvastatin  10 mg.  LDL goal  less than 100 seems appropriate for her.  No cardiovascular events.  OSA Compliant with CPAP now.    Hypertension Well-controlled on current therapy.  See above changes.    Dispo: 50-month follow-up with Dr. Ladona to assess orthostatic symptoms..  Signed, Thom LITTIE Sluder, PA-C

## 2024-07-31 NOTE — Patient Instructions (Signed)
 Medication Instructions:  Decrease spironolactone  to 12.5 mg one tablet daily. *If you need a refill on your cardiac medications before your next appointment, please call your pharmacy*  Lab Work: none If you have labs (blood work) drawn today and your tests are completely normal, you will receive your results only by: MyChart Message (if you have MyChart) OR A paper copy in the mail If you have any lab test that is abnormal or we need to change your treatment, we will call you to review the results.  Testing/Procedures: none  Follow-Up: At [facility], you and your health needs are our priority.  As part of our continuing mission to provide you with exceptional heart care, our providers are all part of one team.  This team includes your primary Cardiologist (physician) and Advanced Practice Providers or APPs (Physician Assistants and Nurse Practitioners) who all work together to provide you with the care you need, when you need it.  Your next appointment:   4 month(s)  Provider:   Gordy Bergamo, MD    We recommend signing up for the patient portal called MyChart.  Sign up information is provided on this After Visit Summary.  MyChart is used to connect with patients for Virtual Visits (Telemedicine).  Patients are able to view lab/test results, encounter notes, upcoming appointments, etc.  Non-urgent messages can be sent to your provider as well.   To learn more about what you can do with MyChart, go to [email or web address].   Other Instructions none

## 2024-08-02 NOTE — Progress Notes (Signed)
 [address] - AMBULATORY [facility]  - [facility] [address] [place] [address]    Date of Service: 08/02/2024 Patient Name: Felicia Wilcox Patient DOB: 03/03/1961    Assessment and Plan:  [name] was seen today for diabetes, hypertension, hyperlipidemia and bladder issue.  Diagnoses and all orders for this visit:  Essential (primary) hypertension Blood pressure is good.  No issues with medications.  No issues with low blood pressures recently.  We will continue to monitor and call if has any problems. -     Magnesium  Prediabetes Continue monitor home sugars and call with any concerns.  No change medications today.  Routine labs for monitoring. -     Comprehensive Metabolic Panel -     Hemoglobin A1C With Estimated Average Glucose  Pure hypercholesterolemia No change in regimen today will continue to monitor and adjust medications as appropriate. -     Lipid Panel  Osteopenia of lumbar spine No changes today will continue to monitor.  Continue calcium  plus D.  Irritable bowel syndrome with both constipation and diarrhea Symptoms are stable most of the time.  Bloating Liver related to the above so we will get x-ray today to see if there is anything different from previous films.  Labs today for check for infection. -     XR Abdomen 1 View; Future -     CBC with Differential  Gastroesophageal reflux disease without esophagitis  Urine frequency -     POC Urinalysis Auto without Microscopic    She verbalizes understanding and agreement of her current diagnoses, medications and therapies. All questions answered.   Medication side effects discussed with patient. Advised patient to call clinic or return for visit if these symptoms occur. The patient does not  have any barriers to taking medications as prescribed. Goals of care discussed with patient including medication adherence and adequate  follow up Relevant barriers identified and addressed: none.   Results for orders placed or performed in visit on 04/19/24  Comprehensive Metabolic Panel   Collection Time: 04/19/24  4:18 PM  Result Value Ref Range   Sodium 138 136 - 145 mmol/L   Potassium 3.9 3.5 - 5.1 mmol/L   Chloride 103 98 - 107 mmol/L   CO2 26 21 - 31 mmol/L   Anion Gap 9 6 - 14 mmol/L   Glucose, Random 95 70 - 99 mg/dL   Blood Urea Nitrogen (BUN) 12 7 - 25 mg/dL   Creatinine 9.29 9.39 - 1.20 mg/dL   eGFR >09 >40 fO/fpw/8.26f7   Albumin 4.7 3.5 - 5.7 g/dL   Total Protein 6.9 6.4 - 8.9 g/dL   Bilirubin, Total 0.3 0.3 - 1.0 mg/dL   Alkaline Phosphatase (ALP) 38 34 - 104 U/L   Aspartate Aminotransferase (AST) 18 13 - 39 U/L   Alanine Aminotransferase (ALT) 28 7 - 52 U/L   Calcium  9.7 8.6 - 10.3 mg/dL   BUN/Creatinine Ratio    TSH   Collection Time: 04/19/24  4:18 PM  Result Value Ref Range   TSH 2.547 0.450 - 5.330 uIU/mL  Lipid Panel   Collection Time: 04/19/24  4:18 PM  Result Value Ref Range   Cholesterol, Total, Lipid Panel 147 <200 mg/dL   Triglycerides, Lipid Panel 181 (H) <150 mg/dL   HDL Cholesterol - Lipid Panel 52 (L) >=60 mg/dL   LDL Cholesterol, Calculated 69 <100 mg/dL   Non-HDL Cholesterol 95 mg/dL  Hemoglobin  A1C With Estimated Average Glucose   Collection Time: 04/19/24  4:18 PM  Result Value Ref Range   Hemoglobin A1c 6.8 (H) <5.7 %   Estimated Average Glucose 148 mg/dL  CBC with Differential   Collection Time: 04/19/24  4:18 PM  Result Value Ref Range   WBC 6.80 4.40 - 11.00 10*3/uL   RBC 5.19 (H) 4.10 - 5.10 10*6/uL   Hemoglobin 16.2 (H) 12.3 - 15.3 g/dL   Hematocrit 52.1 (H) 64.0 - 44.6 %   Mean Corpuscular Volume (MCV) 92.1 80.0 - 96.0 fL   Mean Corpuscular Hemoglobin (MCH) 31.2 27.5 - 33.2 pg   Mean Corpuscular Hemoglobin Conc (MCHC) 33.8 33.0 - 37.0 g/dL   Red Cell Distribution Width (RDW) 12.4 12.3 - 17.0 %   Platelet Count (PLT) 264 150 - 450 10*3/uL   Mean Platelet  Volume (MPV) 9.2 6.8 - 10.2 fL   Neutrophils % 52 %   Lymphocytes % 36 %   Monocytes % 9 %   Eosinophils % 2 %   Basophils % 1 %   nRBC % 0 %   Neutrophils Absolute 3.50 1.80 - 7.80 10*3/uL   Lymphocytes # 2.50 1.00 - 4.80 10*3/uL   Monocytes # 0.60 0.00 - 0.80 10*3/uL   Eosinophils # 0.10 0.00 - 0.50 10*3/uL   Basophils # 0.00 0.00 - 0.20 10*3/uL   nRBC Absolute 0.00 <=0.00 10*3/uL   Return in about 4 months (around 11/30/2024) for Chronic visit. Electronically signed by: Elsie Fairy Favorite, PA-C 08/02/2024 8:34 AM   HPI:   Felicia Wilcox is a 63 y.o. female presents with Diabetes (Taking med as prescribed , not checking sugar at home), Hypertension (Taking med as prescribed , not checking BP at home), Hyperlipidemia (Taking med as prescribed, no side effects. pt last ate 45 minutes ago), and bladder issue (Pt complains of frequent urinating,  she feels full all the time.  She just had lumbar surgery October 10th.  Feels bloated and has pressure.  Lower abd pain.  No burning when urinates.  Some odor. ) Patient presents to the office today for routine visit of their multiple medical problems.  Sitting in the office today in no acute distress talking in complete sentences.  Blood pressure is good in the office.  Denies chest pain, SOB, HA, palpitations, or vision changes.   ROS:  Constitutional symptoms: negative Eyes:  negative Ear, nose, throat:  negative Cardiovascular:  negative Respiratory:  negative Gastrointestinal:  negative Genitourinary:  negative Skin:  negative Neurological:  negative Musculoskeletal:  negative Psychiatric:  negative Endocrine:  negative Hematological:  negative Allergic:  negative  Objective:   Vitals:   08/02/24 0758 08/02/24 0812  BP: (!) 165/108 142/89  Pulse: 62 60  Resp: 14   Temp: 98.7 F (37.1 C)   TempSrc: Temporal   SpO2: 100% 100%  Weight: 65 kg (143 lb 4.8 oz)   Height: 1.6 m (5' 3)     Constitutional: Well-developed  and well-nourished. Sitting comfortably conversing normally. Pleasant.  Cardiovascular: +S1S2, regular rate and rhythm, no murmurs, gallops or rubs appreciated.  Respiratory: Normal effort, clear to auscultation bilaterally. No wheezes, rales or rhonchi noted.  Psychiatric: Behavior is Cooperative and Polite. Mood euthymyic. Affect is appropriate.  Allergies Codeine, Phenylalanine, Remimazolam, Duloxetine hcl, Escitalopram, Sertraline hcl, and Sulfacetamide sodium  Current Medications[1] Problem List[2] Surgical History[3] Family History[4] Social History[5] Tobacco Use History[6]   I have reviewed and (if needed) updated the patient's problem list, medications, allergies, past  medical and surgical history, social and family history. Health Maintenance Status       Date Due Completion Dates   Depression Monitoring 10/20/2024 04/19/2024   Comprehensive Annual Visit 04/19/2025 04/19/2024, 03/25/2023   Medicare Annual Wellness Visit: Traditional Medicare 04/19/2025 04/19/2024, 04/19/2024   Diabetes Screening 04/19/2025 04/19/2024, 04/19/2024   Breast Cancer Screening (Mammogram) 05/30/2026 05/30/2024, 05/30/2024   DTaP/Tdap/Td Vaccines (6 - Td or Tdap) 03/12/2032 03/12/2022, 08/02/2018   Colorectal Cancer Screening 03/13/2033 03/16/2023, 03/16/2023       This document serves as a record of services personally performed by Elsie Favorite PA-C,  It was created on their behalf by Katheryn Gentry, RMA, a trained medical scribe, and Certified Medical Assistant (CMA). During the course of documenting the history, physical exam and medical decision making, I was functioning as a stage manager. The creation of this record is the provider's dictation and/or activities during the visit.  Electronically signed by Katheryn Gentry, RMA 08/02/2024 7:58 AM     I, Elsie Favorite, PA-C, have reviewed the scribe's documentation, personally examined the patient, added my documentation and attest it is accurate and complete.        [1] Current Outpatient Medications  Medication Sig Dispense Refill  . calc-D3-mag cit,ox-K2-herb 353 (Alive Calcium -Vitamin D3-K2) 300 mg-25 mcg- 66 mg-37.5 mcg tab Take by mouth.    . estradioL  (ESTRACE ) 0.01 % (0.1 mg/gram) vaginal cream USE 1 GRAM VAGINALLY AT BEDTIME TWICE A WEEK    . ibandronate (BONIVA) 150 mg tablet Take 1 tablet (150 mg total) by mouth every 30 (thirty) days. 6 tablet 0  . Lactobac. rhamnosus GG-inulin (CULTURELLE) 10 billion cell -200 mg cpSP Take 1 capsule by mouth daily.    . metFORMIN (GLUCOPHAGE-XR) 500 mg 24 hr tablet Take 1 tablet (500 mg total) by mouth daily with breakfast. 90 tablet 1  . nortriptyline  (PAMELOR ) 10 mg capsule Take 10 mg by mouth at bedtime.    SABRA omega 3-dha-epa-fish oil (OMEGA 3) 1,000 mg DR capsule Take 1 capsule by mouth daily.    . phosphatidylcholine, bulk, 100 % powd Take by mouth.    . rosuvastatin  (CRESTOR ) 10 mg tablet Take 10 mg by mouth daily.    . spironolactone  (ALDACTONE ) 25 mg tablet Take 25 mg by mouth Once Daily.     No current facility-administered medications for this visit.  [2] Patient Active Problem List Diagnosis  . Hallux rigidus of right foot  . Chronic midline low back pain without sciatica  . Generalized anxiety disorder  . Gastroesophageal reflux disease without esophagitis  . OSA on CPAP  . Irritable bowel syndrome with both constipation and diarrhea  . Secondary polycythemia  . Pure hypercholesterolemia  . Essential (primary) hypertension  . Vestibular migraine  . Elevated coronary artery calcium  score  . White matter disease  . Alcoholism in remission    (CMD)  . Hallux rigidus of left foot  . Vitamin D  deficiency  . Telogen effluvium  . Bilateral carpal tunnel syndrome  . Vocal cord nodule  . Raynaud phenomenon  . Prediabetes  . Osteoarthritis  . Livedo reticularis  . Muscle weakness  [3] Past Surgical History: Procedure Laterality Date  . CERVICAL DISCECTOMY    . LUMBAR DISCECTOMY   07/06/2024  . POSTERIOR FUSION CERVICAL SPINE  2011   POSTERIOR FUSION CERVICAL SPINE  [4] Family History Problem Relation Name Age of Onset  . Gout Mother    . Cancer Father    . Diabetes Maternal Grandmother    .  Multiple sclerosis Neg Hx    . Neuropathy Neg Hx    . Parkinsonism Neg Hx    . Migraines Neg Hx    . Neurofibromatosis Neg Hx    . Psoriasis Neg Hx    . Ankylosing spondylitis Neg Hx    . Lupus Neg Hx    . Rheum arthritis Neg Hx    [5] Social History Socioeconomic History  . Marital status: Single  Tobacco Use  . Smoking status: Former    Types: Cigarettes  . Smokeless tobacco: Never  Vaping Use  . Vaping status: Never Used  Substance and Sexual Activity  . Alcohol use: Not Currently    Comment: Hx of Alcohol Abuse in the past  . Drug use: No   Social Drivers of Health   Food Insecurity: Food Insecurity Present (07/24/2024)   Received from [facility]   Food vital sign   . Within the past 12 months, you worried that your food would run out before you got money to buy more: Sometimes true   . Within the past 12 months, the food you bought just didn't last and you didn't have money to get more: Sometimes true  Transportation Needs: No Transportation Needs (07/24/2024)   Received from [facility]   [facility] - Transportation   . In the past 12 months, has lack of transportation kept you from medical appointments or from getting medications?: No   . Lack of Transportation (Non-Medical): No  Safety: Not At Risk (04/07/2023)   Received from [facility]   HITS   . Over the last 12 months how often did your partner physically hurt you?: Never   . Over the last 12 months how often did your partner insult you or talk down to you?: Never   . Over the last 12 months how often did your partner threaten you with physical harm?: Never   . Over the last 12 months how often did your partner scream or curse at you?: Never  Living  Situation: Low Risk  (07/24/2024)   Received from [facility]   Living Situation   . In the last 12 months, was there a time when you were not able to pay the mortgage or rent on time?: No   . In the past 12 months, how many times have you moved where you were living?: 0   . At any time in the past 12 months, were you homeless or living in a shelter (including now)?: No  [6] Social History Tobacco Use  Smoking Status Former  . Types: Cigarettes  Smokeless Tobacco Never

## 2024-08-11 ENCOUNTER — Telehealth: Payer: Self-pay | Admitting: Student

## 2024-08-11 NOTE — Telephone Encounter (Signed)
   Patient called Answering Service with concerns about her BP.  Called and spoke with patient.  She has a history of orthostatic hypotension.  She was recently seen by Emery Sluder, PA-C, on 07/31/2024 and Spironolactone  was decreased from 25mg  daily to 12.5 mg daily.  However, she does not feel like this has helped.  She continues to have significant orthostatic symptoms when changing positions including near syncope.  She denies any loss of consciousness.  She also states she takes Nebivolol  20 mg daily.  This is not on her medication list but she states she has been taking this for a while.  She states she looked at the side effects of this and she has everyone of them so she thinks this is the cause.  She is on max dose Nebivolol  so I did not recommend that she stop this completely.  Recommended she decrease to 10 mg daily to see if that helps.  She has not tried compression stockings so I recommended she try this.  Also recommended staying well-hydrated, liberal salt intake, and elevating legs when possible.  Recommend she try this for a few days and then let us  know if she has not had any improvement.  Reviewed ED precautions.  Patient voiced understanding thanked for calling.  Of note, I will add Nebivolol  back to her med list so that our list is accurate.   Seward Coran E Tamara Kenyon, PA-C 08/11/2024 4:49 PM

## 2024-09-17 ENCOUNTER — Emergency Department (HOSPITAL_BASED_OUTPATIENT_CLINIC_OR_DEPARTMENT_OTHER)

## 2024-09-17 ENCOUNTER — Telehealth: Payer: Self-pay | Admitting: Cardiology

## 2024-09-17 ENCOUNTER — Other Ambulatory Visit: Payer: Self-pay

## 2024-09-17 ENCOUNTER — Encounter (HOSPITAL_BASED_OUTPATIENT_CLINIC_OR_DEPARTMENT_OTHER): Payer: Self-pay

## 2024-09-17 ENCOUNTER — Emergency Department (HOSPITAL_BASED_OUTPATIENT_CLINIC_OR_DEPARTMENT_OTHER)
Admission: EM | Admit: 2024-09-17 | Discharge: 2024-09-17 | Disposition: A | Discharge status: Home / Self Care | Attending: Emergency Medicine | Admitting: Emergency Medicine

## 2024-09-17 ENCOUNTER — Telehealth: Payer: Self-pay | Admitting: Psychiatry

## 2024-09-17 DIAGNOSIS — I10 Essential (primary) hypertension: Secondary | ICD-10-CM | POA: Diagnosis not present

## 2024-09-17 DIAGNOSIS — Z79899 Other long term (current) drug therapy: Secondary | ICD-10-CM | POA: Diagnosis not present

## 2024-09-17 DIAGNOSIS — Z7984 Long term (current) use of oral hypoglycemic drugs: Secondary | ICD-10-CM | POA: Insufficient documentation

## 2024-09-17 DIAGNOSIS — R519 Headache, unspecified: Secondary | ICD-10-CM | POA: Diagnosis present

## 2024-09-17 DIAGNOSIS — E119 Type 2 diabetes mellitus without complications: Secondary | ICD-10-CM | POA: Insufficient documentation

## 2024-09-17 LAB — CBC WITH DIFFERENTIAL/PLATELET
Abs Immature Granulocytes: 0.03 K/uL (ref 0.00–0.07)
Basophils Absolute: 0 K/uL (ref 0.0–0.1)
Basophils Relative: 1 %
Eosinophils Absolute: 0.1 K/uL (ref 0.0–0.5)
Eosinophils Relative: 2 %
HCT: 41.6 % (ref 36.0–46.0)
Hemoglobin: 14.6 g/dL (ref 12.0–15.0)
Immature Granulocytes: 0 %
Lymphocytes Relative: 30 %
Lymphs Abs: 2.3 K/uL (ref 0.7–4.0)
MCH: 31.3 pg (ref 26.0–34.0)
MCHC: 35.1 g/dL (ref 30.0–36.0)
MCV: 89.3 fL (ref 80.0–100.0)
Monocytes Absolute: 0.4 K/uL (ref 0.1–1.0)
Monocytes Relative: 6 %
Neutro Abs: 4.7 K/uL (ref 1.7–7.7)
Neutrophils Relative %: 61 %
Platelets: 300 K/uL (ref 150–400)
RBC: 4.66 MIL/uL (ref 3.87–5.11)
RDW: 11.9 % (ref 11.5–15.5)
WBC: 7.6 K/uL (ref 4.0–10.5)
nRBC: 0 % (ref 0.0–0.2)

## 2024-09-17 LAB — COMPREHENSIVE METABOLIC PANEL WITH GFR
ALT: 54 U/L — ABNORMAL HIGH (ref 0–44)
AST: 30 U/L (ref 15–41)
Albumin: 4.4 g/dL (ref 3.5–5.0)
Alkaline Phosphatase: 43 U/L (ref 38–126)
Anion gap: 14 (ref 5–15)
BUN: 13 mg/dL (ref 8–23)
CO2: 24 mmol/L (ref 22–32)
Calcium: 9.5 mg/dL (ref 8.9–10.3)
Chloride: 103 mmol/L (ref 98–111)
Creatinine, Ser: 0.82 mg/dL (ref 0.44–1.00)
GFR, Estimated: >60 mL/min
Glucose, Bld: 171 mg/dL — ABNORMAL HIGH (ref 70–99)
Potassium: 3.9 mmol/L (ref 3.5–5.1)
Sodium: 140 mmol/L (ref 135–145)
Total Bilirubin: 0.2 mg/dL (ref 0.0–1.2)
Total Protein: 6.6 g/dL (ref 6.5–8.1)

## 2024-09-17 MED ORDER — LOSARTAN POTASSIUM 25 MG PO TABS: 25.0000 mg | ORAL_TABLET | Freq: Every day | ORAL | 0 refills | Status: AC

## 2024-09-17 MED ORDER — LOSARTAN POTASSIUM 25 MG PO TABS
25.0000 mg | ORAL_TABLET | Freq: Once | ORAL | Status: AC
  Administered 2024-09-17: 25 mg via ORAL
  Filled 2024-09-17: qty 1

## 2024-09-17 NOTE — Telephone Encounter (Signed)
 She has been on the same dose of nortriptyline  for a long time.  It would not cause a sudden increase in BP at this time.  She can reduce the nortriptyline  to 25 mg daily and stop the 10 mg dose, but that won't solve the problem.  She needs to see a PCP to manage her BP.  She could get more dep with the lower dose to be aware.  But once dep managed let us  know how she is doing./

## 2024-09-17 NOTE — Telephone Encounter (Signed)
 She called at 1:35p.  She said she needs an urgent call back.  She can't get her blood pressure to go down.  She thinks it might be the nortriptyline .  She wants to know how much she can go down on it.  She has a call in to her cardiologist and to there primary care doc.

## 2024-09-17 NOTE — Discharge Instructions (Addendum)
 Please stop taking Nebivolol  and spironolactone . Take losartan  25 mg daily   Follow-up with your doctor in several weeks to recheck your blood pressure.  Please continue to keep a detailed blood pressure log  Return to ER if you have blood pressure greater than 200 or dizziness or headache or passing out

## 2024-09-17 NOTE — Telephone Encounter (Signed)
 Pt c/o BP issue: STAT if pt c/o blurred vision, one-sided weakness or slurred speech  1. What are your last 5 BP readings? 160s-180s/97-105  2. Are you having any other symptoms (ex. Dizziness, headache, blurred vision, passed out)? Patient states yes to all of the symptoms   3. What is your BP issue?

## 2024-09-17 NOTE — Telephone Encounter (Signed)
 Spoke to pt on the phone. Pt reports elevated BP readings ranging from 160/95 to 180/105. Pt reports episodes of dizziness, headaches, blurred vision, and states she has almost passed out at times. Pt reports she does not check her BP consistently, only every no and then. Pt states her neurologist attributed visual changes to migraines. Pt reports she was seen in November by the NP, where her spironolactone  was decreased due to orthostatic hypotension. Pt advised to go to the ER due to current symptoms to be further evaluated. Pt agreed and states she will have someone drive her.

## 2024-09-17 NOTE — ED Provider Notes (Signed)
 " [place] [facility] Provider Note   CSN: [id number] Arrival date & time: 09/17/24  8365     Patient presents with: Hypertension   Felicia Wilcox is a 63 y.o. female history of hypertension, diabetes, here presenting with dizziness and hypertension.  Patient has been having hypertension since beginning of November.  Patient saw her doctor at that time and her blood pressure was normal.  She then developed some dizziness and headache and was thought to have side effect of Nebivolol .  Her Nebivolol  was cut in half and was told to take it in the morning.  Patient also has been taking spironolactone  as well.  Patient has been taking detailed blood pressure records and most of the time blood pressure is in the 160s to 170s in the morning.  Sometimes drops down to about 140s in the afternoon.  Patient has called her primary care doctor and psychiatrist and cardiologist and was unable to get in for appointments so came here for further evaluation.  Patient denies any chest pain or shortness of breath   The history is provided by the patient.       Prior to Admission medications  Medication Sig Start Date End Date Taking? Authorizing Provider  Acetylcysteine (N-ACETYL-L-CYSTEINE PO) 600 mg daily   Yes [provider]  alendronate (FOSAMAX) 70 MG tablet Take 70 mg by mouth once a week. 09/05/24  Yes [provider]  metFORMIN (GLUCOPHAGE-XR) 500 MG 24 hr tablet Take 500 mg by mouth every morning. 07/16/24  Yes [provider]  Nebivolol  HCl 20 MG TABS Take 10 mg by mouth daily.   Yes [provider]  nortriptyline  (PAMELOR ) 10 MG capsule Take 1 capsule (10 mg total) by mouth at bedtime. 03/27/24  Yes Wilcox, Felicia KANDICE Raddle., MD  nortriptyline  (PAMELOR ) 25 MG capsule Take 1 capsule (25 mg total) by mouth at bedtime. 03/27/24  Yes Wilcox, Felicia KANDICE Raddle., MD  omega-3 acid ethyl esters (LOVAZA) 1 g capsule Take 1 capsule by mouth daily.    Yes [provider]  OVER THE COUNTER MEDICATION Phosphatidyl choline 840 mg daily   Yes [provider]  OVER THE COUNTER MEDICATION Vitamin d3 125mg  daily   Yes [provider]  OVER THE COUNTER MEDICATION EOA + DHA 500 mg daily   Yes [provider]  Probiotic Product (PROBIOTIC DAILY PO) Take by mouth.   Yes [provider]  spironolactone  (ALDACTONE ) 25 MG tablet Take 12.5 mg by mouth daily. 10/28/22  Yes [provider]  rosuvastatin  (CRESTOR ) 10 MG tablet Take 1 tablet (10 mg total) by mouth daily. 11/08/23 07/31/24  Felicia Heinz, MD  traMADol (ULTRAM) 50 MG tablet Take 50 mg by mouth every 6 (six) hours as needed.    [provider]  VOQUEZNA 20 MG TABS Take 1 tablet by mouth daily. 08/09/24   [provider]    Allergies: Aspartame and phenylalanine, Duloxetine hcl, Sertraline hcl, Codeine, and Sulfa antibiotics    Review of Systems  Neurological:  Positive for dizziness.  All other systems reviewed and are negative.   Updated Vital Signs BP (!) 179/87   Pulse 77   Temp 97.8 F (36.6 C)   Resp 16   Ht 5' 3 (1.6 m)   Wt 63.5 kg   SpO2 98%   BMI 24.80 kg/m   Physical Exam Vitals and nursing note reviewed.  Constitutional:      Comments: Slightly anxious  HENT:  Head: Normocephalic.     Nose: Nose normal.     Mouth/Throat:     Mouth: Mucous membranes are moist.  Eyes:     Extraocular Movements: Extraocular movements intact.     Pupils: Pupils are equal, round, and reactive to light.  Cardiovascular:     Rate and Rhythm: Regular rhythm.     Heart sounds: Normal heart sounds.  Pulmonary:     Effort: Pulmonary effort is normal.     Breath sounds: Normal breath sounds.  Abdominal:     General: Abdomen is flat.     Palpations: Abdomen is soft.  Musculoskeletal:        General: Normal range of motion.     Cervical back: Normal range of motion and neck supple.  Skin:    General: Skin is warm.      Capillary Refill: Capillary refill takes less than 2 seconds.  Neurological:     General: No focal deficit present.     Mental Status: She is alert and oriented to person, place, and time.  Psychiatric:        Mood and Affect: Mood normal.        Behavior: Behavior normal.     (all labs ordered are listed, but only abnormal results are displayed) Labs Reviewed  CBC WITH DIFFERENTIAL/PLATELET  COMPREHENSIVE METABOLIC PANEL WITH GFR    EKG: None  Radiology: No results found.   Procedures   Medications Ordered in the ED  losartan  (COZAAR ) tablet 25 mg (25 mg Oral Given 09/17/24 1837)                                    Medical Decision Making Felicia Wilcox is a 63 y.o. female who presenting with headache and dizziness.  Symptoms been going on intermittently for the last several months.  I have reviewed her blood pressure records and most the time she is in the 160s to 170s.  I think she likely has symptomatic hypertension.  We discussed options including increasing Nebivolol  versus changing to a different medication.  She states that she is concerned she has side effects of Nebivolol  and wants to change to a different medication.  I told her that we can start low-dose losartan .  Will check basic blood work and CT head as well   8:29 PM Blood pressure is down to the 130/80.  CT head and labs unremarkable.  Will switch to losartan  and discontinue Nebivolol  and spironolactone    Problems Addressed: Hypertension, unspecified type: chronic illness or injury with exacerbation, progression, or side effects of treatment  Amount and/or Complexity of Data Reviewed Labs: ordered. Decision-making details documented in ED Course. Radiology: ordered and independent interpretation performed. Decision-making details documented in ED Course.  Risk Prescription drug management.     Final diagnoses:  None    ED Discharge Orders     None          [name],  MD 09/17/24 2030  "

## 2024-09-17 NOTE — Telephone Encounter (Signed)
 Pt reporting elevated BP, ranging 200/100. 07/31/24 it was 114/72. 10/28 it was 170/87. She has called multiple providers today to ask about meds. She is only taking nortriptyline  10 and 25 mg prescribed by you. She has been on this since at least 2023. She said she tends to have SE after she had been on a medication long-term.

## 2024-09-17 NOTE — ED Triage Notes (Addendum)
 Pt states that she is having issues with her blood pressure. States that she has hx of HTN that is getting worse over the last few months.    Having some dizziness at time when BP is elevated  States that Systolic has been in the 180-200's

## 2024-09-18 NOTE — Telephone Encounter (Signed)
 Recommendations reviewed with the patient.

## 2024-10-02 ENCOUNTER — Ambulatory Visit: Admitting: Psychiatry

## 2024-10-02 ENCOUNTER — Encounter: Payer: Self-pay | Admitting: Psychiatry

## 2024-10-02 VITALS — BP 129/86 | HR 100

## 2024-10-02 DIAGNOSIS — F4310 Post-traumatic stress disorder, unspecified: Secondary | ICD-10-CM

## 2024-10-02 DIAGNOSIS — G894 Chronic pain syndrome: Secondary | ICD-10-CM | POA: Diagnosis not present

## 2024-10-02 DIAGNOSIS — F331 Major depressive disorder, recurrent, moderate: Secondary | ICD-10-CM | POA: Diagnosis not present

## 2024-10-02 DIAGNOSIS — F5105 Insomnia due to other mental disorder: Secondary | ICD-10-CM

## 2024-10-02 NOTE — Progress Notes (Signed)
 REBACCA VOTAW [id number] 1961-04-18 64 y.o.   Subjective:   Patient ID:  Felicia Wilcox is a 64 y.o. (DOB [birth year]) female.  Chief Complaint:  Chief Complaint  Patient presents with   Follow-up   Depression   Anxiety   Sleeping Problem    HPI TAMELLA TUCCILLO presents to the office today for follow-up of depression and anxiety and chronic pain problems.    November 2019.  No meds were changed. Continued Fetzima  40mg .   10/2019 appt. Covid free.  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  Stress isolation.  Family far away.  Conflict over politics with friends and family.  Feels like everyone is in the same place.  Not really depression.  Fetzima  helps pain. Patient reports stable mood and denies  irritable moods. Little dips into depression and wonders if there is going to be medication options for her given her history of med sensitivity..  Not gracefully aging.  Patient denies any recent difficulty with anxiety.  Patient denies difficulty with sleep initiation or maintenance with hydroxyzine .   Denies appetite disturbance.  Patient reports that energy and motivation have been good.  Patient denies any difficulty with concentration.  Patient denies any suicidal ideation.  Struggles never successful getting into a career.    05/22/21 appt noted: Stayed on Fetzima  regularly.  Mirtazapine for sleep once or twice monthly and rare hydroxyzine . Life thrown me a whole lot. B died of alcoholism in 11-Oct-2019.  She went to take care of things. Got Covid there and lots of family.  Got [employer] and has long haul sx with relentless  medical problems since then.  Brain fog and anxiety at times. Had a lot of medical work up at [facility]. Wants to take a trial without Fetzima  for awhile to see how she feels and also bc of some of the urinary hesitancy. Episodic anxiety and it is better now. Plan: per her request taper Fetzima  by taking 20 mg alternating with 40 mg every other day for 2-4 weeks, Then reduce to  20 mg daily for 2-4 weeks, Then reduce to 20 mg every other day for 2 weeks and then stop  11/23/21 appt moved up per her request: Off Fetzima . PT job in [employer] for a couple of years and box fell Jul 16 2021 and gave her concussion and workman's comp and other problems since then.  A lot of physical issues with it. Including migraines and dizziness and tingling in arms and legs.  Hard to stand for long.   More forgetful.   It has ruined me.  Seeing neurologist who gave her some meds.   Has taken trazodone or mirtazapine for sleep.   Workman's comp case denied. Can't do physical activities she did before. Work was good for her physically.  But can't do it the way she did before but needs to work until 64 yo. Is in counseling, Tessa at .  She dx PTSD Has to look for another place to live DT $ px. Plan: No meds RX We discussed the recent accident 06/2021 with concussion and the problems with getting it resolved.    12/23/2021 phone call: Patient says she is not motivated to do anything, her house is a mess, and she is eating all the time. Her quality of life is poor due to her physical condition. She is inside the majority of the time. She denies SI but says she thinks about not waking up. She feels like the concussion she had  in October has caused some neurologic changes. She has migraines and vertigo as a result of the concussion. Her spondylosis doesn't help.  She is taking trazodone from one of her doctors and said she sleeps well. She also uses a CPAP but says she is so tired in the morning. Due to urinary hesitancy she can't take the Fetzima . She said GeneSight testing suggested another medication, but she doesn't remember what it was.  01/19/22 MD repsones: She had the Genesight test may 2019.  Because it is a genetic test that does not change so there is no point to repeating the test.  Unfortunately it does not tell you which medicines are going to work it only suggest which medicines you are  likely to have problems tolerating.  She is very med sensitive and has failed multiple medications.  I do not want to change medicines over the phone I need to see her for an appointment before I am going to want to change any medications.  You can put her on the cancellation list if she is not already on it  02/24/2022 appointment with the following noted: Trazodone works for sleep without hangover. Concussion ever changing.  Thinks anxiety is a part of the concussion.   Been through a lot in last couple of years.  Has attorney over the concussion case and is a lot of work. Chronic depression and pain. Plan: She agrees to trial of nortriptyline  suspension starting at 2 mg and increasing as tolerated to 10 mg for pain and depression.  03/25/2022 appointment with the following noted: Thinks nortriptyline  helping pain and some depression despite horrific battle with worker's comp, MCR etc. Mediation is on July 7.   Feels like she'll collapse any day. No se SE. Vertigo and HA from concussion. On nortriptyline  3 ml (10mg /26ml).  Wonders if she could go up in the dose. Asked if can take Lyrica and nortriptyline . Still needs trazodone for sleep.  No suicidal thoughts. Plan: She agrees to continue trial of nortriptyline  suspension increase to 4 mL daily for about 4 days then if tolerated increase to 5 mL daily.  04/26/2022 phone call: She had increased nortriptyline  suspension to 6 mL daily.  That was equivalent to 12 mg of nortriptyline .  She wanted to gradually to convert to a capsule so we talked about a plan to gradually increase the liquid until she got to 12 mL nightly at which point we could convert to a capsule. She had some questions about whether nortriptyline  might be affecting her blood pressure and she was encouraged to continue monitoring it.  06/16/2022 appointment noted: Up to nortriptyline  10 ml for a couple of weeks. Tolerating it very well.  No mind fog from it like other  antidepressants. Feels it has helped some with the pain and mood is pretty good.  Getting better from concussion finally.  Mirgraines and vertigo less and better function.  Trying to exercise some. Still on Aimovig.  Seeing neuro. Some anxiety chronically.   Life situation is a little more stable and that has helped. SE dryness, but manageable. Not taking trazodone right now. Plan: Switch nortriptyline  from 10 mL of suspension to 25 mg capsule 1 nightly. She can take trazodone as needed at the low dose that is typically used for sleep 50 mg.  07/29/22 TC:  Patient called to say that the 25 mg tablet of nortriptyline  doesn't seem to be working as well as the suspension did. She said the suspension helped with depression and  pain much better. She is asking to go back to the liquid.    Take 15 mLs (30 mg total) by mouth at bedtime. Dispense: 473 mL, Refills: 0 ordered  This looks like the last dosing.     MD resp:  She may be right bc the capsule is 20% lower dose than the liquid.  So with the liquid she was getting 30 mg and now with capsule 25 mg.  Using the capsules we could add 10 mg capsule with the 25 mg capusle to equal 35 mg daily or we can go back to the liquid 30 mg .  Whichever she prefers.     09/28/22 appt noted: Taking 35 mg nortriptyline  daily. Perfect I'm doing great on it.  Pleased with nortriptyline .  Helping her sleep. She quit smoking and also gained some weight and wonders if it is nortriptyline .   Now much better job and better paid.  Not doing physical work.  Is able to exercise  now.  Less tragedy and stress going on now.   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  Works for [employer].   Hard to say how much is related to the med and how much is related to the situation.   Is lonely.  But doesn't want drama either.  Satisfied with meds. Plan: Continue nortriptyline  35 mg HS DT benefit. She can take trazodone as needed at the low dose that is  typically used for sleep 50 mg.  08/29/23 appt noted: Taking 35 mg nortriptyline  HS. Has counselor.  Says she catastrophisizes.   Situational anxiety.  Sometimes can't think straight and some panic and more anxious than should be.  Not all the time.  Doesn't want scheduled med.  Wants prn and before took hydroxyzine .   Saw pain clinic and wants something for pain.   Feels memory is worse since Covid twice and concussion.  Don't communicate like she used to do, formulation of sentences, staying on subject.   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   alprazolam caused negative interaction with her.    Asks about whether Genesight testing could guide pain clinic selecting meds. Plan: add prn hydroxyzine   03/27/24 appt noted:  Med: 35 mg nortriptyline  HS.  Also on buprenorphine 10 mcg patch  Has DJD.  Patch helped with pain.  Has recommended surgery but she's getting third opinion. Has gotten 2 diff recommendations for different surgeries.   Tired of being alone.  Single for a long time.    SE U hesitation.  But could be an issue with bladder from pinched nerves.   Pain patch helps lower pain if doesn't do anything to aggrivate back.   Pain can interfere with sleep.   09/17/24 TC:  Pt reporting elevated BP, ranging 200/100. 07/31/24 it was 114/72. 10/28 it was 170/87. She has called multiple providers today to ask about meds. She is only taking nortriptyline  10 and 25 mg prescribed by you. She has been on this since at least 2023. She said she tends to have SE after she had been on a medication long-term.     MD resp:  She has been on the same dose of nortriptyline  for a long time.  It would not cause a sudden increase in BP at this time.  She can reduce the nortriptyline  to 25 mg daily and stop the 10 mg dose, but that won't solve the problem.  She needs to  see a PCP to manage her BP.  She could get more dep with the lower dose to be aware.  But once dep managed let  us  know how she is doing./    Lorene Macintosh, MD, DFAPA  10/02/24 appt noted:  Med: nortriptyline  35 mg HS.  Losartan  25 mg daily, metformin 500 BID. Called to check about BP and nortriptyline  bc recent rise in BP.   She suspects it bc over time she tends to get more SE over time.   Was doing pretty well emotionally for awhile.   Been on disability but will turn 64 yo next year.  Worries over it.  BC costs will go up for her end of this year.  Worries over finances end of the year.   Only family is her son 42 yo single.  Has moved to [facility] and has a GS.  Disc some social stressors around this which makes her feel more insecure about the future.   Thinking about anxiety meds.   Back pain much better after surgery since here.  Decompression worked.   Sees doc about BP end of Jan.    Disability since July 2020.  Can only have 22 doctor visits per year.  Chronic $ stress.   Past Psychiatric Medication Trials:  Fetzima , duloxetine, sertraline abnormal periods, fluoxetine side effects, paroxetine brief side effects, citalopram, Lexapro 15, venlafaxine flat,  Mirtazapine ? effect, nefazodone,  Nortriptyline  35 mg  Wellbutrin no response buspirone,  Gabapentin Lyrica 100 stoned but helped Hydroxyzine , Ambien, Sonata, trazodone Provigil, lithium brief,  Pindolol,  Chantix Donepezil NM Med sensitive.  remimazolam caused negative interaction with her.     Review of Systems:  Review of Systems  Constitutional:  Positive for unexpected weight change.       Sweats  Genitourinary:  Positive for difficulty urinating.  Musculoskeletal:  Positive for arthralgias and neck pain. Negative for back pain.  Neurological:  Positive for headaches. Negative for dizziness and tremors.  Psychiatric/Behavioral:  Negative for decreased concentration. The patient is nervous/anxious.     Medications: I have reviewed the patient's current medications.  Current Outpatient Medications  Medication Sig Dispense  Refill   losartan  (COZAAR ) 25 MG tablet Take 1 tablet (25 mg total) by mouth daily. 60 tablet 0   metFORMIN (GLUCOPHAGE-XR) 500 MG 24 hr tablet Take 500 mg by mouth every morning.     nortriptyline  (PAMELOR ) 10 MG capsule Take 1 capsule (10 mg total) by mouth at bedtime. 90 capsule 1   nortriptyline  (PAMELOR ) 25 MG capsule Take 1 capsule (25 mg total) by mouth at bedtime. 90 capsule 1   rosuvastatin  (CRESTOR ) 10 MG tablet Take 1 tablet (10 mg total) by mouth daily. 90 tablet 3   Acetylcysteine (N-ACETYL-L-CYSTEINE PO) 600 mg daily     alendronate (FOSAMAX) 70 MG tablet Take 70 mg by mouth once a week.     Nebivolol  HCl 20 MG TABS Take 10 mg by mouth daily. (Patient not taking: Reported on 10/02/2024)     omega-3 acid ethyl esters (LOVAZA) 1 g capsule Take 1 capsule by mouth daily.     OVER THE COUNTER MEDICATION Phosphatidyl choline 840 mg daily     OVER THE COUNTER MEDICATION Vitamin d3 125mg  daily     OVER THE COUNTER MEDICATION EOA + DHA 500 mg daily     Probiotic Product (PROBIOTIC DAILY PO) Take by mouth.     spironolactone  (ALDACTONE ) 25 MG tablet Take 12.5 mg by mouth daily. (Patient not taking:  Reported on 10/02/2024)     traMADol (ULTRAM) 50 MG tablet Take 50 mg by mouth every 6 (six) hours as needed.     VOQUEZNA 20 MG TABS Take 1 tablet by mouth daily.     No current facility-administered medications for this visit.    Medication Side Effects: Other: possibly see above  Allergies:  Allergies  Allergen Reactions   Aspartame And Phenylalanine Anaphylaxis   Duloxetine Hcl Other (See Comments)    Other reaction(s): felt like a zombie Other reaction(s): felt like a zombie    Sertraline Hcl Other (See Comments)   Codeine     Bleed internally    Sulfa Antibiotics Nausea And Vomiting    Past Medical History:  Diagnosis Date   Anxiety    Arthritis    2 herniated disc lower back   Chronic lower back pain    GERD (gastroesophageal reflux disease)    Hyperlipidemia     Hypertension    Sleep apnea    mild - does not use CPAP machine   Smoker     Family History  Problem Relation Age of Onset   Heart disease Mother    Cancer Father    Stroke Sister    Heart disease Sister    Diabetes Maternal Grandmother    Breast cancer Neg Hx     Social History   Socioeconomic History   Marital status: Single    Spouse name: Not on file   Number of children: 1   Years of education: BA   Highest education level: Not on file  Occupational History   Not on file  Tobacco Use   Smoking status: Former    Current packs/day: 0.00    Average packs/day: 0.5 packs/day for 6.0 years (3.0 ttl pk-yrs)    Types: Cigarettes    Start date: 2013    Quit date: 2019    Years since quitting: 7.0   Smokeless tobacco: Never  Vaping Use   Vaping status: Former   Quit date: 12/28/2017  Substance and Sexual Activity   Alcohol use: No    Alcohol/week: 0.0 standard drinks of alcohol    Comment: Quits 1 month ago.   Drug use: No   Sexual activity: Not Currently    Birth control/protection: Post-menopausal  Other Topics Concern   Not on file  Social History Narrative   Drinks 2-3 cups of coffee a day    Social Drivers of Health   Tobacco Use: Medium Risk (10/02/2024)   Patient History    Smoking Tobacco Use: Former    Smokeless Tobacco Use: Never    Passive Exposure: Not on file  Financial Resource Strain: High Risk (07/24/2024)   Received from [facility]   Overall Financial Resource Strain (CARDIA)    Difficulty of Paying Living Expenses: Hard  Food Insecurity: Food Insecurity Present (07/24/2024)   Received from [facility]   Epic    Within the past 12 months, you worried that your food would run out before you got the money to buy more.: Sometimes true    Within the past 12 months, the food you bought just didn't last and you didn't have money to get more.: Sometimes true  Transportation Needs: No Transportation Needs  (07/24/2024)   Received from [facility]    In the past 12 months, has lack of transportation kept you from medical appointments or from getting medications?: No  Lack of Transportation (Non-Medical): No  Physical Activity: Not on file  Stress: Not on file  Social Connections: Not on file  Intimate Partner Violence: Not At Risk (04/07/2023)   Received from [facility]   HITS    Over the last 12 months how often did your partner physically hurt you?: Never    Over the last 12 months how often did your partner insult you or talk down to you?: Never    Over the last 12 months how often did your partner threaten you with physical harm?: Never    Over the last 12 months how often did your partner scream or curse at you?: Never  Depression (PHQ2-9): Low Risk (02/15/2023)   Depression (PHQ2-9)    PHQ-2 Score: 1  Alcohol Screen: Not on file  Housing: Low Risk  (07/24/2024)   Received from [facility]   Epic    In the last 12 months, was there a time when you were not able to pay the mortgage or rent on time?: No    In the past 12 months, how many times have you moved where you were living?: 0    At any time in the past 12 months, were you homeless or living in a shelter (including now)?: No  Utilities: Not At Risk (07/24/2024)   Received from [facility]   Epic    In the past 12 months has the electric, gas, oil, or water company threatened to shut off services in your home?: No  Health Literacy: Not on file    Past Medical History, Surgical history, Social history, and Family history were reviewed and updated as appropriate.   Please see review of systems for further details on the patient's review from today.   Objective:   Physical Exam:  BP 129/86   Pulse 100   Physical Exam Constitutional:      General: She is not in acute distress.    Appearance: She is well-developed.  Musculoskeletal:         General: No deformity.  Neurological:     Mental Status: She is alert and oriented to person, place, and time.     Coordination: Coordination normal.  Psychiatric:        Attention and Perception: Attention and perception normal. She does not perceive auditory or visual hallucinations.        Mood and Affect: Mood is anxious. Mood is not depressed. Affect is not labile, blunt, angry or inappropriate.        Speech: Speech normal.        Behavior: Behavior normal.        Thought Content: Thought content normal. Thought content is not paranoid or delusional. Thought content does not include homicidal or suicidal ideation. Thought content does not include suicidal plan.        Cognition and Memory: Cognition and memory normal.        Judgment: Judgment normal.     Comments: Depression is improved with the more stable living situation and 35 mg of nortriptyline .       Lab Review:     Component Value Date/Time   NA 140 09/17/2024 1841   NA 136 11/08/2022 0847   K 3.9 09/17/2024 1841   CL 103 09/17/2024 1841   CO2 24 09/17/2024 1841   GLUCOSE 171 (H) 09/17/2024 1841   BUN 13 09/17/2024 1841   BUN 18 11/08/2022 0847   CREATININE 0.82 09/17/2024 1841   CALCIUM   9.5 09/17/2024 1841   PROT 6.6 09/17/2024 1841   PROT 6.6 11/11/2015 1005   ALBUMIN 4.4 09/17/2024 1841   ALBUMIN 4.7 11/11/2015 1005   AST 30 09/17/2024 1841   ALT 54 (H) 09/17/2024 1841   ALKPHOS 43 09/17/2024 1841   BILITOT 0.2 09/17/2024 1841   BILITOT 0.2 11/11/2015 1005   GFRNONAA >60 09/17/2024 1841   GFRAA 110 11/11/2015 1005       Component Value Date/Time   WBC 7.6 09/17/2024 1841   RBC 4.66 09/17/2024 1841   HGB 14.6 09/17/2024 1841   HGB 17.2 (H) 07/31/2020 1324   HCT 41.6 09/17/2024 1841   HCT 51.2 (H) 07/31/2020 1324   PLT 300 09/17/2024 1841   PLT 337 07/31/2020 1324   MCV 89.3 09/17/2024 1841   MCV 93 07/31/2020 1324   MCH 31.3 09/17/2024 1841   MCHC 35.1 09/17/2024 1841   RDW 11.9  09/17/2024 1841   RDW 12.2 07/31/2020 1324   LYMPHSABS 2.3 09/17/2024 1841   LYMPHSABS 1.9 07/31/2020 1324   MONOABS 0.4 09/17/2024 1841   EOSABS 0.1 09/17/2024 1841   EOSABS 0.1 07/31/2020 1324   BASOSABS 0.0 09/17/2024 1841   BASOSABS 0.1 07/31/2020 1324    No results found for: POCLITH, LITHIUM   No results found for: PHENYTOIN, PHENOBARB, VALPROATE, CBMZ   Genesight testing Jan 31, 2018: SLC 6A4 intermediate response, HDR2A increased sensitivity to adverse effects, CYP enzymes as follows: 1A2 ultra-rapid metabolizer, 2 D6 intermediate metabolizer, UGT1A4 ultra-rapid, The other enzymes were normal.  Folic enzyme conversion was normal  .res Assessment: Plan:    Carling was seen today for follow-up, depression, anxiety and sleeping problem.  Diagnoses and all orders for this visit:  Major depressive disorder, recurrent episode, moderate (HCC)  PTSD (post-traumatic stress disorder)  Insomnia due to mental condition  Chronic pain syndrome    30 min face to face time with patient was spent on counseling and coordination of care. Extremely med sensitive.  Long history of difficulty finding a medication that she could tolerate that would help her.  Doing well with low dose nortriptyline  except ? Bladder hesitancy.   Not clear as to whether Genesight would help guide pain clinic.  She has a copy of the result.  She has tolerated nortriptyline  much better than other antidepressants.  She does not feel that it alters her personality or makes her foggy the way other antidepressants have done in the past.  She is getting some pain benefit from it as well.  Also the concussion symptoms seem to be improving either due to time or other treatment interventions or perhaps being helped by the nortriptyline . She is satisfied with the current dosage     We discussed side effects at length and so far she is tolerating it well.   Disc extensiviely .  Disc SE including it's NE  effect. Continue nortriptyline  35 mg HS DT benefit. Rec against change bc limited med options.  See if BP can be managed.  Option desipramine.  Option switch to [employer].  Disc SE.  She wants to defer. Cost likely to be a problem.  (NAC) N-Acetylcysteine 2 of the  600 mg capsules daily to help with mild cognitive problems.  It can be combined with a B-complex vitamin as the B-12 and folate which can sometimes enhance the effect.  Option memantine for memory concerns  Asks about emotional support letter redo.  FU 3 to 4 months as long as she is doing okay and  earlier as needed  Lorene Macintosh MD, DFAPA  Please see After Visit Summary for patient specific instructions.  Future Appointments  Date Time Provider Department Center  12/04/2024  2:40 PM Ladona Heinz, MD [facility]      No orders of the defined types were placed in this encounter.    -------------------------------

## 2024-12-04 ENCOUNTER — Ambulatory Visit: Admitting: Cardiology

## 2025-01-01 ENCOUNTER — Ambulatory Visit: Admitting: Psychiatry
